# Patient Record
Sex: Male | Born: 1955 | Race: White | Hispanic: No | Marital: Married | Smoking: Never smoker
Health system: Southern US, Community
[De-identification: ages and names within clinical notes are randomized; demographics above are authoritative.]

## PROBLEM LIST (undated history)

## (undated) DIAGNOSIS — R011 Cardiac murmur, unspecified: Secondary | ICD-10-CM

## (undated) DIAGNOSIS — E1165 Type 2 diabetes mellitus with hyperglycemia: Secondary | ICD-10-CM

## (undated) DIAGNOSIS — E1121 Type 2 diabetes mellitus with diabetic nephropathy: Secondary | ICD-10-CM

## (undated) DIAGNOSIS — IMO0002 Reserved for concepts with insufficient information to code with codable children: Secondary | ICD-10-CM

## (undated) DIAGNOSIS — E1110 Type 2 diabetes mellitus with ketoacidosis without coma: Secondary | ICD-10-CM

## (undated) DIAGNOSIS — I5030 Unspecified diastolic (congestive) heart failure: Secondary | ICD-10-CM

## (undated) DIAGNOSIS — N183 Chronic kidney disease, stage 3 (moderate): Secondary | ICD-10-CM

## (undated) DIAGNOSIS — R079 Chest pain, unspecified: Secondary | ICD-10-CM

## (undated) DIAGNOSIS — T7840XA Allergy, unspecified, initial encounter: Secondary | ICD-10-CM

## (undated) DIAGNOSIS — E1140 Type 2 diabetes mellitus with diabetic neuropathy, unspecified: Secondary | ICD-10-CM

## (undated) DIAGNOSIS — R002 Palpitations: Secondary | ICD-10-CM

## (undated) DIAGNOSIS — I10 Essential (primary) hypertension: Secondary | ICD-10-CM

## (undated) DIAGNOSIS — I493 Ventricular premature depolarization: Secondary | ICD-10-CM

## (undated) DIAGNOSIS — Z8669 Personal history of other diseases of the nervous system and sense organs: Secondary | ICD-10-CM

## (undated) HISTORY — DX: Type 2 diabetes mellitus with diabetic neuropathy, unspecified: E11.40

## (undated) HISTORY — PX: FOOT SURGERY: SHX648

## (undated) HISTORY — DX: Morbid (severe) obesity due to excess calories: E66.01

## (undated) HISTORY — DX: Palpitations: R00.2

## (undated) HISTORY — DX: Chronic kidney disease, stage 3 (moderate): N18.3

## (undated) HISTORY — DX: Allergy, unspecified, initial encounter: T78.40XA

## (undated) HISTORY — DX: Type 2 diabetes mellitus with hyperglycemia: E11.65

## (undated) HISTORY — DX: Unspecified diastolic (congestive) heart failure: I50.30

## (undated) HISTORY — DX: Essential (primary) hypertension: I10

## (undated) HISTORY — DX: Reserved for concepts with insufficient information to code with codable children: IMO0002

## (undated) HISTORY — DX: Type 2 diabetes mellitus with diabetic nephropathy: E11.21

## (undated) HISTORY — DX: Cardiac murmur, unspecified: R01.1

## (undated) HISTORY — DX: Personal history of other diseases of the nervous system and sense organs: Z86.69

## (undated) HISTORY — DX: Chest pain, unspecified: R07.9

## (undated) HISTORY — DX: Ventricular premature depolarization: I49.3

---

## 2007-08-27 ENCOUNTER — Ambulatory Visit: Payer: Self-pay | Admitting: Family Medicine

## 2010-08-01 LAB — PSA: PSA: 0.1

## 2010-08-27 ENCOUNTER — Ambulatory Visit: Payer: Self-pay

## 2012-01-15 LAB — TSH: TSH: 2.65 u[IU]/mL (ref <=5.90)

## 2012-01-15 LAB — HEMOGLOBIN A1C: HEMOGLOBIN A1C: 5.7

## 2013-02-24 LAB — BASIC METABOLIC PANEL
BUN: 16 mg/dL (ref 4–21)
Creatinine: 1.3 mg/dL (ref 0.6–1.3)
Glucose: 81 mg/dL
POTASSIUM: 3.8 mmol/L (ref 3.4–5.3)
SODIUM: 141 mmol/L (ref 137–147)

## 2013-03-03 DIAGNOSIS — R252 Cramp and spasm: Secondary | ICD-10-CM | POA: Insufficient documentation

## 2013-03-03 DIAGNOSIS — K625 Hemorrhage of anus and rectum: Secondary | ICD-10-CM | POA: Insufficient documentation

## 2013-03-03 LAB — CBC AND DIFFERENTIAL
HCT: 42 % (ref 41–53)
Hemoglobin: 14.3 g/dL (ref 13.5–17.5)
Neutrophils Absolute: 5 /uL
Platelets: 268 10*3/uL (ref 150–399)
WBC: 9.2 10^3/mL

## 2013-06-30 LAB — LIPID PANEL
Cholesterol: 159 mg/dL (ref 0–200)
HDL: 29 mg/dL — AB (ref 35–70)
LDL Cholesterol: 97 mg/dL
Triglycerides: 163 mg/dL — AB (ref 40–160)

## 2013-06-30 LAB — HEPATIC FUNCTION PANEL
ALT: 20 U/L (ref 10–40)
AST: 19 U/L (ref 14–40)
Alkaline Phosphatase: 94 U/L (ref 25–125)
Bilirubin, Direct: 0.19 mg/dL (ref 0.01–0.4)
Bilirubin, Total: 0.7 mg/dL

## 2013-09-15 HISTORY — PX: OTHER SURGICAL HISTORY: SHX169

## 2014-01-31 DIAGNOSIS — G629 Polyneuropathy, unspecified: Secondary | ICD-10-CM

## 2014-01-31 HISTORY — DX: Polyneuropathy, unspecified: G62.9

## 2014-06-28 ENCOUNTER — Ambulatory Visit: Payer: Self-pay | Admitting: Nurse Practitioner

## 2014-06-28 LAB — SYNOVIAL CELL COUNT + DIFF, W/ CRYSTALS
BASOS ABS: 0 %
Crystals, Joint Fluid: NONE SEEN
Eosinophil: 0 %
Lymphocytes: 31 %
NEUTROS PCT: 2 %
Nucleated Cell Count: 273 /mm3
OTHER MONONUCLEAR CELLS: 67 %
Other Cells BF: 0 %

## 2014-07-04 LAB — BODY FLUID CULTURE

## 2016-01-02 DIAGNOSIS — R252 Cramp and spasm: Secondary | ICD-10-CM

## 2016-01-02 DIAGNOSIS — K625 Hemorrhage of anus and rectum: Secondary | ICD-10-CM

## 2016-01-02 DIAGNOSIS — I10 Essential (primary) hypertension: Secondary | ICD-10-CM

## 2016-01-02 DIAGNOSIS — M109 Gout, unspecified: Secondary | ICD-10-CM

## 2016-01-08 ENCOUNTER — Ambulatory Visit: Payer: Self-pay | Admitting: Urology

## 2016-01-08 VITALS — BP 147/78 | HR 67 | Wt 259.0 lb

## 2016-01-08 DIAGNOSIS — E785 Hyperlipidemia, unspecified: Secondary | ICD-10-CM

## 2016-01-08 DIAGNOSIS — E1159 Type 2 diabetes mellitus with other circulatory complications: Secondary | ICD-10-CM | POA: Insufficient documentation

## 2016-01-08 DIAGNOSIS — I10 Essential (primary) hypertension: Secondary | ICD-10-CM

## 2016-01-08 DIAGNOSIS — R351 Nocturia: Secondary | ICD-10-CM

## 2016-01-08 DIAGNOSIS — R7303 Prediabetes: Secondary | ICD-10-CM

## 2016-01-08 MED ORDER — AMLODIPINE BESYLATE 10 MG PO TABS: 10.0000 mg | ORAL_TABLET | Freq: Every day | ORAL | Status: DC

## 2016-01-08 MED ORDER — METOPROLOL TARTRATE 100 MG PO TABS: 100.0000 mg | ORAL_TABLET | Freq: Two times a day (BID) | ORAL | Status: DC

## 2016-01-08 MED ORDER — ATORVASTATIN CALCIUM 20 MG PO TABS: 20.0000 mg | ORAL_TABLET | Freq: Every day | ORAL | Status: DC

## 2016-01-08 MED ORDER — LOSARTAN POTASSIUM 50 MG PO TABS: 50.0000 mg | ORAL_TABLET | Freq: Every day | ORAL | Status: DC

## 2016-01-08 MED ORDER — ALLOPURINOL 100 MG PO TABS: 100.0000 mg | ORAL_TABLET | Freq: Every day | ORAL | Status: DC

## 2016-01-08 MED ORDER — SILODOSIN 8 MG PO CAPS: 8.0000 mg | ORAL_CAPSULE | Freq: Every day | ORAL | Status: DC

## 2016-01-08 MED ORDER — CLONIDINE HCL 0.1 MG PO TABS: 0.1000 mg | ORAL_TABLET | Freq: Two times a day (BID) | ORAL | Status: DC

## 2016-01-08 NOTE — Progress Notes (Signed)
       Patient: Steve Buck Male    DOB: November 27, 1955   60 y.o.   MRN: [id number] Visit Date: 01/08/2016  Today's Provider: [place]   Chief Complaint  Patient presents with  . Hypertension    needs refill of medications   Subjective:    Hypertension This is a chronic problem. The current episode started more than 1 year ago. The problem is unchanged. The problem is uncontrolled. Associated symptoms include peripheral edema. Pertinent negatives include no anxiety, blurred vision, chest pain, headaches, malaise/fatigue, neck pain, orthopnea, palpitations, PND, shortness of breath or sweats. There are no associated agents to hypertension. Risk factors for coronary artery disease include dyslipidemia, male gender, obesity, sedentary lifestyle and family history. Past treatments include beta blockers and calcium channel blockers. The current treatment provides moderate improvement. Compliance problems include medication cost.    Patient has not been seen at the [facility] for the last two years.  He had been seeing a PCP in a private practice.  He last saw Dr. Vickki Muff in November 2016.  Has not been out of medications.  He has not seen an eye doctor in one year.    Patient complains of nocturia x 3.  Has risk factors for sleep apnea.  Has not had a sleep study.      Allergies  Allergen Reactions  . Lisinopril    Previous Medications   ALLOPURINOL (ZYLOPRIM) 100 MG TABLET    Take 100 mg by mouth daily.   AMLODIPINE (NORVASC) 10 MG TABLET    Take 10 mg by mouth daily.   CLONIDINE (CATAPRES) 0.1 MG TABLET    Take 0.1 mg by mouth 2 (two) times daily.   LOSARTAN (COZAAR) 50 MG TABLET    Take 50 mg by mouth daily.   LOVASTATIN (MEVACOR) 20 MG TABLET    Take 20 mg by mouth at bedtime.   METOPROLOL (LOPRESSOR) 100 MG TABLET    Take 100 mg by mouth 2 (two) times daily.    Review of Systems  Constitutional: Negative for malaise/fatigue.  Eyes: Negative for blurred vision.    Respiratory: Negative for shortness of breath.   Cardiovascular: Negative for chest pain, palpitations, orthopnea and PND.  Musculoskeletal: Negative for neck pain.  Neurological: Negative for headaches.    Social History  Substance Use Topics  . Smoking status: Never Smoker   . Smokeless tobacco: Not on file  . Alcohol Use: Yes   Objective:   BP 147/78 mmHg  Pulse 67  Wt 259 lb (117.482 kg)  Physical Exam  Constitutional: He appears well-developed and well-nourished.  Cardiovascular: Normal rate, regular rhythm and normal heart sounds.   Pulmonary/Chest: Effort normal and breath sounds normal.        Assessment & Plan:     1. HTN:  -meds refilled  -check TSH, Lipids, CMP, Homocysteine, HbgA1c, CBC  -follow up one month  2. HLD:   -check lipids  -follow up one month  3. Nocturia:  -start Rapaflo  -check PSA  -follow up one month   -if no improvement, consider sleep study, [place] patient have a difficult time getting CPAP machines      [facility]

## 2016-01-24 ENCOUNTER — Ambulatory Visit: Payer: Self-pay

## 2016-02-12 ENCOUNTER — Other Ambulatory Visit: Payer: Self-pay

## 2016-02-12 DIAGNOSIS — R7303 Prediabetes: Secondary | ICD-10-CM

## 2016-02-12 DIAGNOSIS — E785 Hyperlipidemia, unspecified: Secondary | ICD-10-CM

## 2016-02-12 DIAGNOSIS — I10 Essential (primary) hypertension: Secondary | ICD-10-CM

## 2016-02-14 LAB — COMPREHENSIVE METABOLIC PANEL
ALK PHOS: 84 IU/L (ref 39–117)
ALT: 15 IU/L (ref 0–44)
AST: 19 IU/L (ref 0–40)
Albumin/Globulin Ratio: 1.3 (ref 1.2–2.2)
Albumin: 3.9 g/dL (ref 3.6–4.8)
BILIRUBIN TOTAL: 0.7 mg/dL (ref 0.0–1.2)
BUN/Creatinine Ratio: 15 (ref 10–24)
BUN: 18 mg/dL (ref 8–27)
CHLORIDE: 99 mmol/L (ref 96–106)
CO2: 25 mmol/L (ref 18–29)
Calcium: 9.2 mg/dL (ref 8.6–10.2)
Creatinine, Ser: 1.22 mg/dL (ref 0.76–1.27)
GFR calc Af Amer: 74 mL/min/{1.73_m2} (ref >59)
GFR calc non Af Amer: 64 mL/min/{1.73_m2} (ref >59)
GLUCOSE: 95 mg/dL (ref 65–99)
Globulin, Total: 3.1 g/dL (ref 1.5–4.5)
Potassium: 4 mmol/L (ref 3.5–5.2)
Sodium: 141 mmol/L (ref 134–144)
TOTAL PROTEIN: 7 g/dL (ref 6.0–8.5)

## 2016-02-14 LAB — TSH: TSH: 5.9 u[IU]/mL — ABNORMAL HIGH (ref 0.450–4.500)

## 2016-02-14 LAB — PSA, TOTAL AND FREE
PSA FREE PCT: 46.7 %
PSA FREE: 0.14 ng/mL
Prostate Specific Ag, Serum: 0.3 ng/mL (ref 0.0–4.0)

## 2016-02-14 LAB — LIPID PANEL
CHOL/HDL RATIO: 5.4 ratio — AB (ref 0.0–5.0)
Cholesterol, Total: 135 mg/dL (ref 100–199)
HDL: 25 mg/dL — ABNORMAL LOW (ref >39)
LDL CALC: 62 mg/dL (ref 0–99)
TRIGLYCERIDES: 242 mg/dL — AB (ref 0–149)
VLDL Cholesterol Cal: 48 mg/dL — ABNORMAL HIGH (ref 5–40)

## 2016-02-14 LAB — CBC WITH DIFFERENTIAL/PLATELET
BASOS ABS: 0 10*3/uL (ref 0.0–0.2)
Basos: 0 %
EOS (ABSOLUTE): 0.3 10*3/uL (ref 0.0–0.4)
Eos: 3 %
Hematocrit: 41.6 % (ref 37.5–51.0)
Hemoglobin: 13.6 g/dL (ref 12.6–17.7)
IMMATURE GRANS (ABS): 0 10*3/uL (ref 0.0–0.1)
Immature Granulocytes: 0 %
LYMPHS: 36 %
Lymphocytes Absolute: 3.4 10*3/uL — ABNORMAL HIGH (ref 0.7–3.1)
MCH: 31.6 pg (ref 26.6–33.0)
MCHC: 32.7 g/dL (ref 31.5–35.7)
MCV: 97 fL (ref 79–97)
Monocytes Absolute: 0.8 10*3/uL (ref 0.1–0.9)
Monocytes: 8 %
NEUTROS ABS: 5 10*3/uL (ref 1.4–7.0)
NEUTROS PCT: 53 %
PLATELETS: 224 10*3/uL (ref 150–379)
RBC: 4.31 x10E6/uL (ref 4.14–5.80)
RDW: 14.7 % (ref 12.3–15.4)
WBC: 9.5 10*3/uL (ref 3.4–10.8)

## 2016-02-14 LAB — HOMOCYSTEINE: Homocysteine: 16.1 umol/L — ABNORMAL HIGH (ref 0.0–15.0)

## 2016-02-14 LAB — HEMOGLOBIN A1C
Est. average glucose Bld gHb Est-mCnc: 160 mg/dL
HEMOGLOBIN A1C: 7.2 % — AB (ref 4.8–5.6)

## 2016-02-15 ENCOUNTER — Telehealth: Payer: Self-pay

## 2016-02-15 DIAGNOSIS — E039 Hypothyroidism, unspecified: Secondary | ICD-10-CM

## 2016-02-15 MED ORDER — LEVOTHYROXINE SODIUM 50 MCG PO TABS: 50.0000 ug | ORAL_TABLET | Freq: Every day | ORAL | Status: DC

## 2016-02-15 NOTE — Progress Notes (Signed)
I called patient scheduled his lab follow up and sent the RX to [facility] for Synthroid 50 mcg daily. Patient has an appt on 02/19/16 encouraged him to ask questions about the new medication at that time.

## 2016-02-17 NOTE — Telephone Encounter (Signed)
It wasn't in my co-sign box.

## 2016-02-19 ENCOUNTER — Ambulatory Visit: Payer: Self-pay | Admitting: Urology

## 2016-02-19 VITALS — BP 138/74 | HR 61 | Temp 98.2°F | Wt 308.0 lb

## 2016-02-19 DIAGNOSIS — E039 Hypothyroidism, unspecified: Secondary | ICD-10-CM

## 2016-02-19 MED ORDER — LEVOTHYROXINE SODIUM 50 MCG PO TABS: 50.0000 ug | ORAL_TABLET | Freq: Every day | ORAL | Status: DC

## 2016-02-19 NOTE — Progress Notes (Signed)
Patient: Steve Buck Male    DOB: 10/30/1955   60 y.o.   MRN: [id number] Visit Date: 02/19/2016  Today's Provider: [place]   Chief Complaint  Patient presents with  . Follow-up    labwork    Subjective:    Hypertension This is a chronic problem. The current episode started more than 1 year ago. The problem is unchanged. The problem is uncontrolled. Associated symptoms include peripheral edema. Pertinent negatives include no anxiety, blurred vision, chest pain, headaches, malaise/fatigue, neck pain, orthopnea, palpitations, PND, shortness of breath or sweats. There are no associated agents to hypertension. Risk factors for coronary artery disease include dyslipidemia, male gender, obesity, sedentary lifestyle and family history. Past treatments include beta blockers and calcium channel blockers. The current treatment provides moderate improvement. Compliance problems include medication cost.    Patient has not been seen at the [facility] for the last two years.  He had been seeing a PCP in a private practice.  He last saw Dr. Vickki Muff in November 2016.  Has not been out of medications.  He has not seen an eye doctor in one year.    Patient complains of nocturia x 3.  Has risk factors for sleep apnea.  Has not had a sleep study.  Rapaflo was not helpful.  He experienced ejaculatory disorders.    Wife states he has "funky looking" moles on his back.    Allergies  Allergen Reactions  . Lisinopril    Previous Medications   ALLOPURINOL (ZYLOPRIM) 100 MG TABLET    Take 1 tablet (100 mg total) by mouth daily.   AMLODIPINE (NORVASC) 10 MG TABLET    Take 1 tablet (10 mg total) by mouth daily.   ATORVASTATIN (LIPITOR) 20 MG TABLET    Take 1 tablet (20 mg total) by mouth daily.   CLONIDINE (CATAPRES) 0.1 MG TABLET    Take 1 tablet (0.1 mg total) by mouth 2 (two) times daily.   LEVOTHYROXINE (SYNTHROID, LEVOTHROID) 50 MCG TABLET    Take 1 tablet (50 mcg total) by mouth daily  before breakfast.   LOSARTAN (COZAAR) 50 MG TABLET    Take 1 tablet (50 mg total) by mouth daily.   LOVASTATIN (MEVACOR) 20 MG TABLET    Take 20 mg by mouth at bedtime. Reported on 02/19/2016   METOPROLOL (LOPRESSOR) 100 MG TABLET    Take 1 tablet (100 mg total) by mouth 2 (two) times daily.   SILODOSIN (RAPAFLO) 8 MG CAPS CAPSULE    Take 1 capsule (8 mg total) by mouth daily with breakfast.    Review of Systems  Constitutional: Negative for malaise/fatigue.  Eyes: Negative for blurred vision.  Respiratory: Negative for shortness of breath.   Cardiovascular: Negative for chest pain, palpitations, orthopnea and PND.  Musculoskeletal: Negative for neck pain.  Neurological: Negative for headaches.    Social History  Substance Use Topics  . Smoking status: Never Smoker   . Smokeless tobacco: Not on file  . Alcohol Use: Yes   Objective:   BP 138/74 mmHg  Pulse 61  Temp(Src) 98.2 F (36.8 C)  Wt 308 lb (139.708 kg)  Physical Exam  Constitutional: He appears well-developed and well-nourished.  Cardiovascular: Normal rate, regular rhythm and normal heart sounds.   Pulmonary/Chest: Effort normal and breath sounds normal.   Back:  Three moles of new onset; irregular and multi-colored     Assessment & Plan:     1. HTN:  -meds refilled  -  check TSH was 5.900-start synthroid, Lipids, CMP wnl, Homocysteine elevated at 16.1 , HbgA1c 7.2%, CBC wnl  -follow up one month  2. HLD:   -check lipids; elevated; hypothyroidism start Synthroid; recheck in 6 weeks   3. Nocturia:  -start Rapaflo; no improvement  -check PSA  -if no improvement, consider sleep study, [place] patient have a difficult time getting CPAP machines, will not do sleep study    4. Hypothyroidism:  -start synthroid 50 mcg and recheck TSH in 6 weeks  5. Suspicious moles;  Refer to dermatology   [facility]

## 2016-04-01 ENCOUNTER — Other Ambulatory Visit: Payer: Self-pay

## 2016-04-03 ENCOUNTER — Other Ambulatory Visit: Payer: Self-pay

## 2016-04-03 DIAGNOSIS — E038 Other specified hypothyroidism: Secondary | ICD-10-CM

## 2016-04-04 LAB — TSH: TSH: 1.71 u[IU]/mL (ref 0.450–4.500)

## 2017-01-07 ENCOUNTER — Other Ambulatory Visit: Payer: Self-pay

## 2017-01-07 DIAGNOSIS — I10 Essential (primary) hypertension: Secondary | ICD-10-CM

## 2017-01-07 MED ORDER — METOPROLOL TARTRATE 100 MG PO TABS: 100.0000 mg | ORAL_TABLET | Freq: Two times a day (BID) | ORAL | 0 refills | Status: DC

## 2017-01-13 ENCOUNTER — Other Ambulatory Visit: Payer: Self-pay

## 2017-01-13 DIAGNOSIS — I10 Essential (primary) hypertension: Secondary | ICD-10-CM

## 2017-01-13 MED ORDER — METOPROLOL TARTRATE 100 MG PO TABS: 100.0000 mg | ORAL_TABLET | Freq: Two times a day (BID) | ORAL | 0 refills | Status: DC

## 2017-01-13 MED ORDER — CLONIDINE HCL 0.1 MG PO TABS: 0.1000 mg | ORAL_TABLET | Freq: Two times a day (BID) | ORAL | 0 refills | Status: DC

## 2017-01-15 ENCOUNTER — Emergency Department: Payer: Self-pay

## 2017-01-15 ENCOUNTER — Emergency Department
Admission: EM | Admit: 2017-01-15 | Discharge: 2017-01-15 | Disposition: A | Discharge status: Home / Self Care | Payer: Self-pay | Attending: Emergency Medicine | Admitting: Emergency Medicine

## 2017-01-15 DIAGNOSIS — Y999 Unspecified external cause status: Secondary | ICD-10-CM | POA: Insufficient documentation

## 2017-01-15 DIAGNOSIS — Y939 Activity, unspecified: Secondary | ICD-10-CM | POA: Insufficient documentation

## 2017-01-15 DIAGNOSIS — S60221A Contusion of right hand, initial encounter: Secondary | ICD-10-CM

## 2017-01-15 DIAGNOSIS — I110 Hypertensive heart disease with heart failure: Secondary | ICD-10-CM | POA: Insufficient documentation

## 2017-01-15 DIAGNOSIS — W228XXA Striking against or struck by other objects, initial encounter: Secondary | ICD-10-CM | POA: Insufficient documentation

## 2017-01-15 DIAGNOSIS — Y929 Unspecified place or not applicable: Secondary | ICD-10-CM | POA: Insufficient documentation

## 2017-01-15 DIAGNOSIS — I10 Essential (primary) hypertension: Secondary | ICD-10-CM

## 2017-01-15 DIAGNOSIS — S62646A Nondisplaced fracture of proximal phalanx of right little finger, initial encounter for closed fracture: Secondary | ICD-10-CM | POA: Insufficient documentation

## 2017-01-15 DIAGNOSIS — I509 Heart failure, unspecified: Secondary | ICD-10-CM | POA: Insufficient documentation

## 2017-01-15 MED ORDER — NAPROXEN 500 MG PO TABS: 500.0000 mg | ORAL_TABLET | Freq: Two times a day (BID) | ORAL | 0 refills | Status: DC

## 2017-01-15 NOTE — ED Notes (Signed)
See triage note  States he hit his right hand on a pallet on Monday  Developed increased pain yesterday

## 2017-01-15 NOTE — ED Provider Notes (Signed)
[facility] Emergency Department Provider Note   ____________________________________________   First MD Initiated Contact with Patient 01/15/17 [phone]     (approximate)  I have reviewed the triage vital signs and the nursing notes.   HISTORY  Chief Complaint Hand Pain    HPI Steve Buck is a 61 y.o. male is here complaining of right hand pain for the last 3 days. Patient thinks that he hit a pallet on Monday but is not completely sure. 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 He has continued to have pain in his hand since that time. He took over-the-counter ibuprofen with minimal relief. He denies any previous injury to his hand. He rates pain as a 10 over 10.   Past Medical History:  Diagnosis Date  . Allergy    Seasonal  . CHF (congestive heart failure) ([place])   . Hypertension     Patient Active Problem List   Diagnosis Date Noted  . Pre-diabetes 01/08/2016  . Essential hypertension 01/08/2016  . Hyperlipemia 01/08/2016  . Nocturia 01/08/2016  . Hypertension 03/03/2013  . Rectal bleeding 03/03/2013  . Leg cramps 03/03/2013  . Gout 11/25/2012    Past Surgical History:  Procedure Laterality Date  . FOOT SURGERY  around 1994    Prior to Admission medications   Medication Sig Start Date End Date Taking? Authorizing Provider  cloNIDine (CATAPRES) 0.1 MG tablet Take 1 tablet (0.1 mg total) by mouth 2 (two) times daily. 01/13/17   Nori Riis, PA-C  lovastatin (MEVACOR) 20 MG tablet Take 20 mg by mouth at bedtime. Reported on 02/19/2016    Historical Provider, MD  metoprolol (LOPRESSOR) 100 MG tablet Take 1 tablet (100 mg total) by mouth 2 (two) times daily. 01/13/17   Nori Riis, PA-C  naproxen (NAPROSYN) 500 MG tablet Take 1 tablet (500 mg total) by mouth 2 (two) times daily with a meal. 01/15/17   Johnn Hai, PA-C    Allergies Lisinopril  Family History  Problem Relation Age  of Onset  . Cancer Mother     Skin  . CAD Father   . Arthritis Father     Social History Social History  Substance Use Topics  . Smoking status: Never Smoker  . Smokeless tobacco: Not on file  . Alcohol use Yes    Review of Systems Constitutional: No fever/chills ENT: No sore throat. Cardiovascular: Denies chest pain. Respiratory: Denies shortness of breath. Gastrointestinal:   No nausea, no vomiting.   Musculoskeletal: Positive right hand pain. Skin: Negative for rash. Neurological: Negative for headaches, focal weakness or numbness.   ____________________________________________   PHYSICAL EXAM:  VITAL SIGNS: ED Triage Vitals  Enc Vitals Group     BP 01/15/17 0717 (!) 145/107     Pulse Rate 01/15/17 0717 (!) 56     Resp 01/15/17 0717 18     Temp 01/15/17 0717 97.9 F (36.6 C)     Temp Source 01/15/17 0717 Oral     SpO2 01/15/17 0717 97 %     Weight 01/15/17 0718 (!) 315 lb (142.9 kg)     Height 01/15/17 0718 6' (1.829 m)     Head Circumference --      Peak Flow --      Pain Score 01/15/17 0717 10     Pain Loc --      Pain Edu? --      Excl. in [place]? --  Constitutional: Alert and oriented. Well appearing and in no acute distress. Eyes: Conjunctivae are normal. PERRL. EOMI. Head: Atraumatic. Neck: No stridor.   Cardiovascular: Normal rate, regular rhythm. Grossly normal heart sounds.  Good peripheral circulation. Respiratory: Normal respiratory effort.  No retractions. Lungs CTAB. Musculoskeletal: On examination of the right hand there is no gross deformity noted. Range of motion is decreased in the digits secondary to discomfort. There is generalized tenderness on palpation of the third, fourth and fifth digit along with the metacarpal bones in this area as well. Skin is intact. No ecchymosis or abrasions were seen. Capillary refill less than 3 seconds. Motor sensory function intact. Neurologic:  Normal speech and language. No gross focal neurologic  deficits are appreciated. No gait instability. Skin:  Skin is warm, dry and intact. No rash noted. Psychiatric: Mood and affect are normal. Speech and behavior are normal.  ____________________________________________   LABS (all labs ordered are listed, but only abnormal results are displayed)  Labs Reviewed - No data to display  RADIOLOGY  Right hand x-ray per radiologist: IMPRESSION:  Nondisplaced fracture distal aspect fifth proximal phalanx. No other  evidence of fracture. No dislocation. 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   I, Johnn Hai, personally viewed and evaluated these images (plain radiographs) as part of my medical decision making, as well as reviewing the written report by the radiologist.  Right wrist x-ray per radiologist is negative for fracture or dislocation. ____________________________________________   PROCEDURES  Procedure(s) performed: None  Procedures  Critical Care performed: No  ____________________________________________   INITIAL IMPRESSION / ASSESSMENT AND PLAN / ED COURSE  Pertinent labs & imaging results that were available during my care of the patient were reviewed by me and considered in my medical decision making (see chart for details).  Patient states that he has been out of his blood pressure medication for approximately one week. He states that he only started taking it again a few days ago. [facility] He was made aware that he does have fracture of his fifth digit. Patient was placed in a metal finger splint for protection. Patient was discharged with a prescription for naproxen 500 mg twice a day. He is encouraged to continue taking his blood pressure medication every day.      ____________________________________________   FINAL CLINICAL IMPRESSION(S) / ED DIAGNOSES  Final diagnoses:  Closed nondisplaced fracture of proximal phalanx of right little finger, initial  encounter  Contusion of right hand, initial encounter  Hypertension, poor control      NEW MEDICATIONS STARTED DURING THIS VISIT:  Discharge Medication List as of 01/15/2017  9:10 AM    START taking these medications   Details  naproxen (NAPROSYN) 500 MG tablet Take 1 tablet (500 mg total) by mouth 2 (two) times daily with a meal., Starting Thu 01/15/2017, Print         Note:  This document was prepared using Dragon voice recognition software and may include unintentional dictation errors.    Johnn Hai, PA-C 01/15/17 [place], MD 01/15/17 [phone]

## 2017-01-15 NOTE — Discharge Instructions (Signed)
Wear splint for support and [facility]  about your blood pressure.   Ice and elevate your hand as needed for pain/   Naproxen twice a day with food for pain and inflammation.

## 2017-01-15 NOTE — ED Triage Notes (Signed)
Pt arrives to ER via POC c/o right hand pain X 3 days after hitting it on desk. No obvious bruising or deformity. Worsening pain over last few days. Pt alert and oriented X4, active, cooperative, pt in NAD. RR even and unlabored, color WNL.

## 2017-01-20 ENCOUNTER — Ambulatory Visit: Payer: Self-pay | Admitting: Adult Health Nurse Practitioner

## 2017-01-20 VITALS — BP 150/85 | HR 72 | Temp 98.1°F | Wt 314.7 lb

## 2017-01-20 DIAGNOSIS — M109 Gout, unspecified: Secondary | ICD-10-CM

## 2017-01-20 DIAGNOSIS — Z0000 Encounter for general adult medical examination without abnormal findings: Secondary | ICD-10-CM

## 2017-01-20 DIAGNOSIS — I10 Essential (primary) hypertension: Secondary | ICD-10-CM

## 2017-01-20 MED ORDER — METOPROLOL TARTRATE 100 MG PO TABS: 100.0000 mg | ORAL_TABLET | Freq: Two times a day (BID) | ORAL | 2 refills | Status: DC

## 2017-01-20 MED ORDER — CLONIDINE HCL 0.2 MG PO TABS: 0.1000 mg | ORAL_TABLET | Freq: Two times a day (BID) | ORAL | 3 refills | Status: DC

## 2017-01-20 MED ORDER — ALLOPURINOL 100 MG PO TABS: 100.0000 mg | ORAL_TABLET | Freq: Every day | ORAL | 3 refills | Status: DC

## 2017-01-20 NOTE — Progress Notes (Signed)
  Patient: Steve Buck Male    DOB: [birth year]   61 y.o.   MRN: [id number] Visit Date: 01/20/2017  Today's Provider: Staci Acosta, NP   No chief complaint on file.  Subjective:    HPI   Pt seen in the ER for nondisplaced fracture distal aspect of the 5th proximal phalanx.  On naproxen for pain.  Pt states that he thinks it was gout flare.  Not taking allopurinol.   HTN/HLD:  Taking clonidine 0.1mg  BID and Metoprolol 100mg  BID- has not had medications for 5 days.  Not taking lovastatin.  Not monitoring diet or exercise.   Pt states that he is having nocturia, 5-6 times per night.   Allergies  Allergen Reactions  . Lisinopril    Previous Medications   ALLOPURINOL (ZYLOPRIM) 100 MG TABLET    Take 100 mg by mouth daily.   CLONIDINE (CATAPRES) 0.1 MG TABLET    Take 1 tablet (0.1 mg total) by mouth 2 (two) times daily.   LOVASTATIN (MEVACOR) 20 MG TABLET    Take 20 mg by mouth at bedtime. Reported on 02/19/2016   METOPROLOL (LOPRESSOR) 100 MG TABLET    Take 1 tablet (100 mg total) by mouth 2 (two) times daily.   NAPROXEN (NAPROSYN) 500 MG TABLET    Take 1 tablet (500 mg total) by mouth 2 (two) times daily with a meal.    Review of Systems  All other systems reviewed and are negative.   Social History  Substance Use Topics  . Smoking status: Never Smoker  . Smokeless tobacco: Never Used  . Alcohol use Yes   Objective:   BP (!) 150/85   Pulse 72   Temp 98.1 F (36.7 C)   Wt (!) 314 lb 11.2 oz (142.7 kg)   BMI 42.68 kg/m   Physical Exam  Constitutional: He is oriented to person, place, and time. He appears well-developed and well-nourished.  HENT:  Head: Normocephalic and atraumatic.  Eyes: Pupils are equal, round, and reactive to light.  Neck: Normal range of motion. Neck supple.  Cardiovascular: Normal rate, regular rhythm and normal heart sounds.   Pulmonary/Chest: Effort normal and breath sounds normal.  Abdominal: Soft. Bowel sounds are normal.   Neurological: He is alert and oriented to person, place, and time.  Skin: Skin is warm and dry.  Vitals reviewed.       Assessment & Plan:          HTN:  Not controlled.  Goal BP <140/90.  Increase clonidine to 0.2mg  BID, continue metoprolol.  Encourage low salt diet and exercise.   GOUT:  restart Allopurinol.   Routine labs ordered- including PSA for nocturia.  FU in 2 weeks for lab review and BP check.      Staci Acosta, NP   [facility]

## 2017-01-21 LAB — LIPID PANEL
Chol/HDL Ratio: 7.7 ratio — ABNORMAL HIGH (ref 0.0–5.0)
Cholesterol, Total: 177 mg/dL (ref 100–199)
HDL: 23 mg/dL — ABNORMAL LOW (ref >39)
LDL CALC: 88 mg/dL (ref 0–99)
Triglycerides: 328 mg/dL — ABNORMAL HIGH (ref 0–149)
VLDL CHOLESTEROL CAL: 66 mg/dL — AB (ref 5–40)

## 2017-01-21 LAB — CBC
Hematocrit: 41.4 % (ref 37.5–51.0)
Hemoglobin: 13.9 g/dL (ref 13.0–17.7)
MCH: 31.4 pg (ref 26.6–33.0)
MCHC: 33.6 g/dL (ref 31.5–35.7)
MCV: 94 fL (ref 79–97)
PLATELETS: 245 10*3/uL (ref 150–379)
RBC: 4.42 x10E6/uL (ref 4.14–5.80)
RDW: 14.6 % (ref 12.3–15.4)
WBC: 8.2 10*3/uL (ref 3.4–10.8)

## 2017-01-21 LAB — COMPREHENSIVE METABOLIC PANEL
ALBUMIN: 3.9 g/dL (ref 3.6–4.8)
ALT: 17 IU/L (ref 0–44)
AST: 20 IU/L (ref 0–40)
Albumin/Globulin Ratio: 1.3 (ref 1.2–2.2)
Alkaline Phosphatase: 79 IU/L (ref 39–117)
BILIRUBIN TOTAL: 0.6 mg/dL (ref 0.0–1.2)
BUN / CREAT RATIO: 18 (ref 10–24)
BUN: 33 mg/dL — AB (ref 8–27)
CO2: 28 mmol/L (ref 18–29)
CREATININE: 1.86 mg/dL — AB (ref 0.76–1.27)
Calcium: 9.2 mg/dL (ref 8.6–10.2)
Chloride: 96 mmol/L (ref 96–106)
GFR calc non Af Amer: 38 mL/min/{1.73_m2} — ABNORMAL LOW (ref >59)
GFR, EST AFRICAN AMERICAN: 44 mL/min/{1.73_m2} — AB (ref >59)
GLOBULIN, TOTAL: 3 g/dL (ref 1.5–4.5)
GLUCOSE: 89 mg/dL (ref 65–99)
Potassium: 3.8 mmol/L (ref 3.5–5.2)
SODIUM: 139 mmol/L (ref 134–144)
TOTAL PROTEIN: 6.9 g/dL (ref 6.0–8.5)

## 2017-01-21 LAB — HEMOGLOBIN A1C
ESTIMATED AVERAGE GLUCOSE: 140 mg/dL
HEMOGLOBIN A1C: 6.5 % — AB (ref 4.8–5.6)

## 2017-01-21 LAB — URIC ACID: Uric Acid: 11.3 mg/dL — ABNORMAL HIGH (ref 3.7–8.6)

## 2017-01-21 LAB — PSA: PROSTATE SPECIFIC AG, SERUM: 0.3 ng/mL (ref 0.0–4.0)

## 2017-01-21 LAB — TSH: TSH: 3.02 u[IU]/mL (ref 0.450–4.500)

## 2017-02-17 ENCOUNTER — Ambulatory Visit: Payer: Self-pay | Admitting: Adult Health Nurse Practitioner

## 2017-02-17 VITALS — BP 158/98 | HR 57 | Temp 98.1°F | Ht 73.0 in | Wt 304.5 lb

## 2017-02-17 DIAGNOSIS — I10 Essential (primary) hypertension: Secondary | ICD-10-CM

## 2017-02-17 MED ORDER — HYDROCHLOROTHIAZIDE 25 MG PO TABS: 25.0000 mg | ORAL_TABLET | Freq: Every day | ORAL | 3 refills | Status: DC

## 2017-02-17 MED ORDER — AMOXICILLIN-POT CLAVULANATE 875-125 MG PO TABS: 1.0000 | ORAL_TABLET | Freq: Two times a day (BID) | ORAL | 0 refills | Status: DC

## 2017-02-17 MED ORDER — CLONIDINE HCL 0.2 MG PO TABS: 0.2000 mg | ORAL_TABLET | Freq: Two times a day (BID) | ORAL | 3 refills | Status: DC

## 2017-02-17 NOTE — Addendum Note (Signed)
Addended by: Reino Kent on: 02/17/2017 07:37 PM   Modules accepted: Orders

## 2017-02-17 NOTE — Progress Notes (Signed)
  Patient: Steve Buck Male    DOB: 1956/01/06   61 y.o.   MRN: [id number] Visit Date: 02/17/2017  Today's Provider: Staci Acosta, NP   Chief Complaint  Patient presents with  . Follow-up  . Headache   Subjective:    HPI     HTN:  BP is not improved from last visit.  Pt is taking 0.2 Clonidine BID and Metoprolol 100mg  BID.  Last visit BP was 150/85.    Pt states that he has had a headache since Sunday.  Denies dizziness.  Has hx of migraines years ago.  Hot shower makes it feel better.    Allergies  Allergen Reactions  . Lisinopril    Previous Medications   ALLOPURINOL (ZYLOPRIM) 100 MG TABLET    Take 1 tablet (100 mg total) by mouth daily.   CLONIDINE (CATAPRES) 0.2 MG TABLET    Take 0.5 tablets (0.1 mg total) by mouth 2 (two) times daily.   LOVASTATIN (MEVACOR) 20 MG TABLET    Take 20 mg by mouth at bedtime. Reported on 02/19/2016   METOPROLOL (LOPRESSOR) 100 MG TABLET    Take 1 tablet (100 mg total) by mouth 2 (two) times daily.   NAPROXEN (NAPROSYN) 500 MG TABLET    Take 1 tablet (500 mg total) by mouth 2 (two) times daily with a meal.    Review of Systems  All other systems reviewed and are negative.   Social History  Substance Use Topics  . Smoking status: Never Smoker  . Smokeless tobacco: Never Used  . Alcohol use Yes   Objective:   BP (!) 158/98   Pulse (!) 57   Temp 98.1 F (36.7 C)   Ht 6\' 1"  (1.854 m)   Wt (!) 304 lb 8 oz (138.1 kg)   BMI 40.17 kg/m   Physical Exam  Constitutional: He appears well-developed and well-nourished.  HENT:  Nose: Right sinus exhibits frontal sinus tenderness. Left sinus exhibits frontal sinus tenderness.  Cardiovascular: Normal rate, regular rhythm and normal heart sounds.   Pulmonary/Chest: Effort normal and breath sounds normal.  Abdominal: Soft. Bowel sounds are normal.  Lymphadenopathy:    He has no cervical adenopathy.  Vitals reviewed.       Assessment & Plan:     HTN:   Not controlled.   Goal BP <140/80.  Continue current medication regimen and add HCTZ 25mg  daily.  Encourage low salt diet and exercise.  FU in 4 weeks with repeat BMET for kidney stability.     Sinusitis:  Augmentin BID x 5 days.  Zyrtec nightly x 14 days.  Rhinocort nasal spray daily.  Increase PO fluids.    Staci Acosta, NP   [facility]

## 2017-03-17 ENCOUNTER — Ambulatory Visit: Payer: Self-pay | Admitting: Adult Health Nurse Practitioner

## 2017-03-17 VITALS — BP 181/84 | HR 59 | Temp 98.3°F | Ht 73.0 in | Wt 307.1 lb

## 2017-03-17 DIAGNOSIS — M109 Gout, unspecified: Secondary | ICD-10-CM

## 2017-03-17 DIAGNOSIS — I10 Essential (primary) hypertension: Secondary | ICD-10-CM

## 2017-03-17 MED ORDER — ALLOPURINOL 100 MG PO TABS: 100.0000 mg | ORAL_TABLET | Freq: Two times a day (BID) | ORAL | 3 refills | Status: DC

## 2017-03-17 MED ORDER — NAPROXEN 500 MG PO TABS: 500.0000 mg | ORAL_TABLET | Freq: Two times a day (BID) | ORAL | 0 refills | Status: DC

## 2017-03-17 NOTE — Progress Notes (Signed)
  Patient: Steve Buck Male    DOB: 06-18-1956   61 y.o.   MRN: [id number] Visit Date: 03/17/2017  Today's Provider: Staci Acosta, NP   Chief Complaint  Patient presents with  . Gout   Subjective:    HPI    Last visit BP was 158/98.  HCTZ 25mg  daily was added.  Pt states he has been off of the medication x 4 days, just resumed medication today.    Pt states that his gout has worsened.  He has had two flares within the last week and would like the allopurinol increased.  Pt states he used OTC remedies with good success.      Allergies  Allergen Reactions  . Lisinopril    Previous Medications   ALLOPURINOL (ZYLOPRIM) 100 MG TABLET    Take 1 tablet (100 mg total) by mouth daily.   AMOXICILLIN-CLAVULANATE (AUGMENTIN) 875-125 MG TABLET    Take 1 tablet by mouth 2 (two) times daily.   CLONIDINE (CATAPRES) 0.2 MG TABLET    Take 1 tablet (0.2 mg total) by mouth 2 (two) times daily.   HYDROCHLOROTHIAZIDE (HYDRODIURIL) 25 MG TABLET    Take 1 tablet (25 mg total) by mouth daily.   LOVASTATIN (MEVACOR) 20 MG TABLET    Take 20 mg by mouth at bedtime. Reported on 02/19/2016   METOPROLOL (LOPRESSOR) 100 MG TABLET    Take 1 tablet (100 mg total) by mouth 2 (two) times daily.   NAPROXEN (NAPROSYN) 500 MG TABLET    Take 1 tablet (500 mg total) by mouth 2 (two) times daily with a meal.    Review of Systems  All other systems reviewed and are negative.   Social History  Substance Use Topics  . Smoking status: Never Smoker  . Smokeless tobacco: Never Used  . Alcohol use Yes   Objective:   BP (!) 181/84   Pulse (!) 59   Temp 98.3 F (36.8 C)   Ht 6\' 1"  (1.854 m)   Wt (!) 307 lb 1.6 oz (139.3 kg)   BMI 40.52 kg/m   Physical Exam  Constitutional: He appears well-developed and well-nourished.  HENT:  Head: Normocephalic and atraumatic.  Cardiovascular: Normal rate, regular rhythm and normal heart sounds.   Pulmonary/Chest: Effort normal and breath sounds normal.  Vitals  reviewed.       Assessment & Plan:       HTN: BP uncontrolled.  Goal <140/80.    FU on Tuesday for labs and BP check.    Gout Increase Allopurinol to 100mg  BID.  Given Naproxen 500mg  to take when gout flares.  Discussed alternative therapies.     Staci Acosta, NP   [facility]

## 2017-03-24 ENCOUNTER — Other Ambulatory Visit: Payer: Self-pay | Admitting: Adult Health Nurse Practitioner

## 2017-03-24 ENCOUNTER — Other Ambulatory Visit: Payer: Self-pay

## 2017-03-24 VITALS — BP 196/92

## 2017-03-24 DIAGNOSIS — I10 Essential (primary) hypertension: Principal | ICD-10-CM

## 2017-03-24 DIAGNOSIS — E1159 Type 2 diabetes mellitus with other circulatory complications: Secondary | ICD-10-CM

## 2017-03-24 MED ORDER — ALLOPURINOL 100 MG PO TABS: 100.0000 mg | ORAL_TABLET | Freq: Two times a day (BID) | ORAL | 3 refills | Status: DC

## 2017-03-24 NOTE — Progress Notes (Unsigned)
bmt  

## 2017-03-25 LAB — BASIC METABOLIC PANEL
BUN / CREAT RATIO: 16 (ref 10–24)
BUN: 30 mg/dL — AB (ref 8–27)
CALCIUM: 9.2 mg/dL (ref 8.6–10.2)
CHLORIDE: 97 mmol/L (ref 96–106)
CO2: 28 mmol/L (ref 20–29)
CREATININE: 1.85 mg/dL — AB (ref 0.76–1.27)
GFR, EST AFRICAN AMERICAN: 44 mL/min/{1.73_m2} — AB (ref >59)
GFR, EST NON AFRICAN AMERICAN: 38 mL/min/{1.73_m2} — AB (ref >59)
Glucose: 100 mg/dL — ABNORMAL HIGH (ref 65–99)
Potassium: 3.1 mmol/L — ABNORMAL LOW (ref 3.5–5.2)
Sodium: 142 mmol/L (ref 134–144)

## 2017-03-25 LAB — URIC ACID: Uric Acid: 9.2 mg/dL — ABNORMAL HIGH (ref 3.7–8.6)

## 2017-03-28 ENCOUNTER — Other Ambulatory Visit: Payer: Self-pay | Admitting: Adult Health Nurse Practitioner

## 2017-03-28 DIAGNOSIS — I10 Essential (primary) hypertension: Secondary | ICD-10-CM

## 2017-05-04 ENCOUNTER — Other Ambulatory Visit: Payer: Self-pay

## 2017-05-04 DIAGNOSIS — I10 Essential (primary) hypertension: Secondary | ICD-10-CM

## 2017-05-04 MED ORDER — CLONIDINE HCL 0.2 MG PO TABS: 0.2000 mg | ORAL_TABLET | Freq: Two times a day (BID) | ORAL | 3 refills | Status: DC

## 2017-05-19 ENCOUNTER — Ambulatory Visit: Payer: Self-pay | Admitting: Adult Health Nurse Practitioner

## 2017-05-19 DIAGNOSIS — I10 Essential (primary) hypertension: Secondary | ICD-10-CM

## 2017-05-19 MED ORDER — CLONIDINE HCL 0.3 MG PO TABS: 0.3000 mg | ORAL_TABLET | Freq: Two times a day (BID) | ORAL | 3 refills | Status: DC

## 2017-05-19 NOTE — Progress Notes (Signed)
  Patient: Steve Buck Male    DOB: 1956-05-21   61 y.o.   MRN: [id number] Visit Date: 05/19/2017  Today's Provider: Staci Acosta, NP   No chief complaint on file.  Subjective:    HPI   BP continues to be uncontrolled.  Last visit BP was 181/84.  Taking clonidine 0.2 mg BID, metoprolol 100mg  BID  and HCTZ 25mg  daily.      Allergies  Allergen Reactions  . Lisinopril    Previous Medications   ALLOPURINOL (ZYLOPRIM) 100 MG TABLET    Take 1 tablet (100 mg total) by mouth 2 (two) times daily.   CLONIDINE (CATAPRES) 0.2 MG TABLET    Take 1 tablet (0.2 mg total) by mouth 2 (two) times daily.   HYDROCHLOROTHIAZIDE (HYDRODIURIL) 25 MG TABLET    Take 1 tablet (25 mg total) by mouth daily.   METOPROLOL TARTRATE (LOPRESSOR) 100 MG TABLET    TAKE 1 TABLET BY MOUTH TWICE DAILY   NAPROXEN (NAPROSYN) 500 MG TABLET    Take 1 tablet (500 mg total) by mouth 2 (two) times daily with a meal.    Review of Systems  All other systems reviewed and are negative.   Social History  Substance Use Topics  . Smoking status: Never Smoker  . Smokeless tobacco: Never Used  . Alcohol use Yes   Objective:   BP (!) 171/99   Pulse 88   Temp 98.9 F (37.2 C)   Wt (!) 309 lb (140.2 kg)   BMI 40.77 kg/m   Physical Exam  Constitutional: He appears well-developed and well-nourished.  Cardiovascular: Normal rate, regular rhythm and normal heart sounds.   Pulmonary/Chest: Effort normal and breath sounds normal.  Vitals reviewed.       Assessment & Plan:         HTN:  Not controlled.  Goal BP <140/90.  Continue current medication regimen.  Increase clonidine to 0.3 BID Encourage low salt diet and exercise.    Staci Acosta, NP   [facility]

## 2017-06-18 ENCOUNTER — Ambulatory Visit: Payer: Self-pay | Admitting: Adult Health Nurse Practitioner

## 2017-06-18 VITALS — BP 148/88 | HR 59 | Temp 97.6°F | Wt 312.9 lb

## 2017-06-18 DIAGNOSIS — R7303 Prediabetes: Secondary | ICD-10-CM

## 2017-06-18 DIAGNOSIS — I10 Essential (primary) hypertension: Secondary | ICD-10-CM

## 2017-06-18 NOTE — Progress Notes (Signed)
  Patient: Steve Buck Male    DOB: 07-28-1956   61 y.o.   MRN: [id number] Visit Date: 06/18/2017  Today's Provider: [place]   Chief Complaint  Patient presents with  . Hypertension    Here for f/u of recent increase in BP Rx dose. He does not have symptoms   Subjective:    Steve Buck is 61 y/o with hx of HTN here for BP f/u  Adherent to medication. Concerns for gout recurrence with HCTZ use. Home cooked meals for most meals, eats fastfood/hamburgers several times a week. No exercise.  Denies HA. Denies dizziness. Denies vision changes.   Gout Episode of gout when he first started HCTZ, none since. He voices concern due to continued med use. Pt endorses eating red meat several times/week.    Hypertension        Allergies  Allergen Reactions  . Lisinopril    Previous Medications   ALLOPURINOL (ZYLOPRIM) 100 MG TABLET    Take 1 tablet (100 mg total) by mouth 2 (two) times daily.   CLONIDINE (CATAPRES) 0.3 MG TABLET    Take 1 tablet (0.3 mg total) by mouth 2 (two) times daily.   HYDROCHLOROTHIAZIDE (HYDRODIURIL) 25 MG TABLET    Take 1 tablet (25 mg total) by mouth daily.   METOPROLOL TARTRATE (LOPRESSOR) 100 MG TABLET    TAKE 1 TABLET BY MOUTH TWICE DAILY   NAPROXEN (NAPROSYN) 500 MG TABLET    Take 1 tablet (500 mg total) by mouth 2 (two) times daily with a meal.    Review of Systems  All other systems reviewed and are negative.   Social History  Substance Use Topics  . Smoking status: Never Smoker  . Smokeless tobacco: Never Used  . Alcohol use Yes   Objective:   BP (!) 148/88   Pulse (!) 59   Temp 97.6 F (36.4 C)   Wt (!) 312 lb 14.4 oz (141.9 kg)   BMI 41.28 kg/m   Physical Exam  Constitutional: He is oriented to person, place, and time. He appears well-developed and well-nourished.  HENT:  Head: Normocephalic and atraumatic.  Eyes: Conjunctivae are normal.  Cardiovascular: Normal rate, regular rhythm and normal heart sounds.    Pulmonary/Chest: Effort normal and breath sounds normal.  Musculoskeletal: He exhibits edema.  2+ pitting edema bilateral lower extremities to knees.   Neurological: He is alert and oriented to person, place, and time.  Skin: Skin is warm and dry.  Psychiatric: He has a normal mood and affect.        Assessment & Plan:        Steve Buck is 61 y/o male with PMHx significant for HTN, CKD here for f/u of BP.  BP Pt to increase exercise to 20 min walking/day. Pt to improve diet; will cut out some fast foods Pt doesn't want to increase HCTZ because of concern for gout, prefers lifestyle changes. Return in one month for BP recheck.   Gout Hx of gout with HCTZ use. Pt to decrease red meat intake. Continue to take allopurinol.  Hypokalemia Potassium 3.1 at last labs. Likely due to continued HCTZ use. Pt to take 20 meq potassium po qd that he will purchase over the counter.   Labs in 1 month.   [facility] [place]

## 2017-07-09 ENCOUNTER — Other Ambulatory Visit: Payer: Self-pay | Admitting: Adult Health Nurse Practitioner

## 2017-07-09 DIAGNOSIS — I10 Essential (primary) hypertension: Secondary | ICD-10-CM

## 2017-07-14 ENCOUNTER — Other Ambulatory Visit: Payer: Self-pay | Admitting: Adult Health Nurse Practitioner

## 2017-07-16 ENCOUNTER — Ambulatory Visit: Payer: Self-pay | Admitting: Adult Health Nurse Practitioner

## 2017-07-16 VITALS — BP 149/88 | HR 56 | Temp 97.8°F | Ht 73.0 in | Wt 309.1 lb

## 2017-07-16 DIAGNOSIS — I10 Essential (primary) hypertension: Secondary | ICD-10-CM

## 2017-07-16 MED ORDER — AMLODIPINE BESYLATE 10 MG PO TABS: 10.0000 mg | ORAL_TABLET | Freq: Every day | ORAL | 3 refills | Status: DC

## 2017-07-16 NOTE — Progress Notes (Signed)
  Patient: Steve Buck Male    DOB: Oct 24, 1955   61 y.o.   MRN: [id number] Visit Date: 07/16/2017  Today's Provider: [place]   Chief Complaint  Patient presents with  . Follow-up   Subjective:    Steve Buck is 61 y/o with hx of HTN here for BP f/u  HTN Adherent to medication. Checks BP at home: 130's/75 to 170/103 Concerns for gout recurrence with HCTZ use. Diet not changed - Home cooked meals for most meals, eats fastfood/hamburgers several times a week. Walks 2-3 times/week up to one month.  Denies HA. Denies dizziness. Denies vision changes.   Gout Episode of gout when he first started HCTZ, none since last visit. Concerned about increasing HCTZ dose because of this. Pt endorses eating red meat several times/week, as well as high salt diet.        Allergies  Allergen Reactions  . Lisinopril    Previous Medications   ALLOPURINOL (ZYLOPRIM) 100 MG TABLET    Take 1 tablet (100 mg total) by mouth 2 (two) times daily.   CLONIDINE (CATAPRES) 0.3 MG TABLET    Take 1 tablet (0.3 mg total) by mouth 2 (two) times daily.   HYDROCHLOROTHIAZIDE (HYDRODIURIL) 25 MG TABLET    TAKE 1 TABLET BY MOUTH ONCE DAILY   METOPROLOL TARTRATE (LOPRESSOR) 100 MG TABLET    TAKE 1 TABLET BY MOUTH TWICE DAILY   NAPROXEN (NAPROSYN) 500 MG TABLET    Take 1 tablet (500 mg total) by mouth 2 (two) times daily with a meal.    Review of Systems  All other systems reviewed and are negative.   Social History  Substance Use Topics  . Smoking status: Never Smoker  . Smokeless tobacco: Never Used  . Alcohol use Yes   Objective:   BP (!) 149/88 (BP Location: Left Arm)   Pulse (!) 56   Temp 97.8 F (36.6 C)   Ht 6\' 1"  (1.854 m)   Wt (!) 309 lb 1.6 oz (140.2 kg)   BMI 40.78 kg/m   Physical Exam  Constitutional: He is oriented to person, place, and time. He appears well-developed and well-nourished.  HENT:  Head: Normocephalic and atraumatic.  Cardiovascular: Normal rate,  regular rhythm and normal heart sounds.   Pulmonary/Chest: Effort normal and breath sounds normal.  Neurological: He is alert and oriented to person, place, and time.  Skin: Skin is warm and dry.  Psychiatric: He has a normal mood and affect.        Assessment & Plan:     Steve Buck is 61 y/o with hx of HTN here for BP f/u  HTN BP poorly controlled over the past month. Pt has made some lifestyle changes leading to 3 lb weight loss, but largely unwilling to make further changes. Pt unwilling to increase HCTZ dose due to concern for gout. Amlodipine added to regimen today. F/u in 1 month.   Gout Well-controlled. Counseled patient on diet/exercise.       [facility] [place]

## 2017-08-20 ENCOUNTER — Ambulatory Visit: Payer: Self-pay

## 2017-08-21 ENCOUNTER — Other Ambulatory Visit: Payer: Self-pay | Admitting: Adult Health Nurse Practitioner

## 2017-08-27 ENCOUNTER — Ambulatory Visit: Payer: Self-pay | Admitting: Adult Health Nurse Practitioner

## 2017-08-27 VITALS — BP 142/87 | HR 65 | Temp 98.7°F | Wt 321.2 lb

## 2017-08-27 DIAGNOSIS — I10 Essential (primary) hypertension: Secondary | ICD-10-CM

## 2017-08-27 MED ORDER — CLONIDINE HCL 0.3 MG PO TABS: 0.3000 mg | ORAL_TABLET | Freq: Two times a day (BID) | ORAL | 3 refills | Status: DC

## 2017-08-27 NOTE — Progress Notes (Signed)
  Patient: Steve Buck Male    DOB: 10-16-1955   61 y.o.   MRN: [id number] Visit Date: 08/27/2017  Today's Provider: Staci Acosta, NP   No chief complaint on file.  Subjective:    HPI     Pt is taking amlodipine 10mg  daily along with HCTZ and clonidine.  States that his BP went to 124/68 and he became dizzy and light headed when he first started the medication. Now he is not having any of the spells because his BP has not been that low.   Pt has neuropathy in his feet that inhibits his ability to exercise as much as he would like. He has taken gabapentin and other medications with no results.   Allergies  Allergen Reactions  . Lisinopril    This SmartLink is deprecated. Use AVSMEDLIST instead to display the medication list for a patient.  Review of Systems  All other systems reviewed and are negative.   Social History   Tobacco Use  . Smoking status: Never Smoker  . Smokeless tobacco: Never Used  Substance Use Topics  . Alcohol use: Yes   Objective:   BP (!) 142/87   Pulse 65   Temp 98.7 F (37.1 C)   Wt (!) 321 lb 3.2 oz (145.7 kg)   BMI 42.38 kg/m   Physical Exam  Constitutional: He appears well-developed and well-nourished.  HENT:  Head: Normocephalic and atraumatic.  Cardiovascular: Normal rate, regular rhythm and normal heart sounds.  Pulmonary/Chest: Effort normal and breath sounds normal.  Abdominal: Soft. Bowel sounds are normal.  Skin: Skin is warm and dry.  Vitals reviewed.       Assessment & Plan:         HTN:  Borderline.  Goal BP <140/90.  Continue current medication regimen.  Encourage low salt diet and exercise.  BP will improve with weight loss.     Staci Acosta, NP   [facility] [place]

## 2017-11-04 ENCOUNTER — Other Ambulatory Visit: Payer: Self-pay | Admitting: Adult Health Nurse Practitioner

## 2017-11-27 ENCOUNTER — Other Ambulatory Visit: Payer: Self-pay | Admitting: Adult Health Nurse Practitioner

## 2017-12-01 ENCOUNTER — Emergency Department: Payer: Self-pay

## 2017-12-01 ENCOUNTER — Inpatient Hospital Stay
Admission: EM | Admit: 2017-12-01 | Discharge: 2017-12-04 | DRG: 638 | Disposition: A | Discharge status: Home / Self Care | Payer: Self-pay | Attending: Family Medicine | Admitting: Family Medicine

## 2017-12-01 ENCOUNTER — Other Ambulatory Visit: Payer: Self-pay

## 2017-12-01 ENCOUNTER — Encounter: Payer: Self-pay | Admitting: Emergency Medicine

## 2017-12-01 DIAGNOSIS — N189 Chronic kidney disease, unspecified: Secondary | ICD-10-CM | POA: Diagnosis present

## 2017-12-01 DIAGNOSIS — N179 Acute kidney failure, unspecified: Secondary | ICD-10-CM | POA: Diagnosis present

## 2017-12-01 DIAGNOSIS — E1110 Type 2 diabetes mellitus with ketoacidosis without coma: Secondary | ICD-10-CM | POA: Diagnosis present

## 2017-12-01 DIAGNOSIS — E785 Hyperlipidemia, unspecified: Secondary | ICD-10-CM | POA: Diagnosis present

## 2017-12-01 DIAGNOSIS — E871 Hypo-osmolality and hyponatremia: Secondary | ICD-10-CM | POA: Diagnosis present

## 2017-12-01 DIAGNOSIS — E860 Dehydration: Secondary | ICD-10-CM | POA: Diagnosis present

## 2017-12-01 DIAGNOSIS — E872 Acidosis: Secondary | ICD-10-CM | POA: Diagnosis present

## 2017-12-01 DIAGNOSIS — Z7982 Long term (current) use of aspirin: Secondary | ICD-10-CM

## 2017-12-01 DIAGNOSIS — I129 Hypertensive chronic kidney disease with stage 1 through stage 4 chronic kidney disease, or unspecified chronic kidney disease: Secondary | ICD-10-CM | POA: Diagnosis present

## 2017-12-01 DIAGNOSIS — M1A9XX0 Chronic gout, unspecified, without tophus (tophi): Secondary | ICD-10-CM | POA: Diagnosis present

## 2017-12-01 DIAGNOSIS — E876 Hypokalemia: Secondary | ICD-10-CM | POA: Diagnosis present

## 2017-12-01 DIAGNOSIS — E8881 Metabolic syndrome: Secondary | ICD-10-CM | POA: Diagnosis present

## 2017-12-01 DIAGNOSIS — E1100 Type 2 diabetes mellitus with hyperosmolarity without nonketotic hyperglycemic-hyperosmolar coma (NKHHC): Principal | ICD-10-CM | POA: Diagnosis present

## 2017-12-01 DIAGNOSIS — E1122 Type 2 diabetes mellitus with diabetic chronic kidney disease: Secondary | ICD-10-CM | POA: Diagnosis present

## 2017-12-01 HISTORY — DX: Type 2 diabetes mellitus with ketoacidosis without coma: E11.10

## 2017-12-01 LAB — CBC
HCT: 50.7 % (ref 40.0–52.0)
HEMATOCRIT: 45.2 % (ref 40.0–52.0)
Hemoglobin: 17.1 g/dL (ref 13.0–18.0)
Hemoglobin: 15.1 g/dL (ref 13.0–18.0)
MCH: 31.5 pg (ref 26.0–34.0)
MCH: 31.9 pg (ref 26.0–34.0)
MCHC: 33.7 g/dL (ref 32.0–36.0)
MCHC: 33.4 g/dL (ref 32.0–36.0)
MCV: 93.3 fL (ref 80.0–100.0)
MCV: 95.3 fL (ref 80.0–100.0)
PLATELETS: 227 10*3/uL (ref 150–440)
PLATELETS: 210 10*3/uL (ref 150–440)
RBC: 5.43 MIL/uL (ref 4.40–5.90)
RBC: 4.74 MIL/uL (ref 4.40–5.90)
RDW: 12.9 % (ref 11.5–14.5)
RDW: 13.2 % (ref 11.5–14.5)
WBC: 7.7 10*3/uL (ref 3.8–10.6)
WBC: 8.7 10*3/uL (ref 3.8–10.6)

## 2017-12-01 LAB — BASIC METABOLIC PANEL
ANION GAP: 20 — AB (ref 5–15)
ANION GAP: 15 (ref 5–15)
ANION GAP: 14 (ref 5–15)
Anion gap: 13 (ref 5–15)
BUN: 49 mg/dL — ABNORMAL HIGH (ref 6–20)
BUN: 50 mg/dL — AB (ref 6–20)
BUN: 50 mg/dL — ABNORMAL HIGH (ref 6–20)
BUN: 52 mg/dL — AB (ref 6–20)
CALCIUM: 8.8 mg/dL — AB (ref 8.9–10.3)
CALCIUM: 9.3 mg/dL (ref 8.9–10.3)
CALCIUM: 9.4 mg/dL (ref 8.9–10.3)
CHLORIDE: 81 mmol/L — AB (ref 101–111)
CO2: 26 mmol/L (ref 22–32)
CO2: 32 mmol/L (ref 22–32)
CO2: 31 mmol/L (ref 22–32)
CO2: 33 mmol/L — ABNORMAL HIGH (ref 22–32)
CREATININE: 2.31 mg/dL — AB (ref 0.61–1.24)
Calcium: 9.7 mg/dL (ref 8.9–10.3)
Chloride: 81 mmol/L — ABNORMAL LOW (ref 101–111)
Chloride: 86 mmol/L — ABNORMAL LOW (ref 101–111)
Chloride: 91 mmol/L — ABNORMAL LOW (ref 101–111)
Creatinine, Ser: 2.4 mg/dL — ABNORMAL HIGH (ref 0.61–1.24)
Creatinine, Ser: 2.26 mg/dL — ABNORMAL HIGH (ref 0.61–1.24)
Creatinine, Ser: 2.05 mg/dL — ABNORMAL HIGH (ref 0.61–1.24)
GFR calc Af Amer: 31 mL/min — ABNORMAL LOW (ref >60)
GFR calc Af Amer: 34 mL/min — ABNORMAL LOW (ref >60)
GFR calc Af Amer: 38 mL/min — ABNORMAL LOW (ref >60)
GFR calc non Af Amer: 28 mL/min — ABNORMAL LOW (ref >60)
GFR, EST AFRICAN AMERICAN: 33 mL/min — AB (ref >60)
GFR, EST NON AFRICAN AMERICAN: 27 mL/min — AB (ref >60)
GFR, EST NON AFRICAN AMERICAN: 29 mL/min — AB (ref >60)
GFR, EST NON AFRICAN AMERICAN: 33 mL/min — AB (ref >60)
Glucose, Bld: 763 mg/dL (ref 65–99)
Glucose, Bld: 833 mg/dL (ref 65–99)
Glucose, Bld: 553 mg/dL (ref 65–99)
Glucose, Bld: 211 mg/dL — ABNORMAL HIGH (ref 65–99)
POTASSIUM: 2.9 mmol/L — AB (ref 3.5–5.1)
POTASSIUM: 3.2 mmol/L — AB (ref 3.5–5.1)
POTASSIUM: 2.8 mmol/L — AB (ref 3.5–5.1)
Potassium: 3.4 mmol/L — ABNORMAL LOW (ref 3.5–5.1)
SODIUM: 127 mmol/L — AB (ref 135–145)
SODIUM: 128 mmol/L — AB (ref 135–145)
SODIUM: 131 mmol/L — AB (ref 135–145)
SODIUM: 137 mmol/L (ref 135–145)

## 2017-12-01 LAB — URINALYSIS, COMPLETE (UACMP) WITH MICROSCOPIC
BILIRUBIN URINE: NEGATIVE
Bacteria, UA: NONE SEEN
Ketones, ur: NEGATIVE mg/dL
LEUKOCYTES UA: NEGATIVE
Nitrite: NEGATIVE
PH: 6 (ref 5.0–8.0)
Protein, ur: 30 mg/dL — AB
Specific Gravity, Urine: 1.02 (ref 1.005–1.030)
Squamous Epithelial / LPF: NONE SEEN

## 2017-12-01 LAB — BLOOD GAS, VENOUS
Acid-Base Excess: 5.4 mmol/L — ABNORMAL HIGH (ref 0.0–2.0)
BICARBONATE: 32.1 mmol/L — AB (ref 20.0–28.0)
O2 SAT: 79.2 %
PATIENT TEMPERATURE: 37
PCO2 VEN: 53 mmHg (ref 44.0–60.0)
PO2 VEN: 44 mmHg (ref 32.0–45.0)
pH, Ven: 7.39 (ref 7.250–7.430)

## 2017-12-01 LAB — GLUCOSE, CAPILLARY
GLUCOSE-CAPILLARY: 560 mg/dL — AB (ref 65–99)
GLUCOSE-CAPILLARY: 324 mg/dL — AB (ref 65–99)
GLUCOSE-CAPILLARY: 212 mg/dL — AB (ref 65–99)
Glucose-Capillary: >600 mg/dL (ref 65–99)
Glucose-Capillary: >600 mg/dL (ref 65–99)
Glucose-Capillary: 430 mg/dL — ABNORMAL HIGH (ref 65–99)
Glucose-Capillary: 241 mg/dL — ABNORMAL HIGH (ref 65–99)
Glucose-Capillary: 192 mg/dL — ABNORMAL HIGH (ref 65–99)
Glucose-Capillary: 187 mg/dL — ABNORMAL HIGH (ref 65–99)

## 2017-12-01 LAB — MRSA PCR SCREENING: MRSA by PCR: NEGATIVE

## 2017-12-01 MED ORDER — SODIUM CHLORIDE 0.9 % IV BOLUS (SEPSIS)
1000.0000 mL | Freq: Once | INTRAVENOUS | Status: AC
  Administered 2017-12-01: 1000 mL via INTRAVENOUS

## 2017-12-01 MED ORDER — CLONIDINE HCL 0.1 MG PO TABS
0.3000 mg | ORAL_TABLET | Freq: Two times a day (BID) | ORAL | Status: DC
  Administered 2017-12-01 – 2017-12-02 (×2): 0.3 mg via ORAL
  Filled 2017-12-01 (×2): qty 3

## 2017-12-01 MED ORDER — SODIUM CHLORIDE 0.9 % IV SOLN
INTRAVENOUS | Status: DC
  Administered 2017-12-01: 5.4 [IU]/h via INTRAVENOUS
  Filled 2017-12-01: qty 1

## 2017-12-01 MED ORDER — AMLODIPINE BESYLATE 10 MG PO TABS
10.0000 mg | ORAL_TABLET | Freq: Every day | ORAL | Status: DC
  Administered 2017-12-02 – 2017-12-04 (×3): 10 mg via ORAL
  Filled 2017-12-01 (×2): qty 2
  Filled 2017-12-01: qty 1

## 2017-12-01 MED ORDER — SODIUM CHLORIDE 0.9 % IV SOLN
INTRAVENOUS | Status: DC
  Administered 2017-12-01: 13:00:00 via INTRAVENOUS

## 2017-12-01 MED ORDER — ENOXAPARIN SODIUM 40 MG/0.4ML ~~LOC~~ SOLN
40.0000 mg | SUBCUTANEOUS | Status: DC
  Administered 2017-12-01 – 2017-12-03 (×3): 40 mg via SUBCUTANEOUS
  Filled 2017-12-01 (×3): qty 0.4

## 2017-12-01 MED ORDER — POTASSIUM CHLORIDE 10 MEQ/100ML IV SOLN
10.0000 meq | INTRAVENOUS | Status: AC
  Administered 2017-12-01 (×2): 10 meq via INTRAVENOUS
  Filled 2017-12-01 (×2): qty 100

## 2017-12-01 MED ORDER — SODIUM CHLORIDE 0.9 % IV SOLN: INTRAVENOUS | Status: DC

## 2017-12-01 MED ORDER — POTASSIUM CHLORIDE CRYS ER 20 MEQ PO TBCR
80.0000 meq | EXTENDED_RELEASE_TABLET | Freq: Once | ORAL | Status: AC
  Administered 2017-12-01: 80 meq via ORAL
  Filled 2017-12-01: qty 4

## 2017-12-01 MED ORDER — ASPIRIN EC 81 MG PO TBEC
81.0000 mg | DELAYED_RELEASE_TABLET | Freq: Every day | ORAL | Status: DC
Start: 2017-12-02 — End: 2017-12-04
  Administered 2017-12-02 – 2017-12-04 (×3): 81 mg via ORAL
  Filled 2017-12-01 (×3): qty 1

## 2017-12-01 MED ORDER — ALLOPURINOL 100 MG PO TABS
100.0000 mg | ORAL_TABLET | Freq: Two times a day (BID) | ORAL | Status: DC
  Administered 2017-12-01 – 2017-12-04 (×6): 100 mg via ORAL
  Filled 2017-12-01 (×8): qty 1

## 2017-12-01 MED ORDER — LIVING WELL WITH DIABETES BOOK
Freq: Once | Status: AC
  Administered 2017-12-01: 17:00:00
  Filled 2017-12-01: qty 1

## 2017-12-01 MED ORDER — METOPROLOL TARTRATE 50 MG PO TABS
100.0000 mg | ORAL_TABLET | Freq: Two times a day (BID) | ORAL | Status: DC
  Administered 2017-12-02: 100 mg via ORAL
  Filled 2017-12-01: qty 2

## 2017-12-01 MED ORDER — DEXTROSE-NACL 5-0.45 % IV SOLN
INTRAVENOUS | Status: DC
  Administered 2017-12-01: 20:00:00 via INTRAVENOUS

## 2017-12-01 MED ORDER — SODIUM CHLORIDE 0.9 % IV SOLN
INTRAVENOUS | Status: DC
  Administered 2017-12-01: 18.5 [IU]/h via INTRAVENOUS
  Administered 2017-12-02: 6.4 [IU]/h via INTRAVENOUS
  Administered 2017-12-02: 5.7 [IU]/h via INTRAVENOUS
  Administered 2017-12-02: 17.1 [IU]/h via INTRAVENOUS
  Administered 2017-12-02: 2.1 [IU]/h via INTRAVENOUS
  Administered 2017-12-02: 5.8 [IU]/h via INTRAVENOUS
  Filled 2017-12-01 (×4): qty 1

## 2017-12-01 MED ORDER — DEXTROSE-NACL 5-0.45 % IV SOLN
INTRAVENOUS | Status: DC
  Administered 2017-12-02: 03:00:00 via INTRAVENOUS

## 2017-12-01 NOTE — Progress Notes (Signed)
Inpatient Diabetes Program Recommendations  AACE/ADA: New Consensus Statement on Inpatient Glycemic Control (2015)  Target Ranges:  Prepandial:   less than 140 mg/dL      Peak postprandial:   less than 180 mg/dL (1-2 hours)      Critically ill patients:  140 - 180 mg/dL   Lab Results  Component Value Date   GLUCAP >600 ([place]) 12/01/2017   HGBA1C 6.5 ([facility]) 01/20/2017    Review of Glycemic Control: Note patient admitted with CBG's>800 mg/dL  Diabetes history: Pre-diabetes Outpatient Diabetes medications: None Current orders for Inpatient glycemic control:  IV insulin/DKA order set  Inpatient Diabetes Program Recommendations:    Note admit with glucose>800 mg/dL.  Patient is currently on DKA order set and IV insulin.  Please consider adding A1C to current labs as well. Text page sent.  Will see patient on 12/02/17.   Thanks,  Adah Perl, RN, BC-ADM Inpatient Diabetes Coordinator Pager [phone] (8a-5p)

## 2017-12-01 NOTE — ED Triage Notes (Signed)
Pt reports that his heart is racing it has been for the last 2 days, he feels that he is going to pass out. He also reports that he is thirsty all the time and has been urinating a lot more than usual.

## 2017-12-01 NOTE — H&P (Signed)
[facility] [place] NAME: Steve Buck    MR#:  [id number]  DATE OF BIRTH:  December 17, 1955  DATE OF ADMISSION:  12/01/2017  PRIMARY CARE PHYSICIAN: [facility]   REQUESTING/REFERRING PHYSICIAN: Rudene Re, MD  CHIEF COMPLAINT:   Chief Complaint  Patient presents with  . Palpitations  . Near Syncope    HISTORY OF PRESENT ILLNESS: Steve Buck  is a 62 y.o. male with a known history of essential hypertension, questionable CHF and morbid obesity who is presenting to the ER complaining of palpitations and near syncope.  Patient states that he has not been feeling well for the past few months.  He has had generalized weakness and fatigue and S with ambulation.  He states that he has been feeling very thirsty and has been urinating a lot.  In the emergency room he was noted to have a very high blood sugar with anion gap that is elevated.  PAST MEDICAL HISTORY:   Past Medical History:  Diagnosis Date  . Allergy    Seasonal  . CHF (congestive heart failure) ([place])   . DKA (diabetic ketoacidoses) ([place]) 12/01/2017  . Heart murmur   . Hypertension     PAST SURGICAL HISTORY:  Past Surgical History:  Procedure Laterality Date  . FOOT SURGERY  around 1994    SOCIAL HISTORY:  Social History   Tobacco Use  . Smoking status: Never Smoker  . Smokeless tobacco: Never Used  Substance Use Topics  . Alcohol use: Yes    FAMILY HISTORY:  Family History  Problem Relation Age of Onset  . Cancer Mother        Skin  . CAD Father   . Arthritis Father     DRUG ALLERGIES:  Allergies  Allergen Reactions  . Lisinopril     REVIEW OF SYSTEMS:   CONSTITUTIONAL: No fever, positive fatigue positive weakness.  EYES: No blurred or double vision.  EARS, NOSE, AND THROAT: No tinnitus or ear pain.  RESPIRATORY: No cough, shortness of breath, wheezing or hemoptysis.  CARDIOVASCULAR: No chest pain, orthopnea, edema.   GASTROINTESTINAL: No nausea, vomiting, diarrhea or abdominal pain.  GENITOURINARY: No dysuria, hematuria.  ENDOCRINE: No polyuria, nocturia,  HEMATOLOGY: No anemia, easy bruising or bleeding SKIN: No rash or lesion. MUSCULOSKELETAL: No joint pain or arthritis.   NEUROLOGIC: No tingling, numbness, weakness.  PSYCHIATRY: No anxiety or depression.   MEDICATIONS AT HOME:  Prior to Admission medications   Medication Sig Start Date End Date Taking? Authorizing Provider  allopurinol (ZYLOPRIM) 100 MG tablet TAKE 1 TABLET BY MOUTH TWICE DAILY 08/21/17  Yes Doles-Johnson, Teah, NP  amLODipine (NORVASC) 10 MG tablet TAKE 1 TABLET BY MOUTH ONCE DAILY 11/04/17  Yes Doles-Johnson, Teah, NP  aspirin EC 81 MG tablet Take 81 mg by mouth daily.   Yes [provider]  cloNIDine (CATAPRES) 0.3 MG tablet Take 1 tablet (0.3 mg total) by mouth 2 (two) times daily. 08/27/17  Yes Doles-Johnson, Teah, NP  hydrochlorothiazide (HYDRODIURIL) 25 MG tablet TAKE 1 TABLET BY MOUTH ONCE DAILY 11/30/17  Yes Doles-Johnson, Teah, NP  metoprolol tartrate (LOPRESSOR) 100 MG tablet TAKE 1 TABLET BY MOUTH TWICE DAILY 07/10/17  Yes Doles-Johnson, Teah, NP  naproxen (NAPROSYN) 500 MG tablet Take 1 tablet (500 mg total) by mouth 2 (two) times daily with a meal. Patient not taking: Reported on 05/19/2017 03/17/17   Doles-Johnson, Teah, NP      PHYSICAL EXAMINATION:   VITAL SIGNS: Blood  pressure 129/66, pulse 82, temperature 98.2 F (36.8 C), temperature source Oral, resp. rate 20, height 6\' 1"  (1.854 m), weight 300 lb (136.1 kg), SpO2 95 %.  GENERAL:  62 y.o.-year-old patient lying in the bed with no acute distress.  EYES: Pupils equal, round, reactive to light and accommodation. No scleral icterus. Extraocular muscles intact.  HEENT: Head atraumatic, normocephalic. Oropharynx and nasopharynx clear.  NECK:  Supple, no jugular venous distention. No thyroid enlargement, no tenderness.  LUNGS: Normal breath sounds bilaterally,  no wheezing, rales,rhonchi or crepitation. No use of accessory muscles of respiration.  CARDIOVASCULAR: S1, S2 normal. No murmurs, rubs, or gallops.  ABDOMEN: Soft, nontender, nondistended. Bowel sounds present. No organomegaly or mass.  EXTREMITIES: No pedal edema, cyanosis, or clubbing.  NEUROLOGIC: Cranial nerves II through XII are intact. Muscle strength 5/5 in all extremities. Sensation intact. Gait not checked.  PSYCHIATRIC: The patient is alert and oriented x 3.  SKIN: No obvious rash, lesion, or ulcer.   LABORATORY PANEL:   CBC Recent Labs  Lab 12/01/17 1110  WBC 7.7  HGB 17.1  HCT 50.7  PLT 227  MCV 93.3  MCH 31.5  MCHC 33.7  RDW 12.9   ------------------------------------------------------------------------------------------------------------------  Chemistries  Recent Labs  Lab 12/01/17 1110  NA 127*  K 3.4*  CL 81*  CO2 26  GLUCOSE 763*  BUN 49*  CREATININE 2.31*  CALCIUM 9.7   ------------------------------------------------------------------------------------------------------------------ estimated creatinine clearance is 47.4 mL/min (A) (by C-G formula based on SCr of 2.31 mg/dL (H)). ------------------------------------------------------------------------------------------------------------------ No results for input(s): TSH, T4TOTAL, T3FREE, THYROIDAB in the last 72 hours.  Invalid input(s): FREET3   Coagulation profile No results for input(s): INR, PROTIME in the last 168 hours. ------------------------------------------------------------------------------------------------------------------- No results for input(s): DDIMER in the last 72 hours. -------------------------------------------------------------------------------------------------------------------  Cardiac Enzymes No results for input(s): CKMB, TROPONINI, MYOGLOBIN in the last 168 hours.  Invalid input(s):  CK ------------------------------------------------------------------------------------------------------------------ Invalid input(s): POCBNP  ---------------------------------------------------------------------------------------------------------------  Urinalysis No results found for: COLORURINE, APPEARANCEUR, LABSPEC, PHURINE, GLUCOSEU, HGBUR, BILIRUBINUR, KETONESUR, PROTEINUR, UROBILINOGEN, NITRITE, LEUKOCYTESUR   RADIOLOGY: Dg Chest 2 View  Result Date: 12/01/2017 CLINICAL DATA:  Tachycardia.  Hypoxia.  Presyncope. EXAM: CHEST - 2 VIEW COMPARISON:  None. FINDINGS: Heart is mildly enlarged. Mild pulmonary vascular congestion is present without frank edema. No effusions are present. No focal airspace consolidation is present. Exaggerated thoracic kyphosis and mild endplate degenerative changes are noted. The visualized soft tissues and bony thorax are otherwise unremarkable. IMPRESSION: 1. Mild cardiomegaly and pulmonary vascular congestion without frank edema. This raises concern for early congestive heart failure. Electronically Signed   By: San Morelle M.D.   On: 12/01/2017 12:36    EKG: Orders placed or performed during the hospital encounter of 12/01/17  . ED EKG  . ED EKG  . EKG 12-Lead  . EKG 12-Lead  . EKG 12-Lead  . EKG 12-Lead  . ED EKG  . ED EKG  . EKG 12-Lead  . EKG 12-Lead    IMPRESSION AND PLAN: Patient 62 year old white male presenting with dizziness near syncope  1.  DKA We will give him aggressive IV fluids Insulin drip Follow electrolytes every 4 hours. Replace potassium according Check a hemoglobin A1c  2.  Acute kidney injury, also has chronic kidney disease Likely due to dehydration from hyperglycemia IV fluids monitor renal function  3.  Hypokalemia replace potassium  4.  Hypo natremia due to combination of falls [place] lower sodium due to hyperglycemia as well as dehydration give IV fluids monitor sodium  5.  Essential hypertension  continue Norvasc and Catapres and metoprolol discontinue HCTZ  6.  Gout continue allopurinol  7.  Miscellaneous heparin for DVT prophylaxis    All the records are reviewed and case discussed with ED provider. Management plans discussed with the patient, family and they are in agreement.  CODE STATUS: Code Status History    This patient does not have a recorded code status. Please follow your organizational policy for patients in this situation.       TOTAL TIME TAKING CARE OF THIS PATIENT: 55 minutes of critical care time spent  Dustin Flock M.D on 12/01/2017 at 12:49 PM  Between 7am to 6pm - Pager - [phone]  After 6pm go to [email or web address] - Proofreader  [employer] [facility] [facility]  [phone]  CC: Primary care physician; [employer], [employer]

## 2017-12-01 NOTE — ED Provider Notes (Signed)
[facility] Emergency Department Provider Note  ____________________________________________  Time seen: Approximately 12:22 PM  I have reviewed the triage vital signs and the nursing notes.   HISTORY  Chief Complaint Palpitations and Near Syncope   HPI TERRE ZABRISKIE is a 62 y.o. male with a history of hypertension, prediabetes, hyperlipidemia, obesity who presents for evaluation of dizziness and palpitations. Patient reports 3 days of polyuria, polydipsia, generalized fatigue/ weakness. his symptoms have been constant and severe. He is also complaining of moderate dyspnea on exertion. No nausea or vomiting, no URI symptoms, no chest pain, no abdominal pain, no diarrhea, no fever. Patient reports that he feels dizzy like he is going to pass out especially when he stands up. Has been feeling his heart racing on and off for the last few days.  Past Medical History:  Diagnosis Date  . Allergy    Seasonal  . CHF (congestive heart failure) ([place])   . DKA (diabetic ketoacidoses) ([place]) 12/01/2017  . Heart murmur   . Hypertension     Patient Active Problem List   Diagnosis Date Noted  . DKA (diabetic ketoacidoses) ([place]) 12/01/2017  . Healthcare maintenance 01/20/2017  . Pre-diabetes 01/08/2016  . Essential hypertension 01/08/2016  . Hyperlipemia 01/08/2016  . Nocturia 01/08/2016  . Hypertension 03/03/2013  . Rectal bleeding 03/03/2013  . Leg cramps 03/03/2013  . Gout 11/25/2012    Past Surgical History:  Procedure Laterality Date  . FOOT SURGERY  around 1994    Prior to Admission medications   Medication Sig Start Date End Date Taking? Authorizing Provider  allopurinol (ZYLOPRIM) 100 MG tablet TAKE 1 TABLET BY MOUTH TWICE DAILY 08/21/17  Yes Doles-Johnson, Teah, NP  amLODipine (NORVASC) 10 MG tablet TAKE 1 TABLET BY MOUTH ONCE DAILY 11/04/17  Yes Doles-Johnson, Teah, NP  aspirin EC 81 MG tablet Take 81 mg by mouth daily.   Yes [provider]  cloNIDine (CATAPRES) 0.3 MG tablet Take 1 tablet (0.3 mg total) by mouth 2 (two) times daily. 08/27/17  Yes Doles-Johnson, Teah, NP  hydrochlorothiazide (HYDRODIURIL) 25 MG tablet TAKE 1 TABLET BY MOUTH ONCE DAILY 11/30/17  Yes Doles-Johnson, Teah, NP  metoprolol tartrate (LOPRESSOR) 100 MG tablet TAKE 1 TABLET BY MOUTH TWICE DAILY 07/10/17  Yes Doles-Johnson, Teah, NP  naproxen (NAPROSYN) 500 MG tablet Take 1 tablet (500 mg total) by mouth 2 (two) times daily with a meal. Patient not taking: Reported on 05/19/2017 03/17/17   Doles-Johnson, Teah, NP    Allergies Lisinopril  Family History  Problem Relation Age of Onset  . Cancer Mother        Skin  . CAD Father   . Arthritis Father     Social History Social History   Tobacco Use  . Smoking status: Never Smoker  . Smokeless tobacco: Never Used  Substance Use Topics  . Alcohol use: Yes  . Drug use: Yes    Types: Marijuana    Review of Systems  Constitutional: Negative for fever. + dizziness Eyes: Negative for visual changes. ENT: Negative for sore throat. Neck: No neck pain  Cardiovascular: Negative for chest pain. + palpitations Respiratory: Negative for shortness of breath. + DOE Gastrointestinal: Negative for abdominal pain, vomiting or diarrhea. Endo: + polyuria and polydipsia Genitourinary: Negative for dysuria. Musculoskeletal: Negative for back pain. Skin: Negative for rash. Neurological: Negative for headaches, weakness or numbness. Psych: No SI or HI  ____________________________________________   PHYSICAL EXAM:  VITAL SIGNS: ED Triage Vitals  Enc Vitals  Group     BP 12/01/17 1100 129/66     Pulse Rate 12/01/17 1100 82     Resp 12/01/17 1100 20     Temp 12/01/17 1100 98.2 F (36.8 C)     Temp Source 12/01/17 1100 Oral     SpO2 12/01/17 1100 95 %     Weight 12/01/17 1057 300 lb (136.1 kg)     Height 12/01/17 1057 6\' 1"  (1.854 m)     Head Circumference --      Peak Flow --      Pain  Score 12/01/17 1056 0     Pain Loc --      Pain Edu? --      Excl. in [place]? --     Constitutional: Alert and oriented. Well appearing and in no apparent distress. HEENT:      Head: Normocephalic and atraumatic.         Eyes: Conjunctivae are normal. Sclera is non-icteric.       Mouth/Throat: Mucous membranes are dry.       Neck: Supple with no signs of meningismus. Cardiovascular: Regular rate and rhythm. No murmurs, gallops, or rubs. 2+ symmetrical distal pulses are present in all extremities. No JVD. Respiratory: Normal respiratory effort. Lungs are clear to auscultation bilaterally. No wheezes, crackles, or rhonchi.  Gastrointestinal: Soft, non tender, and non distended with positive bowel sounds. No rebound or guarding. Musculoskeletal: Nontender with normal range of motion in all extremities. No edema, cyanosis, or erythema of extremities. Neurologic: Normal speech and language. Face is symmetric. Moving all extremities. No gross focal neurologic deficits are appreciated. Skin: Skin is warm, dry and intact. No rash noted. Psychiatric: Mood and affect are normal. Speech and behavior are normal.  ____________________________________________   LABS (all labs ordered are listed, but only abnormal results are displayed)  Labs Reviewed  BASIC METABOLIC PANEL - Abnormal; Notable for the following components:      Result Value   Sodium 127 (*)    Potassium 3.4 (*)    Chloride 81 (*)    Glucose, Bld 763 (*)    BUN 49 (*)    Creatinine, Ser 2.31 (*)    GFR calc non Af Amer 28 (*)    GFR calc Af Amer 33 (*)    Anion gap 20 (*)    All other components within normal limits  URINALYSIS, COMPLETE (UACMP) WITH MICROSCOPIC - Abnormal; Notable for the following components:   Color, Urine YELLOW (*)    APPearance CLEAR (*)    Glucose, UA >=500 (*)    Hgb urine dipstick SMALL (*)    Protein, ur 30 (*)    All other components within normal limits  BLOOD GAS, VENOUS - Abnormal; Notable for  the following components:   Bicarbonate 32.1 (*)    Acid-Base Excess 5.4 (*)    All other components within normal limits  BASIC METABOLIC PANEL - Abnormal; Notable for the following components:   Sodium 128 (*)    Potassium 2.9 (*)    Chloride 81 (*)    Glucose, Bld 833 (*)    BUN 50 (*)    Creatinine, Ser 2.40 (*)    Calcium 8.8 (*)    GFR calc non Af Amer 27 (*)    GFR calc Af Amer 31 (*)    All other components within normal limits  GLUCOSE, CAPILLARY - Abnormal; Notable for the following components:   Glucose-Capillary >600 (*)    All other components  within normal limits  GLUCOSE, CAPILLARY - Abnormal; Notable for the following components:   Glucose-Capillary >600 (*)    All other components within normal limits  GLUCOSE, CAPILLARY - Abnormal; Notable for the following components:   Glucose-Capillary >600 (*)    All other components within normal limits  GLUCOSE, CAPILLARY - Abnormal; Notable for the following components:   Glucose-Capillary >600 (*)    All other components within normal limits  MRSA PCR SCREENING  CBC  CBC  BASIC METABOLIC PANEL  BASIC METABOLIC PANEL  BASIC METABOLIC PANEL  CBG MONITORING, ED   ____________________________________________  EKG  ED ECG REPORT I, Rudene Re, the attending physician, personally viewed and interpreted this ECG.  sinus bradycardia, rate of 52, first-degree AV block, normal QRS and QTc intervals, T-wave inversions in lateral leads, no ST elevation.no prior for comparison ____________________________________________  RADIOLOGY  I have personally reviewed the images performed during this visit and I agree with the Radiologist's read.   Interpretation by Radiologist:  Dg Chest 2 View  Result Date: 12/01/2017 CLINICAL DATA:  Tachycardia.  Hypoxia.  Presyncope. EXAM: CHEST - 2 VIEW COMPARISON:  None. FINDINGS: Heart is mildly enlarged. Mild pulmonary vascular congestion is present without frank edema. No  effusions are present. No focal airspace consolidation is present. Exaggerated thoracic kyphosis and mild endplate degenerative changes are noted. The visualized soft tissues and bony thorax are otherwise unremarkable. IMPRESSION: 1. Mild cardiomegaly and pulmonary vascular congestion without frank edema. This raises concern for early congestive heart failure. Electronically Signed   By: San Morelle M.D.   On: 12/01/2017 12:36      ____________________________________________   PROCEDURES  Procedure(s) performed: None Procedures Critical Care performed: yes  CRITICAL CARE Performed by: Rudene Re  ?  Total critical care time: 40 min  Critical care time was exclusive of separately billable procedures and treating other patients.  Critical care was necessary to treat or prevent imminent or life-threatening deterioration.  Critical care was time spent personally by me on the following activities: development of treatment plan with patient and/or surrogate as well as nursing, discussions with consultants, evaluation of patient's response to treatment, examination of patient, obtaining history from patient or surrogate, ordering and performing treatments and interventions, ordering and review of laboratory studies, ordering and review of radiographic studies, pulse oximetry and re-evaluation of patient's condition.  ____________________________________________   INITIAL IMPRESSION / ASSESSMENT AND PLAN / ED COURSE  62 y.o. male with a history of hypertension, prediabetes, hyperlipidemia, obesity who presents for evaluation of dizziness, polyuria, polydipsia, and palpitations for 3 days. Patient looks dry on exam, normal vital signs, labs consistent with DKA and acute on chronic kidney injury. Patient started on IVF and insulin drip. Will be admitted to the Hospitalist service.       As part of my medical decision making, I reviewed the following data within the  [place] notes reviewed and incorporated, Labs reviewed , EKG interpreted , Notes from prior ED visits and [place] Controlled Substance Database    Pertinent labs & imaging results that were available during my care of the patient were reviewed by me and considered in my medical decision making (see chart for details).    ____________________________________________   FINAL CLINICAL IMPRESSION(S) / ED DIAGNOSES  Final diagnoses:  Diabetic ketoacidosis without coma associated with type 2 diabetes mellitus ([place])  Acute kidney injury superimposed on chronic kidney disease ([place])      NEW MEDICATIONS STARTED DURING THIS VISIT:  ED Discharge Orders    None       Note:  This document was prepared using Dragon voice recognition software and may include unintentional dictation errors.    Alfred Levins, [place], MD 12/01/17 [phone]

## 2017-12-01 NOTE — Progress Notes (Addendum)
Admitted from ED with blood sugar greater than 800. Alert and oriented. Blood sugar down to 560. Educated briefly about diabetic teaching book then family arrived. Patient states he already feels better. Oxygen on at 2 liters nasal cannula. Remains in Sinus Bradycardia.

## 2017-12-01 NOTE — ED Notes (Signed)
Pt placed on 2L O2 via n/c for RA O2 sat 89%. EDP [place] notified.

## 2017-12-02 LAB — MAGNESIUM
MAGNESIUM: 2.2 mg/dL (ref 1.7–2.4)
MAGNESIUM: 2.3 mg/dL (ref 1.7–2.4)

## 2017-12-02 LAB — PHOSPHORUS
PHOSPHORUS: 2 mg/dL — AB (ref 2.5–4.6)
Phosphorus: 2.3 mg/dL — ABNORMAL LOW (ref 2.5–4.6)

## 2017-12-02 LAB — BASIC METABOLIC PANEL
ANION GAP: 13 (ref 5–15)
Anion gap: 8 (ref 5–15)
Anion gap: 8 (ref 5–15)
Anion gap: 10 (ref 5–15)
BUN: 50 mg/dL — ABNORMAL HIGH (ref 6–20)
BUN: 47 mg/dL — ABNORMAL HIGH (ref 6–20)
BUN: 43 mg/dL — ABNORMAL HIGH (ref 6–20)
BUN: 44 mg/dL — AB (ref 6–20)
CALCIUM: 8.7 mg/dL — AB (ref 8.9–10.3)
CALCIUM: 8.6 mg/dL — AB (ref 8.9–10.3)
CALCIUM: 8.5 mg/dL — AB (ref 8.9–10.3)
CHLORIDE: 97 mmol/L — AB (ref 101–111)
CO2: 30 mmol/L (ref 22–32)
CO2: 31 mmol/L (ref 22–32)
CO2: 28 mmol/L (ref 22–32)
CO2: 34 mmol/L — AB (ref 22–32)
CREATININE: 1.91 mg/dL — AB (ref 0.61–1.24)
CREATININE: 1.78 mg/dL — AB (ref 0.61–1.24)
Calcium: 8.3 mg/dL — ABNORMAL LOW (ref 8.9–10.3)
Chloride: 93 mmol/L — ABNORMAL LOW (ref 101–111)
Chloride: 97 mmol/L — ABNORMAL LOW (ref 101–111)
Chloride: 96 mmol/L — ABNORMAL LOW (ref 101–111)
Creatinine, Ser: 2.28 mg/dL — ABNORMAL HIGH (ref 0.61–1.24)
Creatinine, Ser: 1.97 mg/dL — ABNORMAL HIGH (ref 0.61–1.24)
GFR calc Af Amer: 33 mL/min — ABNORMAL LOW (ref >60)
GFR calc Af Amer: 40 mL/min — ABNORMAL LOW (ref >60)
GFR calc Af Amer: 41 mL/min — ABNORMAL LOW (ref >60)
GFR calc Af Amer: 45 mL/min — ABNORMAL LOW (ref >60)
GFR calc non Af Amer: 29 mL/min — ABNORMAL LOW (ref >60)
GFR calc non Af Amer: 34 mL/min — ABNORMAL LOW (ref >60)
GFR calc non Af Amer: 36 mL/min — ABNORMAL LOW (ref >60)
GFR calc non Af Amer: 39 mL/min — ABNORMAL LOW (ref >60)
GLUCOSE: 264 mg/dL — AB (ref 65–99)
GLUCOSE: 151 mg/dL — AB (ref 65–99)
Glucose, Bld: 275 mg/dL — ABNORMAL HIGH (ref 65–99)
Glucose, Bld: 302 mg/dL — ABNORMAL HIGH (ref 65–99)
Potassium: 2.8 mmol/L — ABNORMAL LOW (ref 3.5–5.1)
Potassium: 3 mmol/L — ABNORMAL LOW (ref 3.5–5.1)
Potassium: 3.5 mmol/L (ref 3.5–5.1)
Potassium: 3.2 mmol/L — ABNORMAL LOW (ref 3.5–5.1)
SODIUM: 135 mmol/L (ref 135–145)
Sodium: 137 mmol/L (ref 135–145)
Sodium: 139 mmol/L (ref 135–145)
Sodium: 134 mmol/L — ABNORMAL LOW (ref 135–145)

## 2017-12-02 LAB — GLUCOSE, CAPILLARY
GLUCOSE-CAPILLARY: 174 mg/dL — AB (ref 65–99)
GLUCOSE-CAPILLARY: 151 mg/dL — AB (ref 65–99)
GLUCOSE-CAPILLARY: 229 mg/dL — AB (ref 65–99)
GLUCOSE-CAPILLARY: 271 mg/dL — AB (ref 65–99)
GLUCOSE-CAPILLARY: 337 mg/dL — AB (ref 65–99)
GLUCOSE-CAPILLARY: 355 mg/dL — AB (ref 65–99)
GLUCOSE-CAPILLARY: 266 mg/dL — AB (ref 65–99)
GLUCOSE-CAPILLARY: 128 mg/dL — AB (ref 65–99)
Glucose-Capillary: 143 mg/dL — ABNORMAL HIGH (ref 65–99)
Glucose-Capillary: 144 mg/dL — ABNORMAL HIGH (ref 65–99)
Glucose-Capillary: 152 mg/dL — ABNORMAL HIGH (ref 65–99)
Glucose-Capillary: 153 mg/dL — ABNORMAL HIGH (ref 65–99)
Glucose-Capillary: 158 mg/dL — ABNORMAL HIGH (ref 65–99)
Glucose-Capillary: 179 mg/dL — ABNORMAL HIGH (ref 65–99)
Glucose-Capillary: 209 mg/dL — ABNORMAL HIGH (ref 65–99)
Glucose-Capillary: 215 mg/dL — ABNORMAL HIGH (ref 65–99)
Glucose-Capillary: 222 mg/dL — ABNORMAL HIGH (ref 65–99)
Glucose-Capillary: 222 mg/dL — ABNORMAL HIGH (ref 65–99)
Glucose-Capillary: 261 mg/dL — ABNORMAL HIGH (ref 65–99)
Glucose-Capillary: 261 mg/dL — ABNORMAL HIGH (ref 65–99)
Glucose-Capillary: 277 mg/dL — ABNORMAL HIGH (ref 65–99)
Glucose-Capillary: 286 mg/dL — ABNORMAL HIGH (ref 65–99)
Glucose-Capillary: 306 mg/dL — ABNORMAL HIGH (ref 65–99)
Glucose-Capillary: 327 mg/dL — ABNORMAL HIGH (ref 65–99)

## 2017-12-02 LAB — HEMOGLOBIN A1C
HEMOGLOBIN A1C: 14 % — AB (ref 4.8–5.6)
MEAN PLASMA GLUCOSE: 355.1 mg/dL

## 2017-12-02 MED ORDER — POTASSIUM CHLORIDE CRYS ER 20 MEQ PO TBCR
80.0000 meq | EXTENDED_RELEASE_TABLET | Freq: Once | ORAL | Status: AC
  Administered 2017-12-02: 80 meq via ORAL
  Filled 2017-12-02: qty 4

## 2017-12-02 MED ORDER — POTASSIUM PHOSPHATES 15 MMOLE/5ML IV SOLN
60.0000 mmol | Freq: Once | INTRAVENOUS | Status: AC
  Administered 2017-12-02: 60 mmol via INTRAVENOUS
  Filled 2017-12-02: qty 20

## 2017-12-02 MED ORDER — CLONIDINE HCL 0.1 MG PO TABS
0.2000 mg | ORAL_TABLET | Freq: Two times a day (BID) | ORAL | Status: DC
  Administered 2017-12-02 – 2017-12-04 (×4): 0.2 mg via ORAL
  Filled 2017-12-02 (×4): qty 2

## 2017-12-02 MED ORDER — K PHOS MONO-SOD PHOS DI & MONO 155-852-130 MG PO TABS
500.0000 mg | ORAL_TABLET | ORAL | Status: AC
  Administered 2017-12-02 – 2017-12-03 (×4): 500 mg via ORAL
  Filled 2017-12-02 (×4): qty 2

## 2017-12-02 MED ORDER — INSULIN ASPART 100 UNIT/ML ~~LOC~~ SOLN
0.0000 [IU] | Freq: Three times a day (TID) | SUBCUTANEOUS | Status: DC
  Administered 2017-12-03: 13:00:00 20 [IU] via SUBCUTANEOUS
  Administered 2017-12-03 (×2): 7 [IU] via SUBCUTANEOUS
  Administered 2017-12-04: 11 [IU] via SUBCUTANEOUS
  Administered 2017-12-04: 7 [IU] via SUBCUTANEOUS
  Filled 2017-12-02 (×5): qty 1

## 2017-12-02 MED ORDER — METOPROLOL TARTRATE 50 MG PO TABS
50.0000 mg | ORAL_TABLET | Freq: Two times a day (BID) | ORAL | Status: DC
  Administered 2017-12-03 – 2017-12-04 (×3): 50 mg via ORAL
  Filled 2017-12-02 (×3): qty 1

## 2017-12-02 MED ORDER — INSULIN ASPART 100 UNIT/ML ~~LOC~~ SOLN
12.0000 [IU] | Freq: Three times a day (TID) | SUBCUTANEOUS | Status: DC
  Administered 2017-12-03 – 2017-12-04 (×5): 12 [IU] via SUBCUTANEOUS
  Filled 2017-12-02 (×5): qty 1

## 2017-12-02 MED ORDER — ORAL CARE MOUTH RINSE
15.0000 mL | Freq: Two times a day (BID) | OROMUCOSAL | Status: DC
  Administered 2017-12-02 – 2017-12-04 (×3): 15 mL via OROMUCOSAL

## 2017-12-02 MED ORDER — INSULIN GLARGINE 100 UNIT/ML ~~LOC~~ SOLN
40.0000 [IU] | Freq: Every day | SUBCUTANEOUS | Status: DC
  Administered 2017-12-02 – 2017-12-03 (×2): 40 [IU] via SUBCUTANEOUS
  Filled 2017-12-02 (×2): qty 0.4

## 2017-12-02 MED ORDER — POTASSIUM CHLORIDE CRYS ER 20 MEQ PO TBCR
20.0000 meq | EXTENDED_RELEASE_TABLET | ORAL | Status: AC
  Administered 2017-12-02 – 2017-12-03 (×2): 20 meq via ORAL
  Filled 2017-12-02 (×2): qty 1

## 2017-12-02 MED ORDER — SODIUM CHLORIDE 0.9 % IV BOLUS (SEPSIS)
2000.0000 mL | Freq: Once | INTRAVENOUS | Status: AC
  Administered 2017-12-02: 2000 mL via INTRAVENOUS

## 2017-12-02 MED ORDER — INSULIN ASPART 100 UNIT/ML ~~LOC~~ SOLN: 0.0000 [IU] | Freq: Every day | SUBCUTANEOUS | Status: DC

## 2017-12-02 NOTE — Progress Notes (Signed)
Adm yesterday with a diagnosis of "DKA".  However, his serum bicarb was 26 on admission.  His anion gap was elevated at 20, however.  This suggests he did have both a metabolic alkalosis and a metabolic acidosis simultaneously.  He remains on insulin infusion at a very high rate (27 units/h).  He was also noted to be bradycardic this morning.  We held the insulin for an hour and resume did not 5 units/h.  Diabetes coordinator is assisting in his management.  I have requested endocrinology consultation.  For the bradycardia, I decreased his metoprolol to 50 mg twice a day (from 100 mg twice daily) and decreased clonidine from 0.3 mg twice daily to 0.2 mg twice daily  Once he is off the insulin drip, he may be transferred to [place] floor.  Due to the profound insulin resistance, I think he would be benefited from input from endocrinology prior to discharge and follow-up with endocrinology after discharge.  Merton Border, MD PCCM service Mobile [phone] Pager [phone] 12/02/2017 2:01 PM

## 2017-12-02 NOTE — Progress Notes (Addendum)
MEDICATION RELATED CONSULT NOTE - INITIAL   Pharmacy Consult for electrolyte management Indication: hypokalemia on insulin infusion  Allergies  Allergen Reactions  . Lisinopril     Patient Measurements: Height: 6\' 1"  (185.4 cm) Weight: 300 lb (136.1 kg) IBW/kg (Calculated) : 79.9 Adjusted Body Weight:   Vital Signs: Temp: 97.7 F (36.5 C) (03/20 0800) Temp Source: Oral (03/20 0800) BP: 122/73 (03/20 1805) Pulse Rate: 67 (03/20 1805) Intake/Output from previous day: 03/19 0701 - 03/20 0700 In: 3586.9 [I.V.:3066.9; IV Piggyback:520] Out: 975 [Urine:975] Intake/Output from this shift: Total I/O In: 375 [I.V.:375] Out: 1300 [Urine:1300]  Labs: Recent Labs    12/01/17 1110 12/01/17 1405  12/02/17 0058 12/02/17 0322 12/02/17 0917 12/02/17 1645  WBC 7.7 8.7  --   --   --   --   --   HGB 17.1 15.1  --   --   --   --   --   HCT 50.7 45.2  --   --   --   --   --   PLT 227 210  --   --   --   --   --   CREATININE 2.31* 2.40*   < > 2.28* 1.97* 1.91* 1.78*  MG  --   --   --  2.3  --   --   --   PHOS  --   --   --  2.3*  --   --   --    < > = values in this interval not displayed.   Estimated Creatinine Clearance: 61.5 mL/min (A) (by C-G formula based on SCr of 1.78 mg/dL (H)).   Microbiology: Recent Results (from the past 720 hour(s))  MRSA PCR Screening     Status: None   Collection Time: 12/01/17  1:58 PM  Result Value Ref Range Status   MRSA by PCR NEGATIVE NEGATIVE Final    Comment:        The GeneXpert MRSA Assay (FDA approved for NASAL specimens only), is one component of a comprehensive MRSA colonization surveillance program. It is not intended to diagnose MRSA infection nor to guide or monitor treatment for MRSA infections. Performed at [facility], [address] [place], [place] [address]     Medical History: Past Medical History:  Diagnosis Date  . Allergy    Seasonal  . CHF (congestive heart failure) ([place])   . DKA (diabetic  ketoacidoses) ([place]) 12/01/2017  . Heart murmur   . Hypertension     Medications:  Infusions:  . insulin (NOVOLIN-R) infusion 18.5 Units/hr (12/01/17 1805)  . insulin (NOVOLIN-R) infusion 10.3 Units/hr (12/02/17 1831)    Assessment: 20 yom cc palpitation and near syncope with PMH HTN and maybe CHF. Noted to be in DKA, now on insulin drip. Pharmacy consulted to manage electrolytes.  Goal of Therapy:  Electrolytes WNL.  Plan:  Check add-on Mg/phos. Patient has received 60 mmol IV KPhos and 180 mEq KCl in approximately 24 hours with very little response in K. Will continue to follow. Give Klor-con 20 mEq po Q4H x 2 doses and recheck all electrolytes tomorrow with AM labs.  12/02/17 19:55 add on phos 2, Mg 2.2. Add K Phos neutra tab 500 mg po Q4H x 4 doses. Recheck all electrolytes tomorrow with AM labs.  Laural Benes, Pharm.D., BCPS Clinical Pharmacist 12/02/2017,6:51 PM

## 2017-12-02 NOTE — Progress Notes (Addendum)
Inpatient Diabetes Program Recommendations  AACE/ADA: New Consensus Statement on Inpatient Glycemic Control (2015)  Target Ranges:  Prepandial:   less than 140 mg/dL      Peak postprandial:   less than 180 mg/dL (1-2 hours)      Critically ill patients:  140 - 180 mg/dL   Lab Results  Component Value Date   GLUCAP 277 (H) 12/02/2017   HGBA1C 14.0 (H) 12/02/2017    Review of Glycemic ControlResults for Steve Buck, Steve Buck (MRN [id number]) as of 12/02/2017 10:19  Ref. Range 12/02/2017 06:08 12/02/2017 07:13 12/02/2017 08:14 12/02/2017 09:14 12/02/2017 10:05  Glucose-Capillary Latest Ref Range: 65 - 99 mg/dL 158 (H) 143 (H) 179 (H) 229 (H) 277 (H)    Diabetes history: Prediabetes- New onset DM Outpatient Diabetes medications:  None Current orders for Inpatient glycemic control: IV insulin Inpatient Diabetes Program Recommendations:    Note that patient's insulin needs have been high on insulin drip. He is eating CHO modified diet which may explain increased blood sugars/insulin drip rates after meals.  However overnight, insulin drip rates were >10 units per hour.  Patient appears to have resistance to insulin.  Upon transition off insulin drip, consider Lantus 40 units daily (0.3 units/kg), Novolog 12 units tid with meals and Novolog resistant correction q 4 hours.   Will also place referral for case management consult due to no insurance coverage.  May need more affordable insulin regimen at d/c such as Novolin 70/30 which can be purchased from [facility] for 25$ a vial.  Will speak to patient/family today.   Thanks,  Adah Perl, RN, BC-ADM Inpatient Diabetes Coordinator Pager [phone] (8a-5p)   12:30 addendum: Spoke with patient regarding new diagnosis of DM.  We discussed Type 2 DM and insulin resistance.  Further we discussed likely need for insulin based on A1C of 14%.  Patient admits that he has been drinking lots of sugary beverages due to extreme thirst. We discussed signs,  symptoms and treatment for hyperglycemia including frequent thirst, frequent urination, and extreme tiredness.  Patient now understands that this is due to his elevated blood sugars.  We further discussed complications of DM and goal A1C.  Patient has been reading Living Well with DM booklet at bedside.  We have discussed normal blood sugar values, monitoring (when to check blood sugars) and the importance of follow up.  [facility] and already obtains his medications from the medication management clinic. Therefore he can hopefully get meds and follow-up there after d/c.  Reviewed survival skills of DM including diet, exercise, and foot care as well.  Patient very engaged and states "I'm going to stop drinking so much sugar". Will place dietician consult as well.    Called medication management clinic. They state that patient is inactive however will likely be eligible for medications at d/c.  They do have Lantus and Humalog in stock at this time if patient is d/c'd on this.    Thanks,  Adah Perl, RN, BC-ADM Inpatient Diabetes Coordinator Pager [phone] (8a-5p)

## 2017-12-02 NOTE — Progress Notes (Signed)
[place] [facility] [place] NAME: Steve Buck    MR#:  [id number]  DATE OF BIRTH:  1956-01-31  SUBJECTIVE:  CHIEF COMPLAINT:   Chief Complaint  Patient presents with  . Palpitations  . Near Syncope  Doing well, in discussion with intensivist-for probable floor later today  REVIEW OF SYSTEMS:  CONSTITUTIONAL: No fever, fatigue or weakness.  EYES: No blurred or double vision.  EARS, NOSE, AND THROAT: No tinnitus or ear pain.  RESPIRATORY: No cough, shortness of breath, wheezing or hemoptysis.  CARDIOVASCULAR: No chest pain, orthopnea, edema.  GASTROINTESTINAL: No nausea, vomiting, diarrhea or abdominal pain.  GENITOURINARY: No dysuria, hematuria.  ENDOCRINE: No polyuria, nocturia,  HEMATOLOGY: No anemia, easy bruising or bleeding SKIN: No rash or lesion. MUSCULOSKELETAL: No joint pain or arthritis.   NEUROLOGIC: No tingling, numbness, weakness.  PSYCHIATRY: No anxiety or depression.   ROS  DRUG ALLERGIES:   Allergies  Allergen Reactions  . Lisinopril     VITALS:  Blood pressure 104/67, pulse (!) 53, temperature 97.7 F (36.5 C), temperature source Oral, resp. rate 14, height 6\' 1"  (1.854 m), weight 136.1 kg (300 lb), SpO2 94 %.  PHYSICAL EXAMINATION:  GENERAL:  62 y.o.-year-old patient lying in the bed with no acute distress.  EYES: Pupils equal, round, reactive to light and accommodation. No scleral icterus. Extraocular muscles intact.  HEENT: Head atraumatic, normocephalic. Oropharynx and nasopharynx clear.  NECK:  Supple, no jugular venous distention. No thyroid enlargement, no tenderness.  LUNGS: Normal breath sounds bilaterally, no wheezing, rales,rhonchi or crepitation. No use of accessory muscles of respiration.  CARDIOVASCULAR: S1, S2 normal. No murmurs, rubs, or gallops.  ABDOMEN: Soft, nontender, nondistended. Bowel sounds present. No organomegaly or mass.  EXTREMITIES: No pedal edema, cyanosis, or clubbing.   NEUROLOGIC: Cranial nerves II through XII are intact. Muscle strength 5/5 in all extremities. Sensation intact. Gait not checked.  PSYCHIATRIC: The patient is alert and oriented x 3.  SKIN: No obvious rash, lesion, or ulcer.   Physical Exam LABORATORY PANEL:   CBC Recent Labs  Lab 12/01/17 1405  WBC 8.7  HGB 15.1  HCT 45.2  PLT 210   ------------------------------------------------------------------------------------------------------------------  Chemistries  Recent Labs  Lab 12/02/17 0058  12/02/17 0917  NA 137   < > 135  K 2.8*   < > 3.5  CL 93*   < > 97*  CO2 31   < > 30  GLUCOSE 264*   < > 275*  BUN 50*   < > 44*  CREATININE 2.28*   < > 1.91*  CALCIUM 8.7*   < > 8.3*  MG 2.3  --   --    < > = values in this interval not displayed.   ------------------------------------------------------------------------------------------------------------------  Cardiac Enzymes No results for input(s): TROPONINI in the last 168 hours. ------------------------------------------------------------------------------------------------------------------  RADIOLOGY:  Dg Chest 2 View  Result Date: 12/01/2017 CLINICAL DATA:  Tachycardia.  Hypoxia.  Presyncope. EXAM: CHEST - 2 VIEW COMPARISON:  None. FINDINGS: Heart is mildly enlarged. Mild pulmonary vascular congestion is present without frank edema. No effusions are present. No focal airspace consolidation is present. Exaggerated thoracic kyphosis and mild endplate degenerative changes are noted. The visualized soft tissues and bony thorax are otherwise unremarkable. IMPRESSION: 1. Mild cardiomegaly and pulmonary vascular congestion without frank edema. This raises concern for early congestive heart failure. Electronically Signed   By: San Morelle M.D.   On: 12/01/2017 12:36    ASSESSMENT AND PLAN:  Patient 62 year old white male presenting with dizziness near syncope  1.   Acute hyperosmolar nonketotic state Secondary to  newly diagnosed diabetes mellitus type 2 In discussion with intensivist, probable transfer to the floor later today, wean off insulin drip, continue IV fluids rehydration, diabetic educator while in house  2. AKI w/ CKD Secondary to above IV fluids for rehydration, avoid nephrotoxic agents, Strict I&O monitoring  3.  Hypokalemia, acute yesterday as she is Replete with IV fluids and p.o. potassium   4.  Hyponatremia Secondary to above resolved with IV fluids for rehydration  5.  Essential hypertension Stable on current regiment  6.  Chronic gout without exacerbation  Continue allopurinol  Full code  Condition stable DVT prophylaxis with heparin subcu Disposition home in 1-2 days barring any complications  All the records are reviewed and case discussed with Care Management/Social Workerr. Management plans discussed with the patient, family and they are in agreement.  CODE STATUS: full  TOTAL TIME TAKING CARE OF THIS PATIENT: 35 minutes.     POSSIBLE D/C IN 1-2 DAYS, DEPENDING ON CLINICAL CONDITION.   Avel Peace Lois Slagel M.D on 12/02/2017   Between 7am to 6pm - Pager - [phone]  After 6pm go to [email or web address] - password [email or web address] [place] [facility]  Office  [phone]  CC: Primary care physician; [employer]  Note: This dictation was prepared with Dragon dictation along with smaller phrase technology. Any transcriptional errors that result from this process are unintentional.

## 2017-12-02 NOTE — Progress Notes (Signed)
Pt anion gap and serum CO2 within normal limits on last BMP check.  However pt remains hyperglycemic while on insulin gtt.  Per Dr. Alva Garnet, stop D51/2NS and hold insulin gtt for one hour, then restart glucose stabilizer.  Per Dr. Alva Garnet, await for Diabetes Coordinator recommendations regarding conversion to SQ insulin.

## 2017-12-02 NOTE — Progress Notes (Signed)
Have spoken with Diabetes coordinator Rubie Maid and Dr. Alva Garnet.  Plan is once pt CBG maintaining <200, will convert off insulin gtt to SQ.  Pt to receive 40 units Lantus, and Novolog 12 units TID with sliding scale (resistant) coverage.  Pt to remain on insulin for 2 additional hours after Lantus given.

## 2017-12-03 LAB — BASIC METABOLIC PANEL
ANION GAP: 11 (ref 5–15)
BUN: 37 mg/dL — ABNORMAL HIGH (ref 6–20)
CALCIUM: 8.3 mg/dL — AB (ref 8.9–10.3)
CO2: 31 mmol/L (ref 22–32)
Chloride: 97 mmol/L — ABNORMAL LOW (ref 101–111)
Creatinine, Ser: 1.7 mg/dL — ABNORMAL HIGH (ref 0.61–1.24)
GFR, EST AFRICAN AMERICAN: 48 mL/min — AB (ref >60)
GFR, EST NON AFRICAN AMERICAN: 41 mL/min — AB (ref >60)
GLUCOSE: 223 mg/dL — AB (ref 65–99)
POTASSIUM: 3.1 mmol/L — AB (ref 3.5–5.1)
Sodium: 139 mmol/L (ref 135–145)

## 2017-12-03 LAB — GLUCOSE, CAPILLARY
GLUCOSE-CAPILLARY: 390 mg/dL — AB (ref 65–99)
GLUCOSE-CAPILLARY: 209 mg/dL — AB (ref 65–99)
Glucose-Capillary: 141 mg/dL — ABNORMAL HIGH (ref 65–99)
Glucose-Capillary: 184 mg/dL — ABNORMAL HIGH (ref 65–99)
Glucose-Capillary: 225 mg/dL — ABNORMAL HIGH (ref 65–99)

## 2017-12-03 LAB — MAGNESIUM: Magnesium: 2.2 mg/dL (ref 1.7–2.4)

## 2017-12-03 LAB — C-PEPTIDE: C-Peptide: 4.8 ng/mL — ABNORMAL HIGH (ref 1.1–4.4)

## 2017-12-03 LAB — PHOSPHORUS: PHOSPHORUS: 4.1 mg/dL (ref 2.5–4.6)

## 2017-12-03 MED ORDER — GABAPENTIN 300 MG PO CAPS
300.0000 mg | ORAL_CAPSULE | Freq: Every day | ORAL | Status: DC
  Administered 2017-12-03: 23:00:00 300 mg via ORAL
  Filled 2017-12-03: qty 1

## 2017-12-03 MED ORDER — INSULIN GLARGINE 100 UNIT/ML ~~LOC~~ SOLN
50.0000 [IU] | Freq: Every day | SUBCUTANEOUS | Status: DC
  Administered 2017-12-04: 09:00:00 50 [IU] via SUBCUTANEOUS
  Filled 2017-12-03 (×3): qty 0.5

## 2017-12-03 MED ORDER — GABAPENTIN 100 MG PO CAPS
100.0000 mg | ORAL_CAPSULE | Freq: Two times a day (BID) | ORAL | Status: DC
  Administered 2017-12-03 – 2017-12-04 (×3): 100 mg via ORAL
  Filled 2017-12-03 (×3): qty 1

## 2017-12-03 MED ORDER — POTASSIUM CHLORIDE CRYS ER 20 MEQ PO TBCR
40.0000 meq | EXTENDED_RELEASE_TABLET | ORAL | Status: AC
Start: 2017-12-03 — End: 2017-12-03
  Administered 2017-12-03 (×2): 40 meq via ORAL
  Filled 2017-12-03 (×2): qty 2

## 2017-12-03 MED ORDER — AMITRIPTYLINE HCL 25 MG PO TABS
25.0000 mg | ORAL_TABLET | Freq: Every day | ORAL | Status: DC
  Administered 2017-12-03: 23:00:00 25 mg via ORAL
  Filled 2017-12-03: qty 1

## 2017-12-03 NOTE — Plan of Care (Signed)
  RD consulted for nutrition education regarding diabetes.   Lab Results  Component Value Date   HGBA1C 14.0 (H) 12/02/2017   Met with patient at bedside. He reports that initially he had a decreased appetite when he first came into the hospital but he is feeling much better now. His appetite is back to normal and he is eating well at meals. He typically eats 2-3 meals daily. Before he was admitted he was feeling very thirsty and drank a significant amount of soda because he did not know he was actually experiencing polydipsia from hyperglycemia. He is now very motivated to eat healthy foods, start exercising again, and manage his diabetes.   RD provided "Carbohydrate Counting for People with Diabetes" handout from the Academy of Nutrition and Dietetics. Discussed different food groups and their effects on blood sugar, emphasizing carbohydrate-containing foods. Provided list of carbohydrates and recommended serving sizes of common foods.  RD provided "Using Nutrition Labels: Carbohydrates" handout from the Academy of [employer] and Dietetics. Discussed how to read a nutrition label including looking at serving size and servings per container. Reviewed that there are 15 grams of carbohydrate in one carbohydrate choice. Encouraged patient to use chart for range of carbohydrate grams per choice when reading nutrition labels.  Discussed importance of controlled and consistent carbohydrate intake throughout the day. Provided examples of ways to balance meals/snacks and encouraged intake of high-fiber, whole grain complex carbohydrates. Teach back method used.  Expect good compliance.   Body mass index is 39.58 kg/m. Pt meets criteria for Obesity Class II based on current BMI.  Current diet order is Carbohydrate Modified, patient is consuming approximately 100% of meals at this time. Labs and medications reviewed. No further nutrition interventions warranted at this time. RD contact information provided.  If additional nutrition issues arise, please re-consult RD.  Willey Blade, MS, [place], LDN Office: [phone] Pager: [phone] After Hours/Weekend Pager: [phone]

## 2017-12-03 NOTE — Plan of Care (Signed)

## 2017-12-03 NOTE — Progress Notes (Signed)
Inpatient Diabetes Program Recommendations  AACE/ADA: New Consensus Statement on Inpatient Glycemic Control (2015)  Target Ranges:  Prepandial:   less than 140 mg/dL      Peak postprandial:   less than 180 mg/dL (1-2 hours)      Critically ill patients:  140 - 180 mg/dL   Lab Results  Component Value Date   GLUCAP 390 (H) 12/03/2017   HGBA1C 14.0 (H) 12/02/2017    Review of Glycemic ControlResults for JERMAIN, CURT (MRN [id number]) as of 12/03/2017 16:29  Ref. Range 12/03/2017 00:38 12/03/2017 07:53 12/03/2017 11:40  Glucose-Capillary Latest Ref Range: 65 - 99 mg/dL 141 (H) 225 (H) 390 (H)    Diabetes history: Type 2 DM- New onset Current orders for Inpatient glycemic control:  Lantus 50 units daily, Novolog resistant tid with meals and HS, Novolog 12 units tid with meals Inpatient Diabetes Program Recommendations:   Please consider increasing Novolog meal coverage to 15 units tid with meals.  Talked with RN.  She states that patient has been self-administering insulin today.  She will continue to work with patient on insulin administration.   Thanks,  Adah Perl, RN, BC-ADM Inpatient Diabetes Coordinator Pager [phone] (8a-5p)

## 2017-12-03 NOTE — Progress Notes (Signed)
[facility] [place] NAME: Steve Buck    MR#:  [id number]  DATE OF BIRTH:  June 13, 1956  SUBJECTIVE:  CHIEF COMPLAINT:   Chief Complaint  Patient presents with  . Palpitations  . Near Syncope  No events overnight, discussed with intensivist-4 floor later today, no beds floor beds on yesterday  REVIEW OF SYSTEMS:  CONSTITUTIONAL: No fever, fatigue or weakness.  EYES: No blurred or double vision.  EARS, NOSE, AND THROAT: No tinnitus or ear pain.  RESPIRATORY: No cough, shortness of breath, wheezing or hemoptysis.  CARDIOVASCULAR: No chest pain, orthopnea, edema.  GASTROINTESTINAL: No nausea, vomiting, diarrhea or abdominal pain.  GENITOURINARY: No dysuria, hematuria.  ENDOCRINE: No polyuria, nocturia,  HEMATOLOGY: No anemia, easy bruising or bleeding SKIN: No rash or lesion. MUSCULOSKELETAL: No joint pain or arthritis.   NEUROLOGIC: No tingling, numbness, weakness.  PSYCHIATRY: No anxiety or depression.   ROS  DRUG ALLERGIES:   Allergies  Allergen Reactions  . Lisinopril     VITALS:  Blood pressure 138/80, pulse 85, temperature 98.1 F (36.7 C), temperature source Oral, resp. rate (!) 27, height 6\' 1"  (1.854 m), weight 136.1 kg (300 lb), SpO2 97 %.  PHYSICAL EXAMINATION:  GENERAL:  62 y.o.-year-old patient lying in the bed with no acute distress.  EYES: Pupils equal, round, reactive to light and accommodation. No scleral icterus. Extraocular muscles intact.  HEENT: Head atraumatic, normocephalic. Oropharynx and nasopharynx clear.  NECK:  Supple, no jugular venous distention. No thyroid enlargement, no tenderness.  LUNGS: Normal breath sounds bilaterally, no wheezing, rales,rhonchi or crepitation. No use of accessory muscles of respiration.  CARDIOVASCULAR: S1, S2 normal. No murmurs, rubs, or gallops.  ABDOMEN: Soft, nontender, nondistended. Bowel sounds present. No organomegaly or mass.  EXTREMITIES: No pedal edema,  cyanosis, or clubbing.  NEUROLOGIC: Cranial nerves II through XII are intact. Muscle strength 5/5 in all extremities. Sensation intact. Gait not checked.  PSYCHIATRIC: The patient is alert and oriented x 3.  SKIN: No obvious rash, lesion, or ulcer.   Physical Exam LABORATORY PANEL:   CBC Recent Labs  Lab 12/01/17 1405  WBC 8.7  HGB 15.1  HCT 45.2  PLT 210   ------------------------------------------------------------------------------------------------------------------  Chemistries  Recent Labs  Lab 12/03/17 0439  NA 139  K 3.1*  CL 97*  CO2 31  GLUCOSE 223*  BUN 37*  CREATININE 1.70*  CALCIUM 8.3*  MG 2.2   ------------------------------------------------------------------------------------------------------------------  Cardiac Enzymes No results for input(s): TROPONINI in the last 168 hours. ------------------------------------------------------------------------------------------------------------------  RADIOLOGY:  No results found.  ASSESSMENT AND PLAN:  Patient 62 year old white male presenting with dizziness near syncope  1.  Acute hyperosmolar nonketotic state Resolved Secondary to newly diagnosed diabetes mellitus type 2 In discussion with intensivist, transfer to the floor later today, IV fluids rehydration, diabetic educator consulted while in house  2. AKI w/ CKD Improved Secondary to above Continue IV fluids for rehydration, avoid nephrotoxic agents, Strict I&O monitoring, and check BMP in the morning  3.Hypokalemia Replete with p.o. potassium/IV fluids, and check BMP in am   4.Hyponatremia Resolved with IV fluids for rehydration  5.Essential hypertension Stable on current regiment  6.  Chronic gout without exacerbation  Continue allopurinol  Full code  Condition stable DVT prophylaxis with heparin subcu Disposition home in 1-2 days barring any complications  All the records are reviewed and case discussed with Care  Management/Social Workerr. Management plans discussed with the patient, family and they are in agreement.  CODE  STATUS: full   TOTAL TIME TAKING CARE OF THIS PATIENT: 35 minutes.     POSSIBLE D/C IN 1-2 DAYS, DEPENDING ON CLINICAL CONDITION.   Avel Peace Salary M.D on 12/03/2017   Between 7am to 6pm - Pager - [phone]  After 6pm go to [email or web address] - password [email or web address] [facility]  Office  [phone]  CC: Primary care physician; [employer] [place] [employer]  Note: This dictation was prepared with Dragon dictation along with smaller phrase technology. Any transcriptional errors that result from this process are unintentional.

## 2017-12-03 NOTE — Progress Notes (Signed)
Pharmacy Electrolyte Monitoring Consult:  Pharmacy consulted to assist in monitoring and replacing electrolytes in this 62 y.o. male admitted on 12/01/2017 with Palpitations and Near Syncope Patient admitted in DKA and started on insulin drip requiring potassium replacement.   Labs:  Sodium (mmol/L)  Date Value  12/03/2017 139  03/24/2017 142   Potassium (mmol/L)  Date Value  12/03/2017 3.1 (L)   Magnesium (mg/dL)  Date Value  12/03/2017 2.2   Phosphorus (mg/dL)  Date Value  12/03/2017 4.1   Calcium (mg/dL)  Date Value  12/03/2017 8.3 (L)   Albumin (g/dL)  Date Value  01/20/2017 3.9    Plan: Will order KCl 40 meq po x 2 doses today and f/u AM labs.   Ulice Dash D 12/03/2017 8:32 AM

## 2017-12-03 NOTE — Progress Notes (Signed)
Report given to Dedra, RN and personal belongings 2 bags transferred with patient to room 130 and patient stated he would call his wife to update on new room.

## 2017-12-04 LAB — BASIC METABOLIC PANEL
Anion gap: 10 (ref 5–15)
BUN: 26 mg/dL — AB (ref 6–20)
CALCIUM: 8.3 mg/dL — AB (ref 8.9–10.3)
CHLORIDE: 98 mmol/L — AB (ref 101–111)
CO2: 29 mmol/L (ref 22–32)
CREATININE: 1.35 mg/dL — AB (ref 0.61–1.24)
GFR calc Af Amer: >60 mL/min (ref >60)
GFR calc non Af Amer: 54 mL/min — ABNORMAL LOW (ref >60)
Glucose, Bld: 219 mg/dL — ABNORMAL HIGH (ref 65–99)
Potassium: 2.8 mmol/L — ABNORMAL LOW (ref 3.5–5.1)
Sodium: 137 mmol/L (ref 135–145)

## 2017-12-04 LAB — GLUCOSE, CAPILLARY
GLUCOSE-CAPILLARY: 231 mg/dL — AB (ref 65–99)
Glucose-Capillary: 265 mg/dL — ABNORMAL HIGH (ref 65–99)

## 2017-12-04 MED ORDER — INSULIN ASPART 100 UNIT/ML ~~LOC~~ SOLN: 15.0000 [IU] | Freq: Three times a day (TID) | SUBCUTANEOUS | 1 refills | Status: DC

## 2017-12-04 MED ORDER — INSULIN DETEMIR 100 UNIT/ML ~~LOC~~ SOLN: 50.0000 [IU] | Freq: Every day | SUBCUTANEOUS | 1 refills | Status: DC

## 2017-12-04 MED ORDER — AMITRIPTYLINE HCL 25 MG PO TABS: 25.0000 mg | ORAL_TABLET | Freq: Every day | ORAL | 0 refills | Status: DC

## 2017-12-04 MED ORDER — GABAPENTIN 100 MG PO CAPS: 100.0000 mg | ORAL_CAPSULE | Freq: Two times a day (BID) | ORAL | 0 refills | Status: DC

## 2017-12-04 MED ORDER — METFORMIN HCL 500 MG PO TABS
500.0000 mg | ORAL_TABLET | Freq: Two times a day (BID) | ORAL | Status: DC
  Administered 2017-12-04: 09:00:00 500 mg via ORAL
  Filled 2017-12-04 (×2): qty 1

## 2017-12-04 MED ORDER — INSULIN GLARGINE 100 UNIT/ML ~~LOC~~ SOLN: 50.0000 [IU] | Freq: Every day | SUBCUTANEOUS | 1 refills | Status: DC

## 2017-12-04 MED ORDER — GABAPENTIN 300 MG PO CAPS: 300.0000 mg | ORAL_CAPSULE | Freq: Every day | ORAL | 0 refills | Status: DC

## 2017-12-04 MED ORDER — POTASSIUM CHLORIDE 20 MEQ PO PACK
60.0000 meq | PACK | Freq: Once | ORAL | Status: AC
  Administered 2017-12-04: 09:00:00 60 meq via ORAL
  Filled 2017-12-04: qty 3

## 2017-12-04 MED ORDER — INSULIN STARTER KIT- SYRINGES (ENGLISH)
1.0000 | Freq: Once | Status: AC
  Administered 2017-12-04: 1
  Filled 2017-12-04: qty 1

## 2017-12-04 MED ORDER — METFORMIN HCL 500 MG PO TABS: 500.0000 mg | ORAL_TABLET | Freq: Two times a day (BID) | ORAL | 0 refills | Status: DC

## 2017-12-04 MED ORDER — INSULIN LISPRO 100 UNIT/ML ~~LOC~~ SOLN: 15.0000 [IU] | Freq: Three times a day (TID) | SUBCUTANEOUS | 1 refills | Status: DC

## 2017-12-04 NOTE — Progress Notes (Signed)
Pt is being discharge home. Discharge papers given and explained to pt and spouse, both verbalized understanding. Meds and f/u appointments reviewed. RX given.

## 2017-12-04 NOTE — Progress Notes (Signed)
Inpatient Diabetes Program Recommendations  AACE/ADA: New Consensus Statement on Inpatient Glycemic Control (2015)  Target Ranges:  Prepandial:   less than 140 mg/dL      Peak postprandial:   less than 180 mg/dL (1-2 hours)      Critically ill patients:  140 - 180 mg/dL   Lab Results  Component Value Date   GLUCAP 265 (H) 12/04/2017   HGBA1C 14.0 (H) 12/02/2017    Review of Glycemic ControlResults for JESSUP, OGAS (MRN [id number]) as of 12/04/2017 14:00  Ref. Range 12/03/2017 11:40 12/03/2017 17:07 12/03/2017 21:56 12/04/2017 07:58 12/04/2017 11:50  Glucose-Capillary Latest Ref Range: 65 - 99 mg/dL 390 (H) 209 (H) 184 (H) 231 (H) 265 (H)    Diabetes history: New onset Type 2 DM Current orders for Inpatient glycemic control:  Novolog resistant tid with meals and HS, Novolog 12 units tid with meals, Lantus 50 units daily  Inpatient Diabetes Program Recommendations:    Call received from RN stating that patient has lots of questions regarding DM.  Met with patient and his son and reviewed DM teaching.  Patient has been administering his own insulin however he has not drawn it up yet.  Allowed patient to practice drawing up insulin using Normal Saline vial including cleaning top of vial, putting in air, drawing up insulin, checking for bubbles, etc.  Patient was able to demonstrate.  We further reviewed hypoglycemia signs and symptoms and how to treat.  He plans to get his medications from the Medication management clinic.  Case manager gave patient a meter (reli-on).  I briefly reviewed use with patient.  We further discussed diet, exercise and when to call MD.  Patient states that he is motivated to eat better, exercise and possibly reduce his need for insulin.  We also discussed Metformin and its mechanism of action.  We discussed insulin types including Levemir- LONG acting/basal and Humalog- fast acting/  Patient states that "hurry" helps him remember that it is quick acting.  We reviewed  normal blood sugar values and to call MD if blood sugars consistently less than 100 mg/dL.  Wife also came at the end of my visit and I reviewed information with her as well.   Thanks,  Adah Perl, RN, BC-ADM Inpatient Diabetes Coordinator Pager [phone] (8a-5p)

## 2017-12-04 NOTE — Care Management (Signed)
Patient is discharge home today.  He has no insurance.  He is known to [facility] but sporadically.  Provided him applications for both.  Faxed his discharge prescriptions to the clinic and patient verbalizes understanding to go to the clinic at discharge and obtain his medications and insulin.  Provided patient with glucose monitor kit

## 2017-12-04 NOTE — Discharge Summary (Addendum)
[facility] at [place] NAME: Steve Buck    MR#:  [id number]  DATE OF BIRTH:  1956-05-28  DATE OF ADMISSION:  12/01/2017 ADMITTING PHYSICIAN: Dustin Flock, MD  DATE OF DISCHARGE: No discharge date for patient encounter.  PRIMARY CARE PHYSICIAN: Corp, Teaching laboratory technician    ADMISSION DIAGNOSIS:  Diabetic ketoacidosis without coma associated with type 2 diabetes mellitus ([place]) [E11.10] Acute kidney injury superimposed on chronic kidney disease ([place]) [N17.9, N18.9]  DISCHARGE DIAGNOSIS:  Active Problems:   DKA (diabetic ketoacidoses) ([place])   SECONDARY DIAGNOSIS:   Past Medical History:  Diagnosis Date  . Allergy    Seasonal  . CHF (congestive heart failure) ([place])   . DKA (diabetic ketoacidoses) ([place]) 12/01/2017  . Heart murmur   . Hypertension     HOSPITAL COURSE:  Patient 62 year old white male presenting with dizziness near syncope  1.Acute hyperosmolar nonketotic state Resolved Secondary to newly diagnosed diabetes mellitus type 2 Did require 1 night stay in the ICU, IV fluids for rehydration, diabetic educator while in house, placed on Lantus 50 units at bedtime, NovoLog 15 units with meals, metformin 500 mg twice daily, patient to follow with primary care provider in 3-5 days for reevaluation    2. AKI w/ CKD Resolved Secondary to above Treated with IV fluids for rehydration  3.Hypokalemia Repleted   4.Hyponatremia Resolved with IV fluids for rehydration  5.Essential hypertension Stable on current regiment  6.Chronic gout without exacerbation  Continueallopurinol   DISCHARGE CONDITIONS:  On the day of discharge patient is afebrile, hemodynamic is stable, tolerating diet, follow-up as directed, for more specific details please see chart  CONSULTS OBTAINED:    DRUG ALLERGIES:   Allergies  Allergen Reactions  . Lisinopril     DISCHARGE MEDICATIONS:   Allergies as of 12/04/2017       Reactions   Lisinopril       Medication List    TAKE these medications   allopurinol 100 MG tablet Commonly known as:  ZYLOPRIM TAKE 1 TABLET BY MOUTH TWICE DAILY   amitriptyline 25 MG tablet Commonly known as:  ELAVIL Take 1 tablet (25 mg total) by mouth at bedtime.   amLODipine 10 MG tablet Commonly known as:  NORVASC TAKE 1 TABLET BY MOUTH ONCE DAILY   aspirin EC 81 MG tablet Take 81 mg by mouth daily.   cloNIDine 0.3 MG tablet Commonly known as:  CATAPRES Take 1 tablet (0.3 mg total) by mouth 2 (two) times daily.   gabapentin 100 MG capsule Commonly known as:  NEURONTIN Take 1 capsule (100 mg total) by mouth 2 (two) times daily.   gabapentin 300 MG capsule Commonly known as:  NEURONTIN Take 1 capsule (300 mg total) by mouth at bedtime.   hydrochlorothiazide 25 MG tablet Commonly known as:  HYDRODIURIL TAKE 1 TABLET BY MOUTH ONCE DAILY   insulin detemir 100 UNIT/ML injection Commonly known as:  LEVEMIR Inject 0.5 mLs (50 Units total) into the skin at bedtime.   insulin lispro 100 UNIT/ML injection Commonly known as:  HUMALOG Inject 0.15 mLs (15 Units total) into the skin 3 (three) times daily with meals.   metFORMIN 500 MG tablet Commonly known as:  GLUCOPHAGE Take 1 tablet (500 mg total) by mouth 2 (two) times daily with a meal.   metoprolol tartrate 100 MG tablet Commonly known as:  LOPRESSOR TAKE 1 TABLET BY MOUTH TWICE DAILY   naproxen 500 MG tablet Commonly known as:  NAPROSYN Take  1 tablet (500 mg total) by mouth 2 (two) times daily with a meal.        DISCHARGE INSTRUCTIONS:   If you experience worsening of your admission symptoms, develop shortness of breath, life threatening emergency, suicidal or homicidal thoughts you must seek medical attention immediately by calling 911 or calling your MD immediately  if symptoms less severe.  You Must read complete instructions/literature along with all the possible adverse reactions/side effects  for all the Medicines you take and that have been prescribed to you. Take any new Medicines after you have completely understood and accept all the possible adverse reactions/side effects.   Please note  You were cared for by a hospitalist during your hospital stay. If you have any questions about your discharge medications or the care you received while you were in the hospital after you are discharged, you can call the unit and asked to speak with the hospitalist on call if the hospitalist that took care of you is not available. Once you are discharged, your primary care physician will handle any further medical issues. Please note that NO REFILLS for any discharge medications will be authorized once you are discharged, as it is imperative that you return to your primary care physician (or establish a relationship with a primary care physician if you do not have one) for your aftercare needs so that they can reassess your need for medications and monitor your lab values.    Today   CHIEF COMPLAINT:   Chief Complaint  Patient presents with  . Palpitations  . Near Syncope    HISTORY OF PRESENT ILLNESS:  62 y.o. male with a known history of essential hypertension, questionable CHF and morbid obesity who is presenting to the ER complaining of palpitations and near syncope.  Patient states that he has not been feeling well for the past few months.  He has had generalized weakness and fatigue and S with ambulation.  He states that he has been feeling very thirsty and has been urinating a lot.  In the emergency room he was noted to have a very high blood sugar with anion gap that is elevated. VITAL SIGNS:  Blood pressure (!) 142/69, pulse 96, temperature 97.6 F (36.4 C), temperature source Oral, resp. rate 18, height 6\' 1"  (1.854 m), weight 136.1 kg (300 lb), SpO2 97 %.  I/O:    Intake/Output Summary (Last 24 hours) at 12/04/2017 1129 Last data filed at 12/03/2017 1900 Gross per 24 hour   Intake 480 ml  Output -  Net 480 ml    PHYSICAL EXAMINATION:  GENERAL:  62 y.o.-year-old patient lying in the bed with no acute distress.  EYES: Pupils equal, round, reactive to light and accommodation. No scleral icterus. Extraocular muscles intact.  HEENT: Head atraumatic, normocephalic. Oropharynx and nasopharynx clear.  NECK:  Supple, no jugular venous distention. No thyroid enlargement, no tenderness.  LUNGS: Normal breath sounds bilaterally, no wheezing, rales,rhonchi or crepitation. No use of accessory muscles of respiration.  CARDIOVASCULAR: S1, S2 normal. No murmurs, rubs, or gallops.  ABDOMEN: Soft, non-tender, non-distended. Bowel sounds present. No organomegaly or mass.  EXTREMITIES: No pedal edema, cyanosis, or clubbing.  NEUROLOGIC: Cranial nerves II through XII are intact. Muscle strength 5/5 in all extremities. Sensation intact. Gait not checked.  PSYCHIATRIC: The patient is alert and oriented x 3.  SKIN: No obvious rash, lesion, or ulcer.   DATA REVIEW:   CBC Recent Labs  Lab 12/01/17 1405  WBC 8.7  HGB 15.1  HCT 45.2  PLT 210    Chemistries  Recent Labs  Lab 12/03/17 0439 12/04/17 0515  NA 139 137  K 3.1* 2.8*  CL 97* 98*  CO2 31 29  GLUCOSE 223* 219*  BUN 37* 26*  CREATININE 1.70* 1.35*  CALCIUM 8.3* 8.3*  MG 2.2  --     Cardiac Enzymes No results for input(s): TROPONINI in the last 168 hours.  Microbiology Results  Results for orders placed or performed during the hospital encounter of 12/01/17  MRSA PCR Screening     Status: None   Collection Time: 12/01/17  1:58 PM  Result Value Ref Range Status   MRSA by PCR NEGATIVE NEGATIVE Final    Comment:        The GeneXpert MRSA Assay (FDA approved for NASAL specimens only), is one component of a comprehensive MRSA colonization surveillance program. It is not intended to diagnose MRSA infection nor to guide or monitor treatment for MRSA infections. Performed at [facility],  [address] [place], [place] [address]     RADIOLOGY:  No results found.  EKG:   Orders placed or performed during the hospital encounter of 12/01/17  . ED EKG  . ED EKG  . EKG 12-Lead  . EKG 12-Lead  . EKG 12-Lead  . EKG 12-Lead  . ED EKG  . ED EKG  . EKG 12-Lead  . EKG 12-Lead      Management plans discussed with the patient, family and they are in agreement.  CODE STATUS:     Code Status Orders  (From admission, onward)        Start     Ordered   12/01/17 1305  Full code  Continuous     12/01/17 1304    Code Status History    This patient has a current code status but no historical code status.      TOTAL TIME TAKING CARE OF THIS PATIENT: 35 minutes.    Avel Peace Salary M.D on 12/04/2017 at 11:29 AM  Between 7am to 6pm - Pager - [phone]  After 6pm go to [email or web address] - password [email or web address] [facility]  Office  [phone]  CC: Primary care physician; [employer] [place] [employer]   Note: This dictation was prepared with Dragon dictation along with smaller phrase technology. Any transcriptional errors that result from this process are unintentional.

## 2017-12-09 ENCOUNTER — Telehealth: Payer: Self-pay

## 2017-12-09 NOTE — Telephone Encounter (Signed)
EMMI Follow-up: It was noted on the report that the patient had a question about a follow-up appointment.  I talked with the patient and he was aware of appointment on Thursday, 3/28 at 10:15 am.  He had no other concerns at this time.

## 2017-12-10 ENCOUNTER — Ambulatory Visit: Payer: Self-pay | Admitting: Adult Health

## 2017-12-10 ENCOUNTER — Encounter: Payer: Self-pay | Admitting: Adult Health

## 2017-12-10 VITALS — BP 139/84 | HR 55 | Temp 97.5°F | Ht 73.0 in | Wt 306.5 lb

## 2017-12-10 DIAGNOSIS — M109 Gout, unspecified: Secondary | ICD-10-CM

## 2017-12-10 DIAGNOSIS — E876 Hypokalemia: Secondary | ICD-10-CM

## 2017-12-10 DIAGNOSIS — E1159 Type 2 diabetes mellitus with other circulatory complications: Secondary | ICD-10-CM

## 2017-12-10 DIAGNOSIS — Z794 Long term (current) use of insulin: Secondary | ICD-10-CM

## 2017-12-10 DIAGNOSIS — E1140 Type 2 diabetes mellitus with diabetic neuropathy, unspecified: Secondary | ICD-10-CM

## 2017-12-10 DIAGNOSIS — I10 Essential (primary) hypertension: Secondary | ICD-10-CM

## 2017-12-10 DIAGNOSIS — I4892 Unspecified atrial flutter: Secondary | ICD-10-CM

## 2017-12-10 DIAGNOSIS — E1165 Type 2 diabetes mellitus with hyperglycemia: Secondary | ICD-10-CM

## 2017-12-10 MED ORDER — ALLOPURINOL 100 MG PO TABS: 100.0000 mg | ORAL_TABLET | Freq: Every day | ORAL | 3 refills | Status: DC

## 2017-12-10 MED ORDER — POTASSIUM CHLORIDE CRYS ER 20 MEQ PO TBCR
20.0000 meq | EXTENDED_RELEASE_TABLET | Freq: Two times a day (BID) | ORAL | 2 refills | Status: DC
Start: 2017-12-10 — End: 2017-12-31

## 2017-12-10 MED ORDER — LOSARTAN POTASSIUM 50 MG PO TABS: 50.0000 mg | ORAL_TABLET | Freq: Every day | ORAL | 3 refills | Status: DC

## 2017-12-10 MED ORDER — METOPROLOL TARTRATE 100 MG PO TABS: 50.0000 mg | ORAL_TABLET | Freq: Two times a day (BID) | ORAL | 3 refills | Status: DC

## 2017-12-10 NOTE — Progress Notes (Signed)
Patient: Steve Buck Male    DOB: 1956-02-17   62 y.o.   MRN: [id number] Visit Date: 12/10/2017  Today's Provider: Mary Sella, NP   Chief Complaint  Patient presents with  . Follow-up    blood sugar, blood pressure  f/u post-hospitalization for severe hyperglycemia, hypokalemia and near-syncope Subjective:    HPI: This is a 62 y/o male who presents for follow-up post hospitalization for hyperosmolar hyperglycemic state, acute on chronic kidney failure, hypokalemia and hyperkalemia.He was admitted 12/01/2017-12/04/2017 with DKA, acute on chronic kidney failure, hypokalemia and gout exacerbation. Patient reports feeling much better post-hospitalization. He states that he did not know that he was diabetic until this hospitalization. He was not on any antidiabetic medications prior to hospitalization. He was discharged on metformin, lantus and humalog. He denies any hypoglycemic episodes. His last potassium level was 2.8 prior to discharge. He is not on potassium supplements. He reports medication adherence and is trying to make some lifestyle changes.   His CXR in the hospital showed mild cardiomegaly and pulmonary vascular congestion without frank Edema and there was some concern about congestive heart failure but he did not get an echocardiogram. He denies lower extremity edema but reports dyspnea and orthopnea that have improved since hospitalizations He is still complaining of a "racing heart and missed heart beats". He denies chest pain.     Allergies  Allergen Reactions  . Lisinopril    Previous Medications   ALLOPURINOL (ZYLOPRIM) 100 MG TABLET    TAKE 1 TABLET BY MOUTH TWICE DAILY   AMITRIPTYLINE (ELAVIL) 25 MG TABLET    Take 1 tablet (25 mg total) by mouth at bedtime.   AMLODIPINE (NORVASC) 10 MG TABLET    TAKE 1 TABLET BY MOUTH ONCE DAILY   ASPIRIN EC 81 MG TABLET    Take 81 mg by mouth daily.   CLONIDINE (CATAPRES) 0.3 MG TABLET    Take 1 tablet (0.3 mg total) by mouth 2  (two) times daily.   GABAPENTIN (NEURONTIN) 100 MG CAPSULE    Take 1 capsule (100 mg total) by mouth 2 (two) times daily.   GABAPENTIN (NEURONTIN) 300 MG CAPSULE    Take 1 capsule (300 mg total) by mouth at bedtime.   HYDROCHLOROTHIAZIDE (HYDRODIURIL) 25 MG TABLET    TAKE 1 TABLET BY MOUTH ONCE DAILY   INSULIN DETEMIR (LEVEMIR) 100 UNIT/ML INJECTION    Inject 0.5 mLs (50 Units total) into the skin at bedtime.   INSULIN LISPRO (HUMALOG) 100 UNIT/ML INJECTION    Inject 0.15 mLs (15 Units total) into the skin 3 (three) times daily with meals.   METFORMIN (GLUCOPHAGE) 500 MG TABLET    Take 1 tablet (500 mg total) by mouth 2 (two) times daily with a meal.   METOPROLOL TARTRATE (LOPRESSOR) 100 MG TABLET    TAKE 1 TABLET BY MOUTH TWICE DAILY   NAPROXEN (NAPROSYN) 500 MG TABLET    Take 1 tablet (500 mg total) by mouth 2 (two) times daily with a meal.    Review of Systems  Constitutional: Positive for activity change (dyspnea with moderate exertion) and fatigue. Negative for fever.  HENT: Negative for postnasal drip and sore throat.   Eyes: Positive for visual disturbance (blurred vision).  Respiratory: Positive for shortness of breath. Negative for cough and wheezing.   Cardiovascular: Positive for palpitations. Negative for chest pain and leg swelling.  Gastrointestinal: Negative for constipation, diarrhea, nausea and vomiting.  Endocrine: Negative for polydipsia, polyphagia and polyuria.  Genitourinary: Positive  for frequency. Negative for urgency.  Musculoskeletal: Positive for gait problem (bilateral neuropathic foot pain).  Neurological: Negative for syncope, weakness, light-headedness and headaches.  Psychiatric/Behavioral: Negative.     Social History   Tobacco Use  . Smoking status: Never Smoker  . Smokeless tobacco: Never Used  Substance Use Topics  . Alcohol use: Yes   Objective:   BP 139/84   Pulse (!) 55   Temp (!) 97.5 F (36.4 C)   Ht 6' 1"  (1.854 m)   Wt (!) 306 lb 8 oz  (139 kg)   BMI 40.44 kg/m   Physical Exam  Constitutional: He is oriented to person, place, and time. He appears well-developed and well-nourished. No distress.  Obese  HENT:  Head: Normocephalic and atraumatic.  Nose: Nose normal.  Eyes: Pupils are equal, round, and reactive to light. Conjunctivae and EOM are normal.  Neck: Normal range of motion. Neck supple. No JVD present. No thyromegaly present.  Cardiovascular: Normal rate and intact distal pulses. Exam reveals no gallop.  No murmur heard. HR irregular at 60 beats per minutes  Pulmonary/Chest: Effort normal and breath sounds normal.  Abdominal: Soft. Bowel sounds are normal. He exhibits distension (PROFOUND CENTRAL OBESITY).  Musculoskeletal: Normal range of motion.  Neurological: He is alert and oriented to person, place, and time. He has normal reflexes.      Assessment & Plan:  1. Hypokalemia - Comp Met (CMET) IN 1 WEEK -Start potassium 40 mEq daily x 4 days and then 50mQ daily till seen at the clinic  2. Hypertension associated with diabetes (HCole -Start losartan 574mqhs -Decrease metoprolol to 5071mid -Monitor and keep blood pressure logs - Comp Met (CMET); Future - Lipid Profile; Future - Urine Microalbumin w/creat. ratio; Future - Magnesium; Future - Phosphorus; Future  3. Uncontrolled type 2 diabetes mellitus with hyperglycemia (HCC) -Continue metformin and insulin. -Monitor for signs and symptoms of hypoglycemia and treat as recommended. Reviewed detection and treatment of hypoglycemia with patient and significant other - Comp Met (CMET); Future - Lipid Profile; Future - Urine Microalbumin w/creat. ratio; Future - Magnesium; Future - Phosphorus; Future - PSA; Future  4. Type 2 diabetes mellitus with diabetic neuropathy, with long-term current use of insulin (HCCDolgevillesame as above -referral provided for diabetic eye exam and foot exam  5. Gouty arthritis of both ankles -Continue allopurinol 100 mg  daily  Uric acid level in 1 week  6. Hypertension, unspecified type Same as HTN with diabetes above  7. Atrial flutter, unspecified type (HCCBellevilleContinue metoprolol and monitor HR. -Go to the ED if HR<40 OR >140 - B Nat Peptide; Future - Ambulatory referral to Cardiology  8. Morbid obesity (HCCValierWeight loss strategies reviewed.  -Start daily exercise at 15 minutes/day and advance as tolerated  MagMary SellaP   [facility] [place]

## 2017-12-11 ENCOUNTER — Telehealth: Payer: Self-pay | Admitting: Licensed Clinical Social Worker

## 2017-12-11 NOTE — Telephone Encounter (Signed)
EMMI flagged patient for answering yes to loss of interest in things. Clinical Education officer, museum (CSW) was able to reach patient via telephone. Patient reported that he did not mean to hit yes to the question and he is fine. Patient reported that he is feeling overwhelmed with being a new insulin user. Patient reported that his wife is helping him and he has a good support system. Patient reported that he is not depressed and is not having thoughts of hurting himself. No future call is needed.   [employer], LCSW [phone]

## 2017-12-16 ENCOUNTER — Other Ambulatory Visit: Payer: Self-pay

## 2017-12-16 DIAGNOSIS — E1140 Type 2 diabetes mellitus with diabetic neuropathy, unspecified: Secondary | ICD-10-CM

## 2017-12-16 DIAGNOSIS — E876 Hypokalemia: Secondary | ICD-10-CM

## 2017-12-16 DIAGNOSIS — E1165 Type 2 diabetes mellitus with hyperglycemia: Secondary | ICD-10-CM

## 2017-12-16 DIAGNOSIS — E1159 Type 2 diabetes mellitus with other circulatory complications: Secondary | ICD-10-CM

## 2017-12-16 DIAGNOSIS — I4892 Unspecified atrial flutter: Secondary | ICD-10-CM

## 2017-12-16 DIAGNOSIS — Z794 Long term (current) use of insulin: Secondary | ICD-10-CM

## 2017-12-16 DIAGNOSIS — I10 Essential (primary) hypertension: Secondary | ICD-10-CM

## 2017-12-16 DIAGNOSIS — M109 Gout, unspecified: Secondary | ICD-10-CM

## 2017-12-17 ENCOUNTER — Ambulatory Visit: Payer: Self-pay | Admitting: Ophthalmology

## 2017-12-17 LAB — HM DIABETES EYE EXAM

## 2017-12-18 LAB — SPECIMEN STATUS

## 2017-12-19 LAB — COMPREHENSIVE METABOLIC PANEL
ALT: 21 IU/L (ref 0–44)
AST: 19 IU/L (ref 0–40)
Albumin/Globulin Ratio: 1.3 (ref 1.2–2.2)
Albumin: 3.9 g/dL (ref 3.6–4.8)
Alkaline Phosphatase: 69 IU/L (ref 39–117)
BILIRUBIN TOTAL: 0.5 mg/dL (ref 0.0–1.2)
BUN/Creatinine Ratio: 19 (ref 10–24)
BUN: 32 mg/dL — ABNORMAL HIGH (ref 8–27)
CHLORIDE: 100 mmol/L (ref 96–106)
CO2: 27 mmol/L (ref 20–29)
Calcium: 10.3 mg/dL — ABNORMAL HIGH (ref 8.6–10.2)
Creatinine, Ser: 1.71 mg/dL — ABNORMAL HIGH (ref 0.76–1.27)
GFR calc Af Amer: 48 mL/min/{1.73_m2} — ABNORMAL LOW (ref >59)
GFR, EST NON AFRICAN AMERICAN: 42 mL/min/{1.73_m2} — AB (ref >59)
GLOBULIN, TOTAL: 3.1 g/dL (ref 1.5–4.5)
Glucose: 85 mg/dL (ref 65–99)
POTASSIUM: 4.9 mmol/L (ref 3.5–5.2)
SODIUM: 145 mmol/L — AB (ref 134–144)
Total Protein: 7 g/dL (ref 6.0–8.5)

## 2017-12-19 LAB — SPECIMEN STATUS REPORT

## 2017-12-19 LAB — LIPID PANEL
CHOL/HDL RATIO: 7.9 ratio — AB (ref 0.0–5.0)
CHOLESTEROL TOTAL: 222 mg/dL — AB (ref 100–199)
HDL: 28 mg/dL — AB (ref >39)
LDL CALC: 147 mg/dL — AB (ref 0–99)
Triglycerides: 233 mg/dL — ABNORMAL HIGH (ref 0–149)
VLDL CHOLESTEROL CAL: 47 mg/dL — AB (ref 5–40)

## 2017-12-19 LAB — PSA: Prostate Specific Ag, Serum: 0.5 ng/mL (ref 0.0–4.0)

## 2017-12-19 LAB — BRAIN NATRIURETIC PEPTIDE

## 2017-12-19 LAB — MICROALBUMIN / CREATININE URINE RATIO
Creatinine, Urine: 131.8 mg/dL
MICROALBUM., U, RANDOM: 108.5 ug/mL
Microalb/Creat Ratio: 82.3 mg/g creat — ABNORMAL HIGH (ref 0.0–30.0)

## 2017-12-19 LAB — PHOSPHORUS: Phosphorus: 3.8 mg/dL (ref 2.5–4.5)

## 2017-12-19 LAB — MAGNESIUM: Magnesium: 1.9 mg/dL (ref 1.6–2.3)

## 2017-12-23 ENCOUNTER — Other Ambulatory Visit: Payer: Self-pay | Admitting: Ophthalmology

## 2017-12-24 ENCOUNTER — Ambulatory Visit: Payer: Self-pay | Admitting: Adult Health

## 2017-12-29 ENCOUNTER — Encounter: Payer: Self-pay | Admitting: Cardiovascular Disease

## 2017-12-29 ENCOUNTER — Ambulatory Visit: Payer: Self-pay | Admitting: Cardiovascular Disease

## 2017-12-29 ENCOUNTER — Ambulatory Visit: Payer: Self-pay | Admitting: Endocrinology

## 2017-12-29 VITALS — BP 115/69 | HR 54 | Temp 97.9°F | Wt 300.6 lb

## 2017-12-29 VITALS — BP 130/80 | HR 55 | Ht 73.0 in | Wt 298.0 lb

## 2017-12-29 DIAGNOSIS — R079 Chest pain, unspecified: Secondary | ICD-10-CM

## 2017-12-29 DIAGNOSIS — I10 Essential (primary) hypertension: Secondary | ICD-10-CM

## 2017-12-29 DIAGNOSIS — E119 Type 2 diabetes mellitus without complications: Secondary | ICD-10-CM

## 2017-12-29 DIAGNOSIS — R002 Palpitations: Secondary | ICD-10-CM

## 2017-12-29 DIAGNOSIS — R0602 Shortness of breath: Secondary | ICD-10-CM

## 2017-12-29 LAB — GLUCOSE, POCT (MANUAL RESULT ENTRY): POC Glucose: 81 mg/dl (ref 70–99)

## 2017-12-29 MED ORDER — GLUCOSE BLOOD VI STRP: 1.0000 | ORAL_STRIP | Freq: Three times a day (TID) | 4 refills | Status: DC

## 2017-12-29 MED ORDER — "INSULIN SYRINGE 31G X 5/16"" 1 ML MISC": 1.0000 | Freq: Every day | 3 refills | Status: DC

## 2017-12-29 MED ORDER — LANCETS MISC: 1.0000 | Freq: Three times a day (TID) | 4 refills | Status: DC

## 2017-12-29 MED ORDER — "INSULIN SYRINGE 30G X 5/16"" 0.3 ML MISC": 1.0000 | Freq: Three times a day (TID) | 12 refills | Status: DC

## 2017-12-29 MED ORDER — INSULIN LISPRO 100 UNIT/ML ~~LOC~~ SOLN: 15.0000 [IU] | Freq: Three times a day (TID) | SUBCUTANEOUS | 3 refills | Status: DC

## 2017-12-29 MED ORDER — INSULIN DETEMIR 100 UNIT/ML ~~LOC~~ SOLN: 50.0000 [IU] | Freq: Every day | SUBCUTANEOUS | 3 refills | Status: DC

## 2017-12-29 NOTE — Patient Instructions (Addendum)
Medication Instructions:  Your physician recommends that you continue on your current medications as directed. Please refer to the Current Medication list given to you today.   Labwork: none  Testing/Procedures: Your physician has requested that you have an echocardiogram. Echocardiography is a painless test that uses sound waves to create images of your heart. It provides your doctor with information about the size and shape of your heart and how well your heart's chambers and valves are working. This procedure takes approximately one hour. There are no restrictions for this procedure.    Your physician has requested that you have a lexiscan myoview. For further information please visit [email or web address]. Please follow instruction sheet, as given.  [place]  Your caregiver has ordered a Stress Test with nuclear imaging. The purpose of this test is to evaluate the blood supply to your heart muscle. This procedure is referred to as a "Non-Invasive Stress Test." This is because other than having an IV started in your vein, nothing is inserted or "invades" your body. Cardiac stress tests are done to find areas of poor blood flow to the heart by determining the extent of coronary artery disease (CAD). Some patients exercise on a treadmill, which naturally increases the blood flow to your heart, while others who are  unable to walk on a treadmill due to physical limitations have a pharmacologic/chemical stress agent called Lexiscan . This medicine will mimic walking on a treadmill by temporarily increasing your coronary blood flow.   Please note: these test may take anywhere between 2-4 hours to complete  PLEASE REPORT TO [facility] AT THE FIRST DESK WILL DIRECT YOU WHERE TO GO  Date of Procedure:_____________________________________  Arrival Time for Procedure:______________________________  Instructions regarding medication:   _xx___ : Hold diabetes  medication morning of procedure  _xx___:  Hold other medications as follows: Take 1/2 dose of evening insulin the night before test.   PLEASE NOTIFY THE OFFICE AT LEAST 24 HOURS IN ADVANCE IF YOU ARE UNABLE TO KEEP YOUR APPOINTMENT.  [phone] AND  PLEASE NOTIFY NUCLEAR MEDICINE AT [facility] AT LEAST 24 HOURS IN ADVANCE IF YOU ARE UNABLE TO KEEP YOUR APPOINTMENT. [phone]  How to prepare for your Myoview test:  1. Do not eat or drink after midnight 2. No caffeine for 24 hours prior to test 3. No smoking 24 hours prior to test. 4. Your medication may be taken with water.  If your doctor stopped a medication because of this test, do not take that medication. 5. Ladies, please do not wear dresses.  Skirts or pants are appropriate. Please wear a short sleeve shirt. 6. No perfume, cologne or lotion. 7. Wear comfortable walking shoes. No heels!            Follow-Up: As needed.   Any Other Special Instructions Will Be Listed Below (If Applicable).     If you need a refill on your cardiac medications before your next appointment, please call your pharmacy.  Cardiac Nuclear Scan A cardiac nuclear scan is a test that measures blood flow to the heart when a person is resting and when he or she is exercising. The test looks for problems such as:  Not enough blood reaching a portion of the heart.  The heart muscle not working normally.  You may need this test if:  You have heart disease.  You have had abnormal lab results.  You have had heart surgery or angioplasty.  You have chest pain.  You  have shortness of breath.  In this test, a radioactive dye (tracer) is injected into your bloodstream. After the tracer has traveled to your heart, an imaging device is used to measure how much of the tracer is absorbed by or distributed to various areas of your heart. This procedure is usually done at a hospital and takes 2-4 hours. Tell a health care provider about:  Any allergies  you have.  All medicines you are taking, including vitamins, herbs, eye drops, creams, and over-the-counter medicines.  Any problems you or family members have had with the use of anesthetic medicines.  Any blood disorders you have.  Any surgeries you have had.  Any medical conditions you have.  Whether you are pregnant or may be pregnant. What are the risks? Generally, this is a safe procedure. However, problems may occur, including:  Serious chest pain and heart attack. This is only a risk if the stress portion of the test is done.  Rapid heartbeat.  Sensation of warmth in your chest. This usually passes quickly.  What happens before the procedure?  Ask your health care provider about changing or stopping your regular medicines. This is especially important if you are taking diabetes medicines or blood thinners.  Remove your jewelry on the day of the procedure. What happens during the procedure?  An IV tube will be inserted into one of your veins.  Your health care provider will inject a small amount of radioactive tracer through the tube.  You will wait for 20-40 minutes while the tracer travels through your bloodstream.  Your heart activity will be monitored with an electrocardiogram (ECG).  You will lie down on an exam table.  Images of your heart will be taken for about 15-20 minutes.  You may be asked to exercise on a treadmill or stationary bike. While you exercise, your heart's activity will be monitored with an ECG, and your blood pressure will be checked. If you are unable to exercise, you may be given a medicine to increase blood flow to parts of your heart.  When blood flow to your heart has peaked, a tracer will again be injected through the IV tube.  After 20-40 minutes, you will get back on the exam table and have more images taken of your heart.  When the procedure is over, your IV tube will be removed. The procedure may vary among health care  providers and hospitals. Depending on the type of tracer used, scans may need to be repeated 3-4 hours later. What happens after the procedure?  Unless your health care provider tells you otherwise, you may return to your normal schedule, including diet, activities, and medicines.  Unless your health care provider tells you otherwise, you may increase your fluid intake. This will help flush the contrast dye from your body. Drink enough fluid to keep your urine clear or pale yellow.  It is up to you to get your test results. Ask your health care provider, or the department that is doing the test, when your results will be ready. Summary  A cardiac nuclear scan measures the blood flow to the heart when a person is resting and when he or she is exercising.  You may need this test if you are at risk for heart disease.  Tell your health care provider if you are pregnant.  Unless your health care provider tells you otherwise, increase your fluid intake. This will help flush the contrast dye from your body. Drink enough fluid to keep  your urine clear or pale yellow. This information is not intended to replace advice given to you by your health care provider. Make sure you discuss any questions you have with your health care provider. Document Released: 09/26/2004 Document Revised: 09/03/2016 Document Reviewed: 08/10/2013 Elsevier Interactive Patient Education  2017 [place]. Echocardiogram An echocardiogram, or echocardiography, uses sound waves (ultrasound) to produce an image of your heart. The echocardiogram is simple, painless, obtained within a short period of time, and offers valuable information to your health care provider. The images from an echocardiogram can provide information such as:  Evidence of coronary artery disease (CAD).  Heart size.  Heart muscle function.  Heart valve function.  Aneurysm detection.  Evidence of a past heart attack.  Fluid buildup around the  heart.  Heart muscle thickening.  Assess heart valve function.  Tell a health care provider about:  Any allergies you have.  All medicines you are taking, including vitamins, herbs, eye drops, creams, and over-the-counter medicines.  Any problems you or family members have had with anesthetic medicines.  Any blood disorders you have.  Any surgeries you have had.  Any medical conditions you have.  Whether you are pregnant or may be pregnant. What happens before the procedure? No special preparation is needed. Eat and drink normally. What happens during the procedure?  In order to produce an image of your heart, gel will be applied to your chest and a wand-like tool (transducer) will be moved over your chest. The gel will help transmit the sound waves from the transducer. The sound waves will harmlessly bounce off your heart to allow the heart images to be captured in real-time motion. These images will then be recorded.  You may need an IV to receive a medicine that improves the quality of the pictures. What happens after the procedure? You may return to your normal schedule including diet, activities, and medicines, unless your health care provider tells you otherwise. This information is not intended to replace advice given to you by your health care provider. Make sure you discuss any questions you have with your health care provider. Document Released: 08/29/2000 Document Revised: 04/19/2016 Document Reviewed: 05/09/2013 Elsevier Interactive Patient Education  2017 [employer].

## 2017-12-29 NOTE — Progress Notes (Signed)
Cardiology Office Note   Date:  12/29/2017   ID:  Steve Buck, DOB 1956/07/13, MRN [id number]  PCP:  System, Pcp Not In  Cardiologist:   Kathlyn Sacramento, MD   Chief Complaint  Patient presents with  . New Patient (Initial Visit)    Referred by [facility] for Af[facility]. Patient c/o chest pain, SOB, and swelling in ankles. Meds reviewed verbally with patient.       History of Present Illness: Steve Buck is a 62 y.o. male who [facility]ations.  He has multiple chronic medical conditions that include hypertension, morbid obesity and recently diagnosed diabetes mellitus.  He reports being told in the past about congestive heart failure with no recent workup.  He was seen by Dr. Nehemiah Massed in 2016 for evaluation of atypical chest pain and shortness of breath.  He was supposed to get an echocardiogram and a stress test but it appears that none of these tests were done. He was recently hospitalized at [facility] with hyperosmolar hyperglycemic state with acute kidney injury.  This was his first diagnosis with diabetes mellitus.  He was treated and subsequently improved.  Before his hospitalization, he was having frequent palpitations.  After he was treated, he felt significantly better with resolution of palpitations.  However, he complains of dyspnea with minimal activities and occasionally have left-sided chest discomfort described as aching sensation both at rest and with physical activities.  No significant leg edema.    Past Medical History:  Diagnosis Date  . Allergy    Seasonal  . CHF (congestive heart failure) ([place])   . DKA (diabetic ketoacidoses) ([place]) 12/01/2017  . Heart murmur   . Hypertension     Past Surgical History:  Procedure Laterality Date  . FOOT SURGERY  around 1994     Current Outpatient Medications  Medication Sig Dispense Refill  . allopurinol (ZYLOPRIM) 100 MG tablet Take 1 tablet (100 mg total) by mouth  daily. 90 tablet 3  . amitriptyline (ELAVIL) 25 MG tablet Take 1 tablet (25 mg total) by mouth at bedtime. 90 tablet 0  . amLODipine (NORVASC) 10 MG tablet TAKE 1 TABLET BY MOUTH ONCE DAILY 30 tablet 1  . aspirin EC 81 MG tablet Take 81 mg by mouth daily.    . cloNIDine (CATAPRES) 0.3 MG tablet Take 1 tablet (0.3 mg total) by mouth 2 (two) times daily. 60 tablet 3  . gabapentin (NEURONTIN) 100 MG capsule Take 1 capsule (100 mg total) by mouth 2 (two) times daily. 60 capsule 0  . gabapentin (NEURONTIN) 300 MG capsule Take 1 capsule (300 mg total) by mouth at bedtime. 30 capsule 0  . hydrochlorothiazide (HYDRODIURIL) 25 MG tablet TAKE 1 TABLET BY MOUTH ONCE DAILY 90 tablet 1  . insulin detemir (LEVEMIR) 100 UNIT/ML injection Inject 0.5 mLs (50 Units total) into the skin at bedtime. 300 mL 1  . insulin lispro (HUMALOG) 100 UNIT/ML injection Inject 0.15 mLs (15 Units total) into the skin 3 (three) times daily with meals. 300 mL 1  . losartan (COZAAR) 50 MG tablet Take 1 tablet (50 mg total) by mouth at bedtime. 30 tablet 3  . metFORMIN (GLUCOPHAGE) 500 MG tablet Take 1 tablet (500 mg total) by mouth 2 (two) times daily with a meal. 60 tablet 0  . metoprolol tartrate (LOPRESSOR) 100 MG tablet Take 0.5 tablets (50 mg total) by mouth 2 (two) times daily. 180 tablet 3  . potassium  chloride SA (K-DUR,KLOR-CON) 20 MEQ tablet Take 1 tablet (20 mEq total) by mouth 2 (two) times daily. 60 tablet 2   No current facility-administered medications for this visit.     Allergies:   Lisinopril    Social History:  The patient  reports that he has never smoked. He has never used smokeless tobacco. He reports that he drinks alcohol. He reports that he has current or past drug history. Drug: Marijuana.   Family History:  The patient's family history includes Arthritis in his father; CAD in his father; Cancer in his mother.    ROS:  Please see the history of present illness.   Otherwise, review of systems are  positive for none.   All other systems are reviewed and negative.    PHYSICAL EXAM: VS:  BP 130/80 (BP Location: Right Arm, Patient Position: Sitting, Cuff Size: Large)   Pulse (!) 55   Ht 6\' 1"  (1.854 m)   Wt 298 lb (135.2 kg)   BMI 39.32 kg/m  , BMI Body mass index is 39.32 kg/m. GEN: Well nourished, well developed, in no acute distress  HEENT: normal  Neck: no JVD, carotid bruits, or masses Cardiac: RRR; no murmurs, rubs, or gallops,no edema  Respiratory:  clear to auscultation bilaterally, normal work of breathing GI: soft, nontender, nondistended, + BS MS: no deformity or atrophy  Skin: warm and dry, no rash Neuro:  Strength and sensation are intact Psych: euthymic mood, full affect   EKG:  EKG is ordered today. The ekg ordered today demonstrates : Sinus bradycardia with first-degree AV block.  Nonspecific T wave changes.   Recent Labs: 01/20/2017: TSH 3.020 12/16/2017: ALT 21; BNP CANCELED; BUN 32; Creatinine, Ser 1.71; Hemoglobin WILL FOLLOW; Magnesium 1.9; Platelets WILL FOLLOW; Potassium 4.9; Sodium 145    Lipid Panel    Component Value Date/Time   CHOL 222 (H) 12/16/2017 1037   TRIG 233 (H) 12/16/2017 1037   HDL 28 (L) 12/16/2017 1037   CHOLHDL 7.9 (H) 12/16/2017 1037   LDLCALC 147 (H) 12/16/2017 1037      Wt Readings from Last 3 Encounters:  12/29/17 298 lb (135.2 kg)  12/10/17 (!) 306 lb 8 oz (139 kg)  12/01/17 300 lb (136.1 kg)       PAD Screen 12/29/2017  Previous PAD dx? No  Previous surgical procedure? No  Pain with walking? Yes  Subsides with rest? No  Feet/toe relief with dangling? Yes  Painful, non-healing ulcers? No  Extremities discolored? No      ASSESSMENT AND PLAN:  1.  Palpitations: I suspect that he had some kind of arrhythmia in the setting of electrolyte abnormalities related to hyperosmolar hyperglycemic state.  Interestingly, since he was treated, he reports resolution of palpitations.  Thus, there is little utility for  monitoring at this time.  If he develops recurrent palpitations, the plan is to obtain at least a 48-hour Holter monitor.  2.  Exertional dyspnea and atypical chest pain: He has multiple risk factors for coronary artery disease including recently diagnosed diabetes mellitus.  He also reports prior history of congestive heart failure.  Due to that, I recommend evaluation with an echocardiogram and a Lexiscan Myoview.  He is not able to exercise on a treadmill.  3.  Essential hypertension: Blood pressure is reasonably controlled on current medications.    Disposition:   FU with me prn.  Signed,  Kathlyn Sacramento, MD  12/29/2017 2:15 PM     [facility]

## 2017-12-29 NOTE — Progress Notes (Signed)
Steve Buck is a 62 y.o. male presenting with 3-week diagnosis of T2DM after a 12/02/17 visit to hospital with HHS ([place] blood sugar was 833).   Blood sugar: checking regularly, mostly 80-120 throughout the day. Currently checking blood sugar 6x daily (before meals and 2 hour after meals). Taking medication strictly, very nervous about ever getting HHS again.  Meds: 50u levemir at night, 15u Humalog with meals, 500 metformin BID.  Diet: low carbs, limited sugar. Breakfast (eggs, bacon, grits, oatmeal), Lunch (salad with chicken or eggs), Dinner (chicken, veggies)  Exercise: no exercise for last 7-8 years, trying to walk again.  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  allopurinol (ZYLOPRIM) 100 MG tablet, Take 1 tablet (100 mg total) by mouth daily., Disp: 90 tablet, Rfl: 3 .  amitriptyline (ELAVIL) 25 MG tablet, Take 1 tablet (25 mg total) by mouth at bedtime., Disp: 90 tablet, Rfl: 0 .  amLODipine (NORVASC) 10 MG tablet, TAKE 1 TABLET BY MOUTH ONCE DAILY, Disp: 30 tablet, Rfl: 1 .  aspirin EC 81 MG tablet, Take 81 mg by mouth daily., Disp: , Rfl:  .  cloNIDine (CATAPRES) 0.3 MG tablet, Take 1 tablet (0.3 mg total) by mouth 2 (two) times daily., Disp: 60 tablet, Rfl: 3 .  gabapentin (NEURONTIN) 100 MG capsule, Take 1 capsule (100 mg total) by mouth 2 (two) times daily., Disp: 60 capsule, Rfl: 0 .  gabapentin (NEURONTIN) 300 MG capsule, Take 1 capsule (300 mg total) by mouth at bedtime., Disp: 30 capsule, Rfl: 0 .  hydrochlorothiazide (HYDRODIURIL) 25 MG tablet, TAKE 1 TABLET BY MOUTH ONCE DAILY, Disp: 90 tablet, Rfl: 1 .  insulin detemir (LEVEMIR) 100 UNIT/ML injection, Inject 0.5 mLs (50 Units total) into the skin at bedtime., Disp: 300 mL, Rfl: 1 .  insulin lispro (HUMALOG) 100 UNIT/ML injection, Inject 0.15 mLs (15 Units total) into the skin 3 (three) times daily with meals., Disp: 300 mL, Rfl: 1 .   losartan (COZAAR) 50 MG tablet, Take 1 tablet (50 mg total) by mouth at bedtime., Disp: 30 tablet, Rfl: 3 .  metFORMIN (GLUCOPHAGE) 500 MG tablet, Take 1 tablet (500 mg total) by mouth 2 (two) times daily with a meal., Disp: 60 tablet, Rfl: 0 .  metoprolol tartrate (LOPRESSOR) 100 MG tablet, Take 0.5 tablets (50 mg total) by mouth 2 (two) times daily., Disp: 180 tablet, Rfl: 3 .  potassium chloride SA (K-DUR,KLOR-CON) 20 MEQ tablet, Take 1 tablet (20 mEq total) by mouth 2 (two) times daily., Disp: 60 tablet, Rfl: 2   Vitals:   12/29/17 1851  BP: 115/69  Pulse: (!) 54  Temp: 97.9 F (36.6 C)   Body mass index is 39.66 kg/m.  Physical Exam: Patient comfortable appearing CV: RRR, no MRG Pulm: normal respiratory effort, CTAB  Plan: 1. Continue on current medication plan, check A1c in 2 months at next visit.  -Pt counseled to check blood sugar 3x daily before meals, stop checking BS after meals. -Counseled to drop to 10u of Humalog before meals (instead of 15u) -Continue with 50u Levemir -If blood sugars are adequate, can drop to 5u of Humalog. Will be in touch with Steve Buck to titrate.  2. Patient is leaving for trip to [place] in 1 month, will be out of state for 1 month and wants to have enough medications and needles for the whole time. Will visit PCP in 2 days and get  enough medications and needles for the trip.

## 2017-12-29 NOTE — Patient Instructions (Signed)
Adjust insulin as directed with help from [employer] and Prudencio Burly by text.

## 2017-12-31 ENCOUNTER — Ambulatory Visit: Payer: Self-pay

## 2017-12-31 ENCOUNTER — Encounter: Payer: Self-pay | Admitting: Adult Health

## 2017-12-31 ENCOUNTER — Ambulatory Visit (INDEPENDENT_AMBULATORY_CARE_PROVIDER_SITE_OTHER): Payer: Self-pay

## 2017-12-31 ENCOUNTER — Other Ambulatory Visit: Payer: Self-pay | Admitting: Adult Health

## 2017-12-31 ENCOUNTER — Ambulatory Visit: Payer: Self-pay | Admitting: Adult Health

## 2017-12-31 VITALS — BP 124/74 | HR 54 | Temp 96.7°F | Ht 73.0 in | Wt 297.9 lb

## 2017-12-31 DIAGNOSIS — I10 Essential (primary) hypertension: Secondary | ICD-10-CM

## 2017-12-31 DIAGNOSIS — E119 Type 2 diabetes mellitus without complications: Secondary | ICD-10-CM

## 2017-12-31 DIAGNOSIS — E785 Hyperlipidemia, unspecified: Secondary | ICD-10-CM

## 2017-12-31 DIAGNOSIS — R0602 Shortness of breath: Secondary | ICD-10-CM

## 2017-12-31 DIAGNOSIS — E1140 Type 2 diabetes mellitus with diabetic neuropathy, unspecified: Secondary | ICD-10-CM | POA: Insufficient documentation

## 2017-12-31 DIAGNOSIS — E1169 Type 2 diabetes mellitus with other specified complication: Secondary | ICD-10-CM

## 2017-12-31 DIAGNOSIS — E1122 Type 2 diabetes mellitus with diabetic chronic kidney disease: Secondary | ICD-10-CM

## 2017-12-31 DIAGNOSIS — R079 Chest pain, unspecified: Secondary | ICD-10-CM

## 2017-12-31 DIAGNOSIS — M109 Gout, unspecified: Secondary | ICD-10-CM

## 2017-12-31 DIAGNOSIS — N182 Chronic kidney disease, stage 2 (mild): Secondary | ICD-10-CM

## 2017-12-31 DIAGNOSIS — E1021 Type 1 diabetes mellitus with diabetic nephropathy: Secondary | ICD-10-CM

## 2017-12-31 DIAGNOSIS — E1165 Type 2 diabetes mellitus with hyperglycemia: Secondary | ICD-10-CM

## 2017-12-31 DIAGNOSIS — I509 Heart failure, unspecified: Secondary | ICD-10-CM

## 2017-12-31 DIAGNOSIS — Z794 Long term (current) use of insulin: Secondary | ICD-10-CM

## 2017-12-31 DIAGNOSIS — IMO0002 Reserved for concepts with insufficient information to code with codable children: Secondary | ICD-10-CM

## 2017-12-31 LAB — ECHOCARDIOGRAM COMPLETE
HEIGHTINCHES: 73 in
Weight: 4766.4 oz

## 2017-12-31 MED ORDER — CLONIDINE HCL 0.3 MG PO TABS: 0.3000 mg | ORAL_TABLET | Freq: Two times a day (BID) | ORAL | 3 refills | Status: DC

## 2017-12-31 MED ORDER — GABAPENTIN 300 MG PO CAPS: 300.0000 mg | ORAL_CAPSULE | Freq: Every day | ORAL | 0 refills | Status: DC

## 2017-12-31 MED ORDER — LOSARTAN POTASSIUM 50 MG PO TABS: 50.0000 mg | ORAL_TABLET | Freq: Every day | ORAL | 3 refills | Status: DC

## 2017-12-31 MED ORDER — METFORMIN HCL 500 MG PO TABS: 500.0000 mg | ORAL_TABLET | Freq: Two times a day (BID) | ORAL | 0 refills | Status: DC

## 2017-12-31 MED ORDER — INSULIN LISPRO 100 UNIT/ML ~~LOC~~ SOLN: 10.0000 [IU] | Freq: Three times a day (TID) | SUBCUTANEOUS | 3 refills | Status: DC

## 2017-12-31 MED ORDER — METOPROLOL TARTRATE 100 MG PO TABS: 50.0000 mg | ORAL_TABLET | Freq: Two times a day (BID) | ORAL | 3 refills | Status: DC

## 2017-12-31 MED ORDER — GLUCOSE BLOOD VI STRP: 1.0000 | ORAL_STRIP | Freq: Three times a day (TID) | 4 refills | Status: DC

## 2017-12-31 MED ORDER — LANCETS MISC: 1.0000 | Freq: Three times a day (TID) | 4 refills | Status: DC

## 2017-12-31 MED ORDER — "INSULIN SYRINGE 31G X 5/16"" 1 ML MISC": 1.0000 | Freq: Every day | 3 refills | Status: DC

## 2017-12-31 MED ORDER — INSULIN DETEMIR 100 UNIT/ML ~~LOC~~ SOLN: 50.0000 [IU] | Freq: Every day | SUBCUTANEOUS | 3 refills | Status: DC

## 2017-12-31 MED ORDER — ALLOPURINOL 100 MG PO TABS: 100.0000 mg | ORAL_TABLET | Freq: Every day | ORAL | 3 refills | Status: DC

## 2017-12-31 MED ORDER — "INSULIN SYRINGE 30G X 5/16"" 0.3 ML MISC": 1.0000 | Freq: Three times a day (TID) | 12 refills | Status: DC

## 2017-12-31 MED ORDER — HYDROCHLOROTHIAZIDE 25 MG PO TABS: 25.0000 mg | ORAL_TABLET | Freq: Every day | ORAL | 1 refills | Status: DC

## 2017-12-31 MED ORDER — ASPIRIN EC 81 MG PO TBEC: 81.0000 mg | DELAYED_RELEASE_TABLET | Freq: Every day | ORAL | 3 refills | Status: DC

## 2017-12-31 MED ORDER — GABAPENTIN 100 MG PO CAPS: 100.0000 mg | ORAL_CAPSULE | Freq: Two times a day (BID) | ORAL | 0 refills | Status: DC

## 2017-12-31 MED ORDER — ATORVASTATIN CALCIUM 10 MG PO TABS: 10.0000 mg | ORAL_TABLET | Freq: Every day | ORAL | 3 refills | Status: DC

## 2017-12-31 NOTE — Progress Notes (Signed)
Patient: Steve Buck Male    DOB: 07-09-1956   62 y.o.   MRN: [id number] Visit Date: 12/31/2017  Today's Provider: Mary Sella, NP   Chief Complaint  Patient presents with  . Follow-up   Subjective:    HPI Patient is seen for routine follow-up for diabetes and hypertension.  He reports doing very well.  He monitors his blood glucose levels judiciously.  All blood glucose levels fasting and postprandial have been less than 198 mg/dL.  He only had one episode of mild hypoglycemia where his blood sugar was 70 mg/dL and he treated it appropriately.  His blood pressure has been well controlled with all readings less than 140/90.  Overall, patient reports feeling better.  He denies any medication side effects.  He is compliant with medications and has made significant dietary changes.  His weight continues to trend down.  Allergies  Allergen Reactions  . Lisinopril    Previous Medications   ALLOPURINOL (ZYLOPRIM) 100 MG TABLET    Take 1 tablet (100 mg total) by mouth daily.   AMITRIPTYLINE (ELAVIL) 25 MG TABLET    Take 1 tablet (25 mg total) by mouth at bedtime.   AMLODIPINE (NORVASC) 10 MG TABLET    TAKE 1 TABLET BY MOUTH ONCE DAILY   ASPIRIN EC 81 MG TABLET    Take 81 mg by mouth daily.   CLONIDINE (CATAPRES) 0.3 MG TABLET    Take 1 tablet (0.3 mg total) by mouth 2 (two) times daily.   GABAPENTIN (NEURONTIN) 100 MG CAPSULE    Take 1 capsule (100 mg total) by mouth 2 (two) times daily.   GABAPENTIN (NEURONTIN) 300 MG CAPSULE    Take 1 capsule (300 mg total) by mouth at bedtime.   GLUCOSE BLOOD TEST STRIP    1 each by Other route 3 (three) times daily. Use as instructed   HYDROCHLOROTHIAZIDE (HYDRODIURIL) 25 MG TABLET    TAKE 1 TABLET BY MOUTH ONCE DAILY   INSULIN DETEMIR (LEVEMIR) 100 UNIT/ML INJECTION    Inject 0.5-1 mLs (50-100 Units total) into the skin at bedtime.   INSULIN LISPRO (HUMALOG) 100 UNIT/ML INJECTION    Inject 0.15 mLs (15 Units total) into the skin 3 (three) times  daily with meals.   INSULIN SYRINGE-NEEDLE U-100 (INSULIN SYRINGE .3CC/30GX5/16") 30G X 5/16" 0.3 ML MISC    1 each by Does not apply route 3 (three) times daily. For Humalog   INSULIN SYRINGE-NEEDLE U-100 (INSULIN SYRINGE 1CC/31GX5/16") 31G X 5/16" 1 ML MISC    1 each by Does not apply route daily. For Lantus insulin   LANCETS MISC    1 each by Does not apply route 3 (three) times daily.   LOSARTAN (COZAAR) 50 MG TABLET    Take 1 tablet (50 mg total) by mouth at bedtime.   METFORMIN (GLUCOPHAGE) 500 MG TABLET    Take 1 tablet (500 mg total) by mouth 2 (two) times daily with a meal.   METOPROLOL TARTRATE (LOPRESSOR) 100 MG TABLET    Take 0.5 tablets (50 mg total) by mouth 2 (two) times daily.   POTASSIUM CHLORIDE SA (K-DUR,KLOR-CON) 20 MEQ TABLET    Take 1 tablet (20 mEq total) by mouth 2 (two) times daily.    Review of Systems  Constitutional: Negative for appetite change.  HENT: Negative.   Eyes: Positive for visual disturbance (blurred vision-using reading glasses).  Respiratory: Negative for cough and shortness of breath.   Gastrointestinal: Negative.   Endocrine: Negative for polydipsia  and polyuria.  Genitourinary: Negative for dysuria.  Musculoskeletal: Negative.   Skin: Negative.   Neurological: Negative for dizziness, weakness, numbness and headaches.  Psychiatric/Behavioral: Negative.     Social History   Tobacco Use  . Smoking status: Never Smoker  . Smokeless tobacco: Never Used  Substance Use Topics  . Alcohol use: Yes   Objective:   BP 124/74   Pulse (!) 54   Temp (!) 96.7 F (35.9 C)   Ht 6\' 1"  (1.854 m)   Wt 297 lb 14.4 oz (135.1 kg)   BMI 39.30 kg/m   Physical Exam  Constitutional: He is oriented to person, place, and time. He appears well-developed and well-nourished.  Eyes: Pupils are equal, round, and reactive to light. Conjunctivae and EOM are normal.  Neck: Normal range of motion. Neck supple. No JVD present.  Cardiovascular: Normal rate, regular  rhythm, normal heart sounds and intact distal pulses.  Pulmonary/Chest: Effort normal and breath sounds normal.  Abdominal: Soft. Bowel sounds are normal.  Musculoskeletal: Normal range of motion.  Diabetic foot exam: Mild loss of light touch sensation in BLLE consistent with mild diabetic neuropathy  Neurological: He is alert and oriented to person, place, and time.  Skin: Skin is warm and dry. Capillary refill takes 2 to 3 seconds.  Psychiatric: He has a normal mood and affect.      Assessment & Plan:  1. Hypertension associated with type 2 diabetes mellitus Continue current medications - cloNIDine (CATAPRES) 0.3 MG tablet; Take 1 tablet (0.3 mg total) by mouth 2 (two) times daily.  Dispense: 180 tablet; Refill: 3 - metoprolol tartrate (LOPRESSOR) 100 MG tablet; Take 0.5 tablets (50 mg total) by mouth 2 (two) times daily.  Dispense: 180 tablet; Refill: 3  2. Gout, unspecified cause, unspecified chronicity, unspecified site Pain is resolved.  Continue allopurinol for prophylaxis  3. Congestive heart failure, unspecified HF chronicity, unspecified heart failure type [facility]) Patient is euvolemic.  Continue hydrochlorothiazide daily.  Low-salt diet recommended.  2D echo pending  4. Type 2 diabetes mellitus, uncontrolled, with neuropathy (HCC) Lower extremity pain is improved, continue gabapentin and amitriptyline  5. Type 2 diabetes mellitus treated with insulin (HCC) Currently on insulin and following up with endocrinology.  Monitor for signs and symptoms of hypoglycemia and treat accordingly  6. Hyperlipidemia associated with type 2 diabetes mellitus (HCC) Last lipid panel showed an elevated LDL.  Patient is at increased risk for cardiac events hence we will start him on a low-dose of statin  7. Controlled type 2 diabetes mellitus with stage 2 chronic kidney disease, with long-term current use of insulin (HCC) Creatinine is 1.7 and his GFR is 42.  Will avoid nephrotoxic medications and  continue to monitor.  Return to the clinic in 1 month.  Mary Sella, NP   [facility]

## 2018-01-05 ENCOUNTER — Other Ambulatory Visit: Payer: Self-pay

## 2018-01-05 DIAGNOSIS — I10 Essential (primary) hypertension: Secondary | ICD-10-CM

## 2018-01-05 MED ORDER — "INSULIN SYRINGE 30G X 5/16"" 0.3 ML MISC": 1.0000 | Freq: Three times a day (TID) | 12 refills | Status: DC

## 2018-01-05 MED ORDER — INSULIN LISPRO 100 UNIT/ML ~~LOC~~ SOLN: 10.0000 [IU] | Freq: Three times a day (TID) | SUBCUTANEOUS | 3 refills | Status: DC

## 2018-01-05 MED ORDER — GABAPENTIN 100 MG PO CAPS: 100.0000 mg | ORAL_CAPSULE | Freq: Two times a day (BID) | ORAL | 0 refills | Status: DC

## 2018-01-05 MED ORDER — INSULIN DETEMIR 100 UNIT/ML ~~LOC~~ SOLN: 50.0000 [IU] | Freq: Every day | SUBCUTANEOUS | 3 refills | Status: DC

## 2018-01-05 MED ORDER — HYDROCHLOROTHIAZIDE 25 MG PO TABS: 25.0000 mg | ORAL_TABLET | Freq: Every day | ORAL | 1 refills | Status: DC

## 2018-01-05 MED ORDER — "INSULIN SYRINGE 31G X 5/16"" 1 ML MISC": 1.0000 | Freq: Every day | 3 refills | Status: DC

## 2018-01-05 MED ORDER — METFORMIN HCL 500 MG PO TABS: 500.0000 mg | ORAL_TABLET | Freq: Two times a day (BID) | ORAL | 0 refills | Status: DC

## 2018-01-05 MED ORDER — GLUCOSE BLOOD VI STRP: 1.0000 | ORAL_STRIP | Freq: Three times a day (TID) | 4 refills | Status: DC

## 2018-01-05 MED ORDER — ALLOPURINOL 100 MG PO TABS: 100.0000 mg | ORAL_TABLET | Freq: Every day | ORAL | 3 refills | Status: DC

## 2018-01-05 MED ORDER — ATORVASTATIN CALCIUM 10 MG PO TABS: 10.0000 mg | ORAL_TABLET | Freq: Every day | ORAL | 3 refills | Status: DC

## 2018-01-05 MED ORDER — GABAPENTIN 300 MG PO CAPS: 300.0000 mg | ORAL_CAPSULE | Freq: Every day | ORAL | 0 refills | Status: DC

## 2018-01-05 MED ORDER — LANCETS MISC: 1.0000 | Freq: Three times a day (TID) | 4 refills | Status: DC

## 2018-01-05 MED ORDER — METOPROLOL TARTRATE 100 MG PO TABS: 50.0000 mg | ORAL_TABLET | Freq: Two times a day (BID) | ORAL | 3 refills | Status: DC

## 2018-01-05 MED ORDER — GABAPENTIN 300 MG PO CAPS
300.0000 mg | ORAL_CAPSULE | Freq: Every day | ORAL | 0 refills | Status: DC
Start: 2018-01-05 — End: 2018-01-05

## 2018-01-05 MED ORDER — LOSARTAN POTASSIUM 50 MG PO TABS: 50.0000 mg | ORAL_TABLET | Freq: Every day | ORAL | 3 refills | Status: DC

## 2018-01-05 MED ORDER — ASPIRIN EC 81 MG PO TBEC: 81.0000 mg | DELAYED_RELEASE_TABLET | Freq: Every day | ORAL | 3 refills | Status: DC

## 2018-01-05 MED ORDER — CLONIDINE HCL 0.3 MG PO TABS: 0.3000 mg | ORAL_TABLET | Freq: Two times a day (BID) | ORAL | 3 refills | Status: DC

## 2018-01-05 NOTE — Progress Notes (Signed)
Pt requested a 9 day supply on all rx's. They were sent in to [facility]. Pt requested meds be sent to medication management instead.

## 2018-01-06 ENCOUNTER — Ambulatory Visit
Admission: RE | Admit: 2018-01-06 | Discharge: 2018-01-06 | Disposition: A | Discharge status: Home / Self Care | Payer: Self-pay | Source: Ambulatory Visit | Attending: Cardiovascular Disease | Admitting: Cardiovascular Disease

## 2018-01-06 DIAGNOSIS — R079 Chest pain, unspecified: Secondary | ICD-10-CM | POA: Insufficient documentation

## 2018-01-06 DIAGNOSIS — R0602 Shortness of breath: Secondary | ICD-10-CM | POA: Insufficient documentation

## 2018-01-06 LAB — NM MYOCAR MULTI W/SPECT W/WALL MOTION / EF
CHL CUP NUCLEAR SSS: 1
CSEPHR: 41 %
LV dias vol: 124 mL (ref 62–150)
LV sys vol: 63 mL
Peak HR: 65 {beats}/min
Rest HR: 54 {beats}/min
SDS: 1
SRS: 5
TID: 1.03

## 2018-01-06 MED ORDER — TECHNETIUM TC 99M TETROFOSMIN IV KIT
29.9570 | PACK | Freq: Once | INTRAVENOUS | Status: AC | PRN
  Administered 2018-01-06: 29.957 via INTRAVENOUS

## 2018-01-06 MED ORDER — TECHNETIUM TC 99M TETROFOSMIN IV KIT
13.7600 | PACK | Freq: Once | INTRAVENOUS | Status: AC | PRN
  Administered 2018-01-06: 13.76 via INTRAVENOUS

## 2018-01-06 MED ORDER — REGADENOSON 0.4 MG/5ML IV SOLN
0.4000 mg | Freq: Once | INTRAVENOUS | Status: AC
  Administered 2018-01-06: 0.4 mg via INTRAVENOUS

## 2018-01-08 ENCOUNTER — Other Ambulatory Visit (HOSPITAL_COMMUNITY): Payer: Self-pay

## 2018-01-11 ENCOUNTER — Ambulatory Visit: Payer: Self-pay | Admitting: Pharmacy Technician

## 2018-01-11 DIAGNOSIS — Z79899 Other long term (current) drug therapy: Secondary | ICD-10-CM

## 2018-01-11 NOTE — Progress Notes (Signed)
Completed [facility] application and contract.  Patient agreed to all terms of the [facility] contract.    Patient approved to receive medication assistance through 2019 at [facility], as long as eligibility criteria continues to be met.    Provided patient with Civil engineer, contracting based on his particular needs.    Levemir Prescription Application completed with patient.  Forwarded to [facility] for signature.  Upon receipt of signed application from provider, Levemir Prescription Application will be submitted to [employer].  [place]

## 2018-01-13 ENCOUNTER — Other Ambulatory Visit: Payer: Self-pay

## 2018-01-13 ENCOUNTER — Encounter: Payer: Self-pay | Admitting: Pharmacist

## 2018-01-13 ENCOUNTER — Ambulatory Visit: Payer: Self-pay | Admitting: Pharmacist

## 2018-01-13 VITALS — BP 118/76 | HR 50 | Ht 73.0 in | Wt 294.0 lb

## 2018-01-13 DIAGNOSIS — Z79899 Other long term (current) drug therapy: Secondary | ICD-10-CM

## 2018-01-13 NOTE — Progress Notes (Signed)
[facility] Visit Note  Patient: Steve Buck. MRN: [id number] Date of Birth: 08-16-1956 PCP: Staci Acosta, NP   [name] 62 y.o. male presents for an initial medication review with the pharmacist today.  BP 118/76 (BP Location: Left Arm, Patient Position: Sitting, Cuff Size: Large)   Pulse (!) 50   Ht 6\' 1"  (1.854 m)   Wt 294 lb (133.4 kg)   BMI 38.79 kg/m   Patient Information   Past Medical History:  Diagnosis Date  . Allergy    Seasonal  . CHF (congestive heart failure) ([place])   . DKA (diabetic ketoacidoses) ([place]) 12/01/2017  . Heart murmur   . Hypertension   . Type 2 diabetes mellitus treated with insulin ([place])   . Type 2 diabetes mellitus, uncontrolled, with neuropathy ([place])       Past Surgical History:  Procedure Laterality Date  . FOOT SURGERY  around 60     Family History  Problem Relation Age of Onset  . Cancer Mother        Skin  . CAD Father   . Arthritis Father     New Diagnoses (since last visit):   Family Support: Good  Lifestyle Diet: Breakfast: eggs, bacon, grits or toast  Lunch: chicken breast or salad, or veggies Dinner: varies Drinks: coffee, water, Crystal Lite    Current Exercise Habits: The patient does not participate in regular exercise at present       Social History   Substance and Sexual Activity  Alcohol Use Not Currently      Social History   Tobacco Use  Smoking Status Never Smoker  Smokeless Tobacco Never Used      Health Maintenance  Topic Date Due  . Hepatitis C Screening  [birth year]  . PNEUMOCOCCAL POLYSACCHARIDE VACCINE (1) 04/26/1956  . FOOT EXAM  04/26/1964  . HIV Screening  04/26/1969  . TETANUS/TDAP  04/26/1973  . COLONOSCOPY  04/26/2004  . INFLUENZA VACCINE  04/15/2018  . HEMOGLOBIN A1C  06/04/2018  . OPHTHALMOLOGY EXAM  12/18/2018   Outpatient Encounter Medications as of 01/13/2018  Medication Sig  . allopurinol (ZYLOPRIM) 100 MG tablet Take 1  tablet (100 mg total) by mouth daily.  Marland Kitchen amitriptyline (ELAVIL) 25 MG tablet Take 1 tablet (25 mg total) by mouth at bedtime.  Marland Kitchen aspirin EC 81 MG tablet Take 1 tablet (81 mg total) by mouth daily.  Marland Kitchen atorvastatin (LIPITOR) 10 MG tablet Take 1 tablet (10 mg total) by mouth daily.  . cloNIDine (CATAPRES) 0.3 MG tablet Take 1 tablet (0.3 mg total) by mouth 2 (two) times daily.  Marland Kitchen gabapentin (NEURONTIN) 100 MG capsule Take 1 capsule (100 mg total) by mouth 2 (two) times daily.  Marland Kitchen gabapentin (NEURONTIN) 300 MG capsule Take 1 capsule (300 mg total) by mouth at bedtime.  Marland Kitchen glucose blood test strip 1 each by Other route 3 (three) times daily. Use as instructed  . hydrochlorothiazide (HYDRODIURIL) 25 MG tablet Take 1 tablet (25 mg total) by mouth daily.  . insulin detemir (LEVEMIR) 100 UNIT/ML injection Inject 0.5 mLs (50 Units total) into the skin at bedtime. (Patient taking differently: Inject 40 Units into the skin at bedtime. )  . Insulin Syringe-Needle U-100 (INSULIN SYRINGE 1CC/31GX5/16") 31G X 5/16" 1 ML MISC 1 each by Does not apply route daily. For Lantus insulin  . Lancets MISC 1 each by Does not apply route 3 (three) times daily.  Marland Kitchen losartan (COZAAR) 50 MG tablet Take 1 tablet (  50 mg total) by mouth at bedtime.  . metFORMIN (GLUCOPHAGE) 500 MG tablet Take 1 tablet (500 mg total) by mouth 2 (two) times daily with a meal.  . metoprolol tartrate (LOPRESSOR) 100 MG tablet Take 0.5 tablets (50 mg total) by mouth 2 (two) times daily.  . potassium chloride SA (K-DUR,KLOR-CON) 20 MEQ tablet Take 20 mEq by mouth daily.  . [DISCONTINUED] insulin lispro (HUMALOG) 100 UNIT/ML injection Inject 0.1 mLs (10 Units total) into the skin 3 (three) times daily with meals.  . [DISCONTINUED] Insulin Syringe-Needle U-100 (INSULIN SYRINGE .3CC/30GX5/16") 30G X 5/16" 0.3 ML MISC 1 each by Does not apply route 3 (three) times daily. For Humalog   No facility-administered encounter medications on file as of 01/13/2018.     Health Maintenance/Date Completed  Last ED visit: 11/2017  Last Visit to PCP: 12/2017 Next Visit to PCP: 01/21/18 Specialist Visit: July 2019 (Endocrine) Dental Exam: none recent  Eye Exam: TBD Prostate Exam: ? Pelvic/PAP Exam: n/a Mammogram: n/a DEXA: n/a Colonoscopy: 5+ years ago Flu Vaccine: none Pneumonia Vaccine: none  ASSESSMENT:  Compliance:  Patient is compliant with his medications. He brought a list from a previous visit. He is able to convey what each medication is for and how to take the medication.  DM Type 2:  -Diagnosed last month during hospital admission. ( Acute hyperosmolar hyperglycemic state with AKI/CKD)  BG = 763 mg/dl upon admission. -Patient reports average FBS = 90-95 mg/dl and today's reading was 89 mg/dl. -Lows: while on Humalog, 2-3 episodes; 60s and low 70s. Patient has low blood sugar awareness and feels shaky or begins to sweat. Discussed potential masking of symptoms with beta blocker. -Stopped Humalog about 1 week ago per advise from provider. He continues to check his blood sugars 3 times per day. Levemir dose is down to 40 units at bedtime. -Discussed the importance of the influenza and pneumococcal vaccines -A1c=14 (12/02/17)  CKD:  Scr = 1.71 mg/dl; BUN = 32 mg/dl; GFR = 42 ml/min (12/16/17); Scr = 1.35 mg/dl, BUN = 26 mg/dl; (12/04/17) Scr = 2.31 mg/dl; BUN = 49 mg/dl upon admission (12/01/17)  HTN:  Currently on clonidine, HCTZ, losartan and metoprolol. BP WNL today: 118/76 mmHg.  Electrolytes (12/16/17) -Na = 145 -K = 4.9 -Chloride = 100 -Magnesium = 1.9 mg/dl  Palpitations:  -Patient is on metoprolol 50 mg twice daily. -Patient states palpitations are "not as bad lately since hospitalization".  -Heart rate today was "regular" at 50 bpm; states pulse typically is in the 50's and he occasionally experiences dizziness upon standing.  -Counseled patient to get up slowly and make sure he stays hydrated. Also encouraged patient to discuss  with his provider at [facility] if symptoms persist.   GOUT:  -No recent flares. Currently on allopurinol 100 mg daily.  -Of note, patient is also on HCTZ 25 mg daily. Recommend monitoring uric acid and symptoms.  CHF:  -Patient states he was told years ago that he might have CHF; not currently being treat for CHF.  -Cardiologist ordered echocardiogram and Myoview.  HLD:  -Currently on atorvastatin 10 mg daily; started 01/05/18 -Right leg pain, feels like cramping upon awakening; states pain started post atorvastatin  -pharmacist to contact [facility] with a note  -encouraged patient to discuss with [facility] on 01/21/18  -would recommend trial of rosuvastatin -TC=222 mg/dl; TG=233 mg/dl; HDL=28 mg/dl; LDL=147 mg/dl (12/16/17)  PLAN: Follow up appointment with [facility] scheduled for 01/21/18  -pharmacist to send note to [facility] regarding recent  leg pain and dizziness RTC in 3-4 months for pharmacist visit    Darran Gabay K. Dicky Doe, PharmD [facility] [place] Operations Coordinator [phone]

## 2018-01-21 ENCOUNTER — Ambulatory Visit: Payer: Self-pay | Admitting: Adult Health

## 2018-01-21 ENCOUNTER — Encounter: Payer: Self-pay | Admitting: Adult Health

## 2018-01-21 VITALS — BP 123/83 | HR 48 | Temp 97.5°F | Wt 286.6 lb

## 2018-01-21 DIAGNOSIS — E118 Type 2 diabetes mellitus with unspecified complications: Secondary | ICD-10-CM

## 2018-01-21 DIAGNOSIS — E1140 Type 2 diabetes mellitus with diabetic neuropathy, unspecified: Secondary | ICD-10-CM

## 2018-01-21 DIAGNOSIS — I10 Essential (primary) hypertension: Secondary | ICD-10-CM

## 2018-01-21 DIAGNOSIS — Z794 Long term (current) use of insulin: Secondary | ICD-10-CM

## 2018-01-21 DIAGNOSIS — M109 Gout, unspecified: Secondary | ICD-10-CM

## 2018-01-21 MED ORDER — HYDROCHLOROTHIAZIDE 25 MG PO TABS: 25.0000 mg | ORAL_TABLET | Freq: Every day | ORAL | 3 refills | Status: DC

## 2018-01-21 MED ORDER — GABAPENTIN 100 MG PO CAPS: 100.0000 mg | ORAL_CAPSULE | Freq: Two times a day (BID) | ORAL | 0 refills | Status: DC

## 2018-01-21 MED ORDER — CLONIDINE HCL 0.3 MG PO TABS: 0.3000 mg | ORAL_TABLET | Freq: Two times a day (BID) | ORAL | 3 refills | Status: DC

## 2018-01-21 MED ORDER — INSULIN DETEMIR 100 UNIT/ML ~~LOC~~ SOLN: 30.0000 [IU] | Freq: Every day | SUBCUTANEOUS | 3 refills | Status: DC

## 2018-01-21 MED ORDER — METOPROLOL TARTRATE 100 MG PO TABS: 50.0000 mg | ORAL_TABLET | Freq: Two times a day (BID) | ORAL | 3 refills | Status: DC

## 2018-01-21 MED ORDER — AMITRIPTYLINE HCL 25 MG PO TABS: 25.0000 mg | ORAL_TABLET | Freq: Every day | ORAL | 3 refills | Status: DC

## 2018-01-21 MED ORDER — LANCETS MISC: 1.0000 | Freq: Three times a day (TID) | 4 refills | Status: DC

## 2018-01-21 MED ORDER — ASPIRIN EC 81 MG PO TBEC: 81.0000 mg | DELAYED_RELEASE_TABLET | Freq: Every day | ORAL | 3 refills | Status: DC

## 2018-01-21 MED ORDER — METFORMIN HCL 500 MG PO TABS: 500.0000 mg | ORAL_TABLET | Freq: Two times a day (BID) | ORAL | 3 refills | Status: DC

## 2018-01-21 MED ORDER — GLUCOSE BLOOD VI STRP
1.0000 | ORAL_STRIP | Freq: Three times a day (TID) | 4 refills | Status: DC
Start: 2018-01-21 — End: 2019-02-03

## 2018-01-21 MED ORDER — GABAPENTIN 300 MG PO CAPS: 300.0000 mg | ORAL_CAPSULE | Freq: Every day | ORAL | 3 refills | Status: DC

## 2018-01-21 MED ORDER — ATORVASTATIN CALCIUM 10 MG PO TABS: 10.0000 mg | ORAL_TABLET | Freq: Every day | ORAL | 3 refills | Status: DC

## 2018-01-21 MED ORDER — LOSARTAN POTASSIUM 50 MG PO TABS: 50.0000 mg | ORAL_TABLET | Freq: Every day | ORAL | 3 refills | Status: DC

## 2018-01-21 MED ORDER — ALLOPURINOL 100 MG PO TABS: 100.0000 mg | ORAL_TABLET | Freq: Every day | ORAL | 3 refills | Status: DC

## 2018-01-21 MED ORDER — "INSULIN SYRINGE 31G X 5/16"" 1 ML MISC": 1.0000 | Freq: Every day | 3 refills | Status: DC

## 2018-01-21 NOTE — Progress Notes (Signed)
Patient: Steve Buck. Male    DOB: Apr 20, 1956   62 y.o.   MRN: [id number] Visit Date: 01/21/2018  Today's Provider: Deforest Hoyles, NP   Chief Complaint  Patient presents with  . Follow-up   Subjective:    HPI This is a pleasant 62 year old male who presents for routine follow-up of hypertension, hyperlipidemia gout and diabetes.  He is going on a trip and wanted to be seen prior to his departure.  He offers no complaints.  He has been trending down his insulin as his blood glucose levels have been better controlled.  Fasting levels are ranging below 100 mg/dL and postprandial levels less than 150 mg/dL.  He has been seen by the endocrinologist and he has been sending his blood glucose logs to the endocrinologist every week.  He is currently on 30 units of Levemir at bedtime and also metformin 500 mg twice a day.  He denies any hypo-glycemic episodes.  His blood pressure has been well controlled.  He is taking all his medications as prescribed and making an effort to adhere to a low-carb low-fat diet.  He denies any chest pain palpitations nausea vomiting diarrhea and dizziness.  He needs a 23-monthrefill on all his medications   Allergies  Allergen Reactions  . Lisinopril    Previous Medications   ALLOPURINOL (ZYLOPRIM) 100 MG TABLET    Take 1 tablet (100 mg total) by mouth daily.   AMITRIPTYLINE (ELAVIL) 25 MG TABLET    Take 1 tablet (25 mg total) by mouth at bedtime.   ASPIRIN EC 81 MG TABLET    Take 1 tablet (81 mg total) by mouth daily.   ATORVASTATIN (LIPITOR) 10 MG TABLET    Take 1 tablet (10 mg total) by mouth daily.   CLONIDINE (CATAPRES) 0.3 MG TABLET    Take 1 tablet (0.3 mg total) by mouth 2 (two) times daily.   GABAPENTIN (NEURONTIN) 100 MG CAPSULE    Take 1 capsule (100 mg total) by mouth 2 (two) times daily.   GABAPENTIN (NEURONTIN) 300 MG CAPSULE    Take 1 capsule (300 mg total) by mouth at bedtime.   GLUCOSE BLOOD TEST STRIP    1 each by Other route 3  (three) times daily. Use as instructed   HYDROCHLOROTHIAZIDE (HYDRODIURIL) 25 MG TABLET    Take 1 tablet (25 mg total) by mouth daily.   INSULIN DETEMIR (LEVEMIR) 100 UNIT/ML INJECTION    Inject 0.5 mLs (50 Units total) into the skin at bedtime.   INSULIN SYRINGE-NEEDLE U-100 (INSULIN SYRINGE 1CC/31GX5/16") 31G X 5/16" 1 ML MISC    1 each by Does not apply route daily. For Lantus insulin   LANCETS MISC    1 each by Does not apply route 3 (three) times daily.   LOSARTAN (COZAAR) 50 MG TABLET    Take 1 tablet (50 mg total) by mouth at bedtime.   METFORMIN (GLUCOPHAGE) 500 MG TABLET    Take 1 tablet (500 mg total) by mouth 2 (two) times daily with a meal.   METOPROLOL TARTRATE (LOPRESSOR) 100 MG TABLET    Take 0.5 tablets (50 mg total) by mouth 2 (two) times daily.   POTASSIUM CHLORIDE SA (K-DUR,KLOR-CON) 20 MEQ TABLET    Take 20 mEq by mouth daily.    Review of Systems  Constitutional: Negative.   Eyes: Negative.   Respiratory: Negative.   Cardiovascular: Negative.   Gastrointestinal: Negative.   Endocrine: Negative.   Musculoskeletal: Negative.   Skin:  Negative.   Neurological: Negative.     Social History   Tobacco Use  . Smoking status: Never Smoker  . Smokeless tobacco: Never Used  Substance Use Topics  . Alcohol use: Not Currently   Objective:   BP 123/83   Pulse (!) 48   Temp (!) 97.5 F (36.4 C)   Wt 286 lb 9.6 oz (130 kg)   BMI 37.81 kg/m   Physical Exam  Constitutional: He is oriented to person, place, and time. He appears well-developed and well-nourished.  Eyes: Pupils are equal, round, and reactive to light.  Neck: Normal range of motion. Neck supple. No JVD present.  Cardiovascular: Normal rate, regular rhythm, normal heart sounds and intact distal pulses.  Pulmonary/Chest: Effort normal and breath sounds normal.  Abdominal: Soft. Bowel sounds are normal.  Musculoskeletal: Normal range of motion.  Neurological: He is alert and oriented to person, place, and  time.  Skin: Skin is warm and dry.  Psychiatric: He has a normal mood and affect.  Nursing note and vitals reviewed.       Assessment & Plan:  1. Essential hypertension Well-controlled.  Continue current medications  2. Gout, unspecified cause, unspecified chronicity, unspecified site Well-controlled.  Continue allopurinol for prophylaxis  3. Controlled type 2 diabetes mellitus with complication, with long-term current use of insulin ([place]) Well-controlled.  Will obtain a repeat hemoglobin A1c  4. Hypertension associated with type 2 diabetes mellitus Well-controlled on current medications. - cloNIDine (CATAPRES) 0.3 MG tablet; Take 1 tablet (0.3 mg total) by mouth 2 (two) times daily.  Dispense: 180 tablet; Refill: 3 - metoprolol tartrate (LOPRESSOR) 100 MG tablet; Take 0.5 tablets (50 mg total) by mouth 2 (two) times daily.  Dispense: 180 tablet; Refill: 3  5. Type 2 diabetes mellitus with diabetic neuropathy, with long-term current use of insulin ([place]) Well-controlled.  Refills provided - Comp Met (CMET) - Lipid Profile - HgB A1c   Return to clinic upon return from trip Will call if any abnormal labs  Deforest Hoyles, NP   [facility]

## 2018-01-22 LAB — LIPID PANEL
Chol/HDL Ratio: 6.9 ratio — ABNORMAL HIGH (ref 0.0–5.0)
Cholesterol, Total: 165 mg/dL (ref 100–199)
HDL: 24 mg/dL — ABNORMAL LOW (ref >39)
LDL CALC: 95 mg/dL (ref 0–99)
Triglycerides: 228 mg/dL — ABNORMAL HIGH (ref 0–149)
VLDL CHOLESTEROL CAL: 46 mg/dL — AB (ref 5–40)

## 2018-01-22 LAB — COMPREHENSIVE METABOLIC PANEL
ALBUMIN: 4 g/dL (ref 3.6–4.8)
ALT: 14 IU/L (ref 0–44)
AST: 19 IU/L (ref 0–40)
Albumin/Globulin Ratio: 1.3 (ref 1.2–2.2)
Alkaline Phosphatase: 87 IU/L (ref 39–117)
BILIRUBIN TOTAL: 0.7 mg/dL (ref 0.0–1.2)
BUN / CREAT RATIO: 18 (ref 10–24)
BUN: 36 mg/dL — AB (ref 8–27)
CHLORIDE: 98 mmol/L (ref 96–106)
CO2: 26 mmol/L (ref 20–29)
Calcium: 9.6 mg/dL (ref 8.6–10.2)
Creatinine, Ser: 2.01 mg/dL — ABNORMAL HIGH (ref 0.76–1.27)
GFR calc non Af Amer: 34 mL/min/{1.73_m2} — ABNORMAL LOW (ref >59)
GFR, EST AFRICAN AMERICAN: 40 mL/min/{1.73_m2} — AB (ref >59)
GLUCOSE: 94 mg/dL (ref 65–99)
Globulin, Total: 3.2 g/dL (ref 1.5–4.5)
Potassium: 4.2 mmol/L (ref 3.5–5.2)
Sodium: 143 mmol/L (ref 134–144)
TOTAL PROTEIN: 7.2 g/dL (ref 6.0–8.5)

## 2018-01-22 LAB — HEMOGLOBIN A1C
ESTIMATED AVERAGE GLUCOSE: 220 mg/dL
HEMOGLOBIN A1C: 9.3 % — AB (ref 4.8–5.6)

## 2018-02-09 ENCOUNTER — Telehealth: Payer: Self-pay

## 2018-02-09 NOTE — Telephone Encounter (Signed)
Patient was referred to [place]. Dr. Holley Raring.  Patient was to be out of the state until June 27 and patients wife had scheduled the referral appointment on 02/22/18.  Called to verify patient would be back in the state on 02/22/18.

## 2018-02-15 ENCOUNTER — Telehealth: Payer: Self-pay | Admitting: Pharmacist

## 2018-02-15 NOTE — Telephone Encounter (Signed)
02/15/2018 11:19:03 AM - Levemir Vials  02/15/18 Faxed [employer] application for [employer] Inject 30 units under the skin once a day before bedtime.Delos Haring

## 2018-03-25 ENCOUNTER — Encounter: Payer: Self-pay | Admitting: Adult Health

## 2018-03-25 ENCOUNTER — Ambulatory Visit: Payer: Self-pay | Admitting: Ophthalmology

## 2018-03-25 ENCOUNTER — Telehealth: Payer: Self-pay

## 2018-03-25 ENCOUNTER — Ambulatory Visit: Payer: Self-pay | Admitting: Adult Health

## 2018-03-25 VITALS — BP 120/74 | HR 48 | Temp 97.5°F | Ht 73.0 in | Wt 268.5 lb

## 2018-03-25 DIAGNOSIS — E1159 Type 2 diabetes mellitus with other circulatory complications: Secondary | ICD-10-CM

## 2018-03-25 DIAGNOSIS — Z794 Long term (current) use of insulin: Secondary | ICD-10-CM

## 2018-03-25 DIAGNOSIS — R21 Rash and other nonspecific skin eruption: Secondary | ICD-10-CM

## 2018-03-25 DIAGNOSIS — H9312 Tinnitus, left ear: Secondary | ICD-10-CM

## 2018-03-25 DIAGNOSIS — I152 Hypertension secondary to endocrine disorders: Secondary | ICD-10-CM

## 2018-03-25 DIAGNOSIS — E119 Type 2 diabetes mellitus without complications: Secondary | ICD-10-CM

## 2018-03-25 DIAGNOSIS — E1165 Type 2 diabetes mellitus with hyperglycemia: Secondary | ICD-10-CM

## 2018-03-25 DIAGNOSIS — N183 Chronic kidney disease, stage 3 unspecified: Secondary | ICD-10-CM

## 2018-03-25 DIAGNOSIS — E1140 Type 2 diabetes mellitus with diabetic neuropathy, unspecified: Secondary | ICD-10-CM

## 2018-03-25 DIAGNOSIS — E1121 Type 2 diabetes mellitus with diabetic nephropathy: Secondary | ICD-10-CM | POA: Insufficient documentation

## 2018-03-25 DIAGNOSIS — I10 Essential (primary) hypertension: Principal | ICD-10-CM

## 2018-03-25 DIAGNOSIS — M1A9XX0 Chronic gout, unspecified, without tophus (tophi): Secondary | ICD-10-CM

## 2018-03-25 DIAGNOSIS — IMO0002 Reserved for concepts with insufficient information to code with codable children: Secondary | ICD-10-CM

## 2018-03-25 HISTORY — DX: Type 2 diabetes mellitus with diabetic nephropathy: E11.21

## 2018-03-25 LAB — HM DIABETES EYE EXAM

## 2018-03-25 MED ORDER — AMITRIPTYLINE HCL 25 MG PO TABS: 25.0000 mg | ORAL_TABLET | Freq: Every day | ORAL | 3 refills | Status: DC

## 2018-03-25 MED ORDER — HYDROCORTISONE 2.5 % EX OINT: TOPICAL_OINTMENT | Freq: Two times a day (BID) | CUTANEOUS | 3 refills | Status: DC

## 2018-03-25 MED ORDER — GABAPENTIN 300 MG PO CAPS: 300.0000 mg | ORAL_CAPSULE | Freq: Every day | ORAL | 3 refills | Status: DC

## 2018-03-25 MED ORDER — GABAPENTIN 100 MG PO CAPS: 100.0000 mg | ORAL_CAPSULE | Freq: Two times a day (BID) | ORAL | 3 refills | Status: DC

## 2018-03-25 NOTE — Telephone Encounter (Signed)
I called and SW Michaelle Copas, Glass blower/designer at [employer] she stated she had made Mr. Steve Buck an appointment on June 10.  He called and cancelled and was to call back to rescheduled and never did.  She encouraged me to have patient call back and schedule an appointment.  Relayed message to PCP Magdalene S. Tukov-Yual, NP.

## 2018-03-25 NOTE — Progress Notes (Signed)
Patient: Steve Buck. Male    DOB: 02/02/1956   62 y.o.   MRN: [id number] Visit Date: 03/25/2018  Today's Provider: Deforest Hoyles, NP   No chief complaint on file.  Subjective:    HPI 62 Y/O male presenting for f/u hypertension, T2DM, gout, and diabetic nephropathy.  He is complaining of an erythematous pruritic rash that started while he was in [place].  He reports using over-the-counter remedies without any significant relief.  He denies having similar symptoms in the past.  No associated fever, shortness of breath and ulceration.  States that rash has improved since he returned from [place].  His blood pressure readings have been good with all readings less than 140/80.   His blood glucose levels are 120mg /dl.  He is off insulin and only taking metformin at this point.  He denies hypoglycemic episodes.  He reports right flank pain which he states interferes with his ability to sleep on his left side.  States that pain waxes and wanes and today the pain is gone. He ran out of his Neurontin 300 mg [place]  He took 2 tablets of the 100 mg while he was there state that it helps with his neuropathic pain.  He denies any lower extremity ulceration. His last labs showed worsening creatinine from 1.7-2.01.  He has not seen nephrology before. Regarding his gout, he denies any flareup.  He is on allopurinol. He reports daily exercise while he was in [place] and even after he returned.  His weight dropped from 286 pounds to 268 pounds.  He reports adhering to his low concentrated sweets diet. He reports good sleep quality except when he sleeps on his right side. Overall patient is doing well and making good progress over controlling his chronic health problems.  He denies chest pain palpitations nausea vomiting but reports constipation.  He takes fiber supplements as well as stool softeners to be able to have regular bowel movements   Allergies  Allergen Reactions  . Lisinopril     Previous Medications   ALLOPURINOL (ZYLOPRIM) 100 MG TABLET    Take 1 tablet (100 mg total) by mouth daily.   AMITRIPTYLINE (ELAVIL) 25 MG TABLET    Take 1 tablet (25 mg total) by mouth at bedtime.   ASPIRIN EC 81 MG TABLET    Take 1 tablet (81 mg total) by mouth daily.   ATORVASTATIN (LIPITOR) 10 MG TABLET    Take 1 tablet (10 mg total) by mouth daily.   CLONIDINE (CATAPRES) 0.3 MG TABLET    Take 1 tablet (0.3 mg total) by mouth 2 (two) times daily.   GABAPENTIN (NEURONTIN) 100 MG CAPSULE    Take 1 capsule (100 mg total) by mouth 2 (two) times daily. One tablet in the morning and one tablet in the afternoon   GABAPENTIN (NEURONTIN) 300 MG CAPSULE    Take 1 capsule (300 mg total) by mouth at bedtime.   GLUCOSE BLOOD TEST STRIP    1 each by Other route 3 (three) times daily. Use as instructed   HYDROCHLOROTHIAZIDE (HYDRODIURIL) 25 MG TABLET    Take 1 tablet (25 mg total) by mouth daily.   INSULIN DETEMIR (LEVEMIR) 100 UNIT/ML INJECTION    Inject 0.3 mLs (30 Units total) into the skin at bedtime.   INSULIN SYRINGE-NEEDLE U-100 (INSULIN SYRINGE 1CC/31GX5/16") 31G X 5/16" 1 ML MISC    1 each by Does not apply route daily. For Lantus insulin   LANCETS MISC  1 each by Does not apply route 3 (three) times daily.   LOSARTAN (COZAAR) 50 MG TABLET    Take 1 tablet (50 mg total) by mouth at bedtime.   METFORMIN (GLUCOPHAGE) 500 MG TABLET    Take 1 tablet (500 mg total) by mouth 2 (two) times daily with a meal.   METOPROLOL TARTRATE (LOPRESSOR) 100 MG TABLET    Take 0.5 tablets (50 mg total) by mouth 2 (two) times daily.   POTASSIUM CHLORIDE SA (K-DUR,KLOR-CON) 20 MEQ TABLET    Take 20 mEq by mouth daily.    Review of Systems  Constitutional: Negative for diaphoresis, fatigue and fever.  HENT: Negative.   Eyes: Negative.   Respiratory: Negative.   Cardiovascular: Negative.   Gastrointestinal: Negative.   Endocrine: Negative.   Genitourinary: Negative.   Musculoskeletal: Positive for back pain  (right sideed back pain that waxes and wanes ).  Skin: Negative.   Allergic/Immunologic:       Pruritic rash  Neurological: Negative for dizziness, numbness and headaches.       Tinnitus  Psychiatric/Behavioral: Negative.     Social History   Tobacco Use  . Smoking status: Never Smoker  . Smokeless tobacco: Never Used  Substance Use Topics  . Alcohol use: Not Currently   Objective:   There were no vitals taken for this visit.  Physical Exam  Constitutional: He is oriented to person, place, and time. He appears well-developed and well-nourished.  HENT:  Mouth/Throat: Oropharynx is clear and moist.  Eyes: Pupils are equal, round, and reactive to light. Conjunctivae and EOM are normal.  Neck: Normal range of motion. Neck supple.  Cardiovascular: Normal rate, regular rhythm, normal heart sounds and intact distal pulses.  Apical pulse 62 bpm,  regular  Pulmonary/Chest: Effort normal and breath sounds normal.  Abdominal: Soft. Bowel sounds are normal.  Musculoskeletal: Normal range of motion.  Neurological: He is alert and oriented to person, place, and time.  Monofilament exam normal  Skin: Skin is warm and dry.  Psychiatric: He has a normal mood and affect.  Nursing note and vitals reviewed.     Assessment & Plan:  1. Diabetic nephropathy associated with type 2 diabetes mellitus (HCC) -Creatinine trending up. -Repeat labs in 1 month - Nephrology referral  2. Hypertension associated with diabetes ([place]) -Well-controlled.  Continue losartan, metoprolol, clonidine and hydrochlorothiazide stop - Repeat labs in 1 month - Continue low-salt diet  3. Type 2 diabetes mellitus treated with insulin ([place]) -Well-controlled off insulin.  Continue metformin, blood glucose monitoring and low concentrated sweets diet. -Repeat hemoglobin A1c in 1 month -Continue follow-up with endocrinology  4. Type 2 diabetes mellitus, uncontrolled, with neuropathy (HCC) -Neuropathic pain appears  well controlled with improved glycemic control -Continue Neurontin as scheduled  5. Chronic gout without tophus, unspecified cause, unspecified site -No acute flareup; continue allopurinol for prophylaxis  6. CKD (chronic kidney disease), stage III ([place]) -CKD due to diabetes; creatinine trending up - Ambulatory referral to Nephrology -We will monitor and correct electrolyte imbalances  7. Rash - Likely allergic dermatitis; no systemic symptoms - Start hydrocortisone 2.5% ointment twice daily -Eucerin cream with Vaseline to keep skin moisturized  8. Tinnitus: chronic; will refer to neurology for further evaluation     Deforest Hoyles, NP   [facility] [address] [facility]

## 2018-03-29 NOTE — Progress Notes (Signed)
Follow up Diabetes/ Endocrine [facility]     Patient ID: Steve Buck., male   DOB: 1956-08-31, 62 y.o.   MRN: [id number] Assessment:     1. Diabetes mellitus without complication ([place])   2. Hypertension associated with diabetes ([place])   3. Neuropathy   4. Type 2 diabetes mellitus with ketosis (HCC)   5. History of Bell's palsy   6. CKD (chronic kidney disease) stage 3, GFR 30-59 ml/min (HCC)        Plan     Plan    Patient Instructions  A1C looks great today (5.8%) !  Keep up good work with lifestyle and taking metformin regularly.  We will recheck labs today, particularly the kidney function and urine protein due to prior abnormality.  It is still possible you'll need to see nephrology. We noted HR is 50 and PCP may wish to lower metoprolol at next visit.     Orders Placed This Encounter  Procedures  . Comprehensive metabolic panel  . Urine Microalbumin w/creat. ratio  . Urinalysis  . CBC w/Diff  . Magnesium  . Phosphorus  . Lipid Profile  . POCT Glucose (CBG)  . POCT HgB A1C   Subjective:   Steve Buck is a 29 male diagnosed with T2DM 11/2017 presenting for follow-up management of T2DM. Pt is no longer taking insulin, tapered off from March-June 2019. Pt taking    Pt was in [place] for 2 months, walked 2 miles daily and spend most of the day   Metformin - 2x daily amitryptiline ran out No longer taking insulin, tapered off by early may  POC A1C at 5.8%!! (down from 9.3 in May and 14% in April during hospitalization)   Autoimmunity- only has a history of Bell's palsy otherwise no known personal or family history. GAD antibody not performed. May be needed in the future.      Review of Systems  Constitutional:       Wt loss since DM diagnosis, approx 40 lbs   Eyes: Negative for visual disturbance.  Respiratory: Negative.   Cardiovascular: Negative.   Endocrine: Negative for polydipsia, polyphagia and polyuria.  Neurological: Positive for numbness.        Neuropathic symptoms ongoing x 20 years, unclear if related to DM  All other systems reviewed and are negative.      Steve Jerilynn Mages Dineen Kid.  has a past medical history of Allergy, CHF (congestive heart failure) ([place]), CKD (chronic kidney disease) stage 3, GFR 30-59 ml/min ([place]) (03/30/2018), Diabetic nephropathy associated with type 2 diabetes mellitus ([place]) (03/25/2018), DKA (diabetic ketoacidoses) ([place]) (12/01/2017), Heart murmur, History of Bell's palsy (03/30/2018), Hypertension, Type 2 diabetes mellitus treated with insulin ([place]), and Type 2 diabetes mellitus, uncontrolled, with neuropathy ([place]).   Steve Robley Fries. family history includes Arthritis in his father; CAD in his father; Cancer in his mother.  Steve Jerilynn Mages Dineen Kid.  reports that he has never smoked. He has never used smokeless tobacco. He reports that he drank alcohol. He reports that he has current or past drug history. Drug: Marijuana.   Current Outpatient Medications:  .  allopurinol (ZYLOPRIM) 100 MG tablet, Take 1 tablet (100 mg total) by mouth daily., Disp: 90 tablet, Rfl: 3 .  aspirin EC 81 MG tablet, Take 1 tablet (81 mg total) by mouth daily., Disp: 90 tablet, Rfl: 3 .  atorvastatin (LIPITOR) 10 MG tablet, Take 1 tablet (10 mg total) by mouth daily., Disp: 90 tablet, Rfl: 3 .  cloNIDine (CATAPRES) 0.3 MG tablet, Take 1 tablet (0.3 mg total) by mouth 2 (two) times daily., Disp: 180 tablet, Rfl: 3 .  gabapentin (NEURONTIN) 100 MG capsule, Take 1 capsule (100 mg total) by mouth 2 (two) times daily. One tablet in the morning and one tablet in the afternoon, Disp: 180 capsule, Rfl: 3 .  gabapentin (NEURONTIN) 300 MG capsule, Take 1 capsule (300 mg total) by mouth at bedtime., Disp: 90 capsule, Rfl: 3 .  glucose blood test strip, 1 each by Other route 3 (three) times daily. Use as instructed, Disp: 300 each, Rfl: 4 .  hydrochlorothiazide (HYDRODIURIL) 25 MG tablet, Take 1 tablet (25 mg total) by mouth daily., Disp: 90  tablet, Rfl: 3 .  hydrocortisone 2.5 % ointment, Apply topically 2 (two) times daily., Disp: 30 g, Rfl: 3 .  Lancets MISC, 1 each by Does not apply route 3 (three) times daily., Disp: 300 each, Rfl: 4 .  losartan (COZAAR) 50 MG tablet, Take 1 tablet (50 mg total) by mouth at bedtime., Disp: 90 tablet, Rfl: 3 .  metFORMIN (GLUCOPHAGE) 500 MG tablet, Take 1 tablet (500 mg total) by mouth 2 (two) times daily with a meal., Disp: 180 tablet, Rfl: 3 .  metoprolol tartrate (LOPRESSOR) 100 MG tablet, Take 0.5 tablets (50 mg total) by mouth 2 (two) times daily., Disp: 180 tablet, Rfl: 3 .  potassium chloride SA (K-DUR,KLOR-CON) 20 MEQ tablet, Take 20 mEq by mouth daily., Disp: , Rfl:  .  amitriptyline (ELAVIL) 25 MG tablet, Take 1 tablet (25 mg total) by mouth at bedtime. (Patient not taking: Reported on 03/30/2018), Disp: 90 tablet, Rfl: 3  Allergies  Allergen Reactions  . Lisinopril    Objective:    Physical Exam  Constitutional: He is oriented to person, place, and time. He appears well-developed and well-nourished. No distress.  HENT:  Head: Normocephalic and atraumatic.  Eyes: Pupils are equal, round, and reactive to light. EOM are normal.  Neck: No thyromegaly present.  Cardiovascular: Normal rate, regular rhythm, normal heart sounds and intact distal pulses. Exam reveals no gallop and no friction rub.  No murmur heard. Pulmonary/Chest: Effort normal and breath sounds normal. No respiratory distress. He has no wheezes.  Musculoskeletal: Normal range of motion. He exhibits no edema, tenderness or deformity.  Lymphadenopathy:    He has no cervical adenopathy.  Neurological: He is alert and oriented to person, place, and time. No sensory deficit.  Skin: Skin is warm and dry. No rash noted.  Psychiatric: He has a normal mood and affect. Thought content normal.  Nursing note and vitals reviewed.      Vitals:   03/30/18 1743  BP: 113/69  Pulse: (!) 50  Temp: 98.1 F (36.7 C)    Data :  I have personally reviewed pertinent labs and imaging studies, if indicated,  with the patient in clinic today.    Lab Orders     Comprehensive metabolic panel     Urine Microalbumin w/creat. ratio     Urinalysis     CBC w/Diff     Magnesium     Phosphorus     Lipid Profile     POCT Glucose (CBG)     POCT HgB A1C  HC Readings from Last 3 Encounters:  No data found for [facility]    Wt Readings from Last 3 Encounters:  03/30/18 269 lb 11.2 oz (122.3 kg)  03/25/18 268 lb 8 oz (121.8 kg)  01/21/18 286 lb 9.6 oz (130 kg)  A1C today (POC ) at 5.8% down from 9.3% at prior visit

## 2018-03-30 ENCOUNTER — Ambulatory Visit: Payer: Self-pay | Admitting: Endocrinology

## 2018-03-30 VITALS — BP 113/69 | HR 50 | Temp 98.1°F | Ht 73.0 in | Wt 269.7 lb

## 2018-03-30 DIAGNOSIS — I10 Essential (primary) hypertension: Secondary | ICD-10-CM

## 2018-03-30 DIAGNOSIS — N183 Chronic kidney disease, stage 3 unspecified: Secondary | ICD-10-CM

## 2018-03-30 DIAGNOSIS — E119 Type 2 diabetes mellitus without complications: Secondary | ICD-10-CM | POA: Insufficient documentation

## 2018-03-30 DIAGNOSIS — N1832 Chronic kidney disease, stage 3b: Secondary | ICD-10-CM | POA: Insufficient documentation

## 2018-03-30 DIAGNOSIS — E1159 Type 2 diabetes mellitus with other circulatory complications: Secondary | ICD-10-CM

## 2018-03-30 DIAGNOSIS — E1110 Type 2 diabetes mellitus with ketoacidosis without coma: Secondary | ICD-10-CM

## 2018-03-30 DIAGNOSIS — G629 Polyneuropathy, unspecified: Secondary | ICD-10-CM

## 2018-03-30 DIAGNOSIS — Z8669 Personal history of other diseases of the nervous system and sense organs: Secondary | ICD-10-CM | POA: Insufficient documentation

## 2018-03-30 HISTORY — DX: Personal history of other diseases of the nervous system and sense organs: Z86.69

## 2018-03-30 HISTORY — DX: Chronic kidney disease, stage 3 unspecified: N18.30

## 2018-03-30 LAB — POCT GLYCOSYLATED HEMOGLOBIN (HGB A1C): HbA1c POC (<> result, manual entry): 5.8 % (ref 4.0–5.6)

## 2018-03-30 LAB — GLUCOSE, POCT (MANUAL RESULT ENTRY): POC Glucose: 98 mg/dL (ref 70–99)

## 2018-03-30 NOTE — Patient Instructions (Addendum)
A1C looks great today (5.8%) !  Keep up good work with lifestyle and taking metformin regularly.  We will recheck labs today, particularly the kidney function and urine protein due to prior abnormality.  It is still possible you'll need to see nephrology. We noted HR is 50 and PCP may wish to lower metoprolol at next visit.

## 2018-03-31 ENCOUNTER — Other Ambulatory Visit: Payer: Self-pay | Admitting: Ophthalmology

## 2018-03-31 ENCOUNTER — Encounter: Payer: Self-pay | Admitting: Endocrinology

## 2018-03-31 LAB — URINALYSIS
BILIRUBIN UA: NEGATIVE
Glucose, UA: NEGATIVE
Ketones, UA: NEGATIVE
Leukocytes, UA: NEGATIVE
Nitrite, UA: NEGATIVE
PH UA: 6 (ref 5.0–7.5)
RBC, UA: NEGATIVE
Specific Gravity, UA: 1.017 (ref 1.005–1.030)
UUROB: 0.2 mg/dL (ref 0.2–1.0)

## 2018-03-31 LAB — CBC WITH DIFFERENTIAL/PLATELET
BASOS: 0 %
Basophils Absolute: 0 10*3/uL (ref 0.0–0.2)
EOS (ABSOLUTE): 0.3 10*3/uL (ref 0.0–0.4)
Eos: 4 %
HEMATOCRIT: 38.1 % (ref 37.5–51.0)
HEMOGLOBIN: 12.4 g/dL — AB (ref 13.0–17.7)
IMMATURE GRANULOCYTES: 0 %
Immature Grans (Abs): 0 10*3/uL (ref 0.0–0.1)
Lymphocytes Absolute: 2.9 10*3/uL (ref 0.7–3.1)
Lymphs: 37 %
MCH: 30.6 pg (ref 26.6–33.0)
MCHC: 32.5 g/dL (ref 31.5–35.7)
MCV: 94 fL (ref 79–97)
MONOCYTES: 9 %
Monocytes Absolute: 0.7 10*3/uL (ref 0.1–0.9)
NEUTROS PCT: 50 %
Neutrophils Absolute: 4 10*3/uL (ref 1.4–7.0)
Platelets: 279 10*3/uL (ref 150–450)
RBC: 4.05 x10E6/uL — AB (ref 4.14–5.80)
RDW: 14.4 % (ref 12.3–15.4)
WBC: 7.9 10*3/uL (ref 3.4–10.8)

## 2018-03-31 LAB — COMPREHENSIVE METABOLIC PANEL
A/G RATIO: 1.5 (ref 1.2–2.2)
ALT: 14 IU/L (ref 0–44)
AST: 16 IU/L (ref 0–40)
Albumin: 4.3 g/dL (ref 3.6–4.8)
Alkaline Phosphatase: 89 IU/L (ref 39–117)
BILIRUBIN TOTAL: 0.7 mg/dL (ref 0.0–1.2)
BUN/Creatinine Ratio: 13 (ref 10–24)
BUN: 25 mg/dL (ref 8–27)
CALCIUM: 9.7 mg/dL (ref 8.6–10.2)
CHLORIDE: 97 mmol/L (ref 96–106)
CO2: 29 mmol/L (ref 20–29)
Creatinine, Ser: 1.9 mg/dL — ABNORMAL HIGH (ref 0.76–1.27)
GFR calc Af Amer: 42 mL/min/{1.73_m2} — ABNORMAL LOW (ref >59)
GFR, EST NON AFRICAN AMERICAN: 37 mL/min/{1.73_m2} — AB (ref >59)
GLOBULIN, TOTAL: 2.9 g/dL (ref 1.5–4.5)
Glucose: 78 mg/dL (ref 65–99)
POTASSIUM: 4.1 mmol/L (ref 3.5–5.2)
SODIUM: 141 mmol/L (ref 134–144)
Total Protein: 7.2 g/dL (ref 6.0–8.5)

## 2018-03-31 LAB — MICROALBUMIN / CREATININE URINE RATIO
CREATININE, UR: 151.1 mg/dL
Microalb/Creat Ratio: 84.1 mg/g creat — ABNORMAL HIGH (ref 0.0–30.0)
Microalbumin, Urine: 127.1 ug/mL

## 2018-03-31 LAB — LIPID PANEL
CHOLESTEROL TOTAL: 133 mg/dL (ref 100–199)
Chol/HDL Ratio: 4.9 ratio (ref 0.0–5.0)
HDL: 27 mg/dL — AB (ref >39)
LDL Calculated: 78 mg/dL (ref 0–99)
TRIGLYCERIDES: 141 mg/dL (ref 0–149)
VLDL CHOLESTEROL CAL: 28 mg/dL (ref 5–40)

## 2018-03-31 LAB — MAGNESIUM: Magnesium: 2 mg/dL (ref 1.6–2.3)

## 2018-03-31 LAB — PHOSPHORUS: Phosphorus: 2.7 mg/dL (ref 2.5–4.5)

## 2018-04-02 ENCOUNTER — Telehealth: Payer: Self-pay

## 2018-04-02 NOTE — Telephone Encounter (Signed)
-----   Message from Staci Acosta, NP sent at 04/01/2018  5:52 PM EDT ----- Labs stable-kidney function improved.

## 2018-04-02 NOTE — Telephone Encounter (Signed)
Spoke with pt. Gave lab results.

## 2018-04-07 ENCOUNTER — Other Ambulatory Visit: Payer: Self-pay

## 2018-04-15 ENCOUNTER — Ambulatory Visit: Payer: Self-pay | Admitting: Adult Health

## 2018-04-19 ENCOUNTER — Encounter: Payer: Self-pay | Admitting: Pharmacist

## 2018-05-03 ENCOUNTER — Encounter (INDEPENDENT_AMBULATORY_CARE_PROVIDER_SITE_OTHER): Payer: Self-pay

## 2018-05-03 ENCOUNTER — Other Ambulatory Visit: Payer: Self-pay | Admitting: Internal Medicine

## 2018-05-03 ENCOUNTER — Encounter: Payer: Self-pay | Admitting: Pharmacist

## 2018-05-03 ENCOUNTER — Other Ambulatory Visit: Payer: Self-pay

## 2018-05-03 ENCOUNTER — Ambulatory Visit: Payer: Self-pay | Admitting: Pharmacist

## 2018-05-03 VITALS — BP 127/70 | HR 42 | Ht 73.0 in | Wt 272.0 lb

## 2018-05-03 DIAGNOSIS — Z79899 Other long term (current) drug therapy: Secondary | ICD-10-CM

## 2018-05-03 NOTE — Progress Notes (Signed)
[facility] Visit Note  Patient: Steve Buck. MRN: [id number] Date of Birth: 1956/09/05 PCP: Staci Acosta, NP   [name] 62 y.o. male presents for a 8-month medication therapy management visit with the pharmacist. Steve Buck is having palpitations again and has noticed several skipped beats.  BP 127/70 (BP Location: Right Arm, Patient Position: Sitting, Cuff Size: Large)   Pulse (!) 42 Comment: Difficult to detect; irregularly irregular  Ht 6\' 1"  (1.854 m)   Wt 272 lb (123.4 kg)   BMI 35.89 kg/m   Patient Information   Past Medical History:  Diagnosis Date  . Allergy    Seasonal  . CHF (congestive heart failure) ([place])   . CKD (chronic kidney disease) stage 3, GFR 30-59 ml/min (HCC) 03/30/2018  . Diabetic nephropathy associated with type 2 diabetes mellitus ([place]) 03/25/2018  . DKA (diabetic ketoacidoses) ([place]) 12/01/2017  . Heart murmur   . History of Bell's palsy 03/30/2018  . Hypertension   . Type 2 diabetes mellitus treated with insulin ([place])   . Type 2 diabetes mellitus, uncontrolled, with neuropathy ([place])       Past Surgical History:  Procedure Laterality Date  . FOOT SURGERY  around 47     Family History  Problem Relation Age of Onset  . Cancer Mother        Skin  . CAD Father   . Arthritis Father     New Diagnoses (since last visit): n/a  Family Support: Good  Lifestyle Diet: Breakfast: eggs, bacon, grits or toast when wife is home or Fiber One cereal Lunch: chicken breast or salad or veggies Dinner: mostly chicken, potatoes, veggies and rice Drinks: water, Crystal Lite and coffee    Current Exercise Habits: Home exercise routine, Frequency (Times/Week): 3, Intensity: Moderate  Exercise limited by: None identified    Social History   Substance and Sexual Activity  Alcohol Use Not Currently      Social History   Tobacco Use  Smoking Status Never Smoker  Smokeless Tobacco Never Used       Health Maintenance  Topic Date Due  . Hepatitis C Screening  [birth year]  . PNEUMOCOCCAL POLYSACCHARIDE VACCINE AGE 59-64 HIGH RISK  04/26/1956  . FOOT EXAM  04/26/1964  . HIV Screening  04/26/1969  . TETANUS/TDAP  04/26/1973  . COLONOSCOPY  04/26/2004  . INFLUENZA VACCINE  04/15/2018  . HEMOGLOBIN A1C  09/30/2018  . OPHTHALMOLOGY EXAM  03/26/2019   Health Maintenance/Date Completed  Last ED visit: 12-01-17 Last Visit to PCP: 04-02-18 Next Visit to PCP: 05-06-18 Specialist Visit: 12-29-17 (Cardiologist); 03-30-18 (Endocrinologist) Dental Exam: none recent Eye Exam: 03-25-18 Prostate Exam: ? Pelvic/PAP Exam: n/a Mammogram: n/a DEXA: n/a Colonoscopy: 5+ years ago Flu Vaccine: none Pneumonia Vaccine: none   Outpatient Encounter Medications as of 05/03/2018  Medication Sig  . allopurinol (ZYLOPRIM) 100 MG tablet Take 1 tablet (100 mg total) by mouth daily.  Marland Kitchen aspirin EC 81 MG tablet Take 1 tablet (81 mg total) by mouth daily.  Marland Kitchen atorvastatin (LIPITOR) 10 MG tablet Take 1 tablet (10 mg total) by mouth daily.  . cloNIDine (CATAPRES) 0.3 MG tablet Take 1 tablet (0.3 mg total) by mouth 2 (two) times daily.  Marland Kitchen gabapentin (NEURONTIN) 100 MG capsule Take 1 capsule (100 mg total) by mouth 2 (two) times daily. One tablet in the morning and one tablet in the afternoon  . gabapentin (NEURONTIN) 300 MG capsule Take 1 capsule (300 mg total) by mouth at bedtime.  Marland Kitchen  glucose blood test strip 1 each by Other route 3 (three) times daily. Use as instructed  . hydrochlorothiazide (HYDRODIURIL) 25 MG tablet Take 1 tablet (25 mg total) by mouth daily.  . hydrocortisone 2.5 % ointment Apply topically 2 (two) times daily.  Marland Kitchen ibuprofen (ADVIL,MOTRIN) 200 MG tablet Take 400 mg by mouth as needed.  . Lancets MISC 1 each by Does not apply route 3 (three) times daily.  Marland Kitchen losartan (COZAAR) 50 MG tablet Take 1 tablet (50 mg total) by mouth at bedtime.  . metFORMIN (GLUCOPHAGE) 500 MG tablet Take 1 tablet (500 mg  total) by mouth 2 (two) times daily with a meal.  . metoprolol tartrate (LOPRESSOR) 50 MG tablet Take 50 mg by mouth 2 (two) times daily.  . potassium chloride SA (K-DUR,KLOR-CON) 20 MEQ tablet Take 20 mEq by mouth daily.  . psyllium (METAMUCIL) 58.6 % powder Take 1 packet by mouth daily.  . [DISCONTINUED] amitriptyline (ELAVIL) 25 MG tablet Take 1 tablet (25 mg total) by mouth at bedtime. (Patient not taking: Reported on 03/30/2018)  . [DISCONTINUED] metoprolol tartrate (LOPRESSOR) 100 MG tablet Take 0.5 tablets (50 mg total) by mouth 2 (two) times daily.   No facility-administered encounter medications on file as of 05/03/2018.     ASSESSMENT:  Compliance:  Patient is compliant with his medications. He brought a list from a previous visit. He is able to convey what each medication is for and how to take the medication. States he rarely misses a dose of medication. Today he was given a pill box for organization.  DM Type 2:  -Currently on metformin, No longer on insulin as of 01/26/18. Patients states he lost over 40 pounds. -Diagnosed in March, during a hospital admission. ( Acute hyperosmolar hyperglycemic state with AKI/CKD)  BG = 763 mg/dl upon admission. -Patient reports average FBS = 90-120 mg/dl and today's reading was 121 mg/dl (ate a piece of cake last night). -Patient has low blood sugar awareness and feels shaky or begins to sweat.  -A1c = 5.8% (03-30-18); A1c=14 (12/02/17)  CKD:  Scr = 1.90 mg/dl; BUN = 13 mg/dl; GFR = 37 ml/min (03/30/18); Scr = 1.71 mg/dl; BUN = 32 mg/dl; GFR = 42 ml/min (12/16/17); Scr = 1.35 mg/dl, BUN = 26 mg/dl; (12/04/17) Scr = 2.31 mg/dl; BUN = 49 mg/dl upon admission (12/01/17)  HTN:  Currently on potassium, clonidine, HCTZ, losartan and metoprolol. BP WNL today: 120/70 mmHg.  Electrolytes (03/30/18) -Na = 141 -K = 4.1 *Patient not sure if he is supposed to continue K+ Rx -Chloride = 97 -Magnesium = 2.0 mg/dl  Palpitations:  -Patient is on  metoprolol 50 mg twice daily. -States the "palpitations" have increased and he notices the irregular heart rate more frequently. -Heart rate today was "irregular" at 42 bpm; states pulse typically is in the 50's and he occasionally experiences dizziness upon standing.   GOUT:  -No recent flares. Currently on allopurinol 100 mg daily.  -Of note, patient is also on HCTZ 25 mg daily. Recommend monitoring uric acid and symptoms.  CHF:  -Patient states he was told years ago that he might have CHF; not currently being treat for CHF.   HLD:  -Currently on atorvastatin 10 mg daily; started 01/05/18 -TC=133 mg/dl; TG=141 mg/dl; HDL=27 mg/dl; LDL=78 mg/dl (03/30/18)  Leg/Feet pain: -Currently on gabapentin 400mg  twice daily and 300 mg at bedtime. 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  -Patient to discuss dose at next PCP appointment on 8/22.  PLAN: Follow  up appointment with [facility] scheduled for 05/06/18; encouraged patient to discuss palpitations and irregular heart rate, gabapentin dose and potassium Rx.  RTC in 6 months for pharmacist visit  Ashland Osmer K. Dicky Doe, PharmD [facility] [place] Operations Coordinator [phone]

## 2018-05-06 ENCOUNTER — Encounter: Payer: Self-pay | Admitting: Adult Health

## 2018-05-06 ENCOUNTER — Ambulatory Visit: Payer: Self-pay | Admitting: Adult Health

## 2018-05-06 VITALS — BP 149/84 | HR 50 | Temp 97.7°F | Ht 71.25 in | Wt 267.2 lb

## 2018-05-06 DIAGNOSIS — E1159 Type 2 diabetes mellitus with other circulatory complications: Secondary | ICD-10-CM

## 2018-05-06 DIAGNOSIS — M1A9XX0 Chronic gout, unspecified, without tophus (tophi): Secondary | ICD-10-CM

## 2018-05-06 DIAGNOSIS — N183 Chronic kidney disease, stage 3 unspecified: Secondary | ICD-10-CM

## 2018-05-06 DIAGNOSIS — R002 Palpitations: Secondary | ICD-10-CM

## 2018-05-06 DIAGNOSIS — I10 Essential (primary) hypertension: Secondary | ICD-10-CM

## 2018-05-06 DIAGNOSIS — I152 Hypertension secondary to endocrine disorders: Secondary | ICD-10-CM

## 2018-05-06 DIAGNOSIS — E1121 Type 2 diabetes mellitus with diabetic nephropathy: Secondary | ICD-10-CM

## 2018-05-06 LAB — POCT GLYCOSYLATED HEMOGLOBIN (HGB A1C)

## 2018-05-06 MED ORDER — METOPROLOL TARTRATE 25 MG PO TABS: 25.0000 mg | ORAL_TABLET | Freq: Two times a day (BID) | ORAL | 3 refills | Status: DC

## 2018-05-06 MED ORDER — METFORMIN HCL 500 MG PO TABS: 500.0000 mg | ORAL_TABLET | Freq: Two times a day (BID) | ORAL | 3 refills | Status: DC

## 2018-05-06 MED ORDER — LOSARTAN POTASSIUM 100 MG PO TABS: 50.0000 mg | ORAL_TABLET | Freq: Every day | ORAL | 3 refills | Status: DC

## 2018-05-06 NOTE — Progress Notes (Signed)
Patient: Steve Buck. Male    DOB: 19-Jan-1956   62 y.o.   MRN: [id number] Visit Date: 05/06/2018  Today's Provider: Deforest Hoyles, NP   Chief Complaint  Patient presents with  . Follow-up    labs review   Subjective:    HPI This is a 62 year old male who presents for follow-up of type 2 diabetes, hypertension, hyperlipidemia and diabetic neuropathy.  He reports doing well.  His last hemoglobin A1c tested by point of care was 5.8% today it is 6.1%.  He reports taking all his medications as prescribed.  He is requesting his Metformin to be discontinued.  His last labs were in July.  His chemistry was normal with normal LFTs.  His HDL is still low at 27 and his LDL is 78.  His creatinine has improved from 2.01 to 1.9.  He also stopped taking his potassium supplements.  His last potassium level was 4.1.  He reports good sleep quality and daily exercise as well as adherence to his diet.  Allergies  Allergen Reactions  . Lisinopril Rash and Cough   Previous Medications   ALLOPURINOL (ZYLOPRIM) 100 MG TABLET    Take 1 tablet (100 mg total) by mouth daily.   ASPIRIN EC 81 MG TABLET    Take 1 tablet (81 mg total) by mouth daily.   ATORVASTATIN (LIPITOR) 10 MG TABLET    Take 1 tablet (10 mg total) by mouth daily.   CLONIDINE (CATAPRES) 0.3 MG TABLET    Take 1 tablet (0.3 mg total) by mouth 2 (two) times daily.   GABAPENTIN (NEURONTIN) 100 MG CAPSULE    Take 1 capsule (100 mg total) by mouth 2 (two) times daily. One tablet in the morning and one tablet in the afternoon   GABAPENTIN (NEURONTIN) 300 MG CAPSULE    Take 1 capsule (300 mg total) by mouth at bedtime.   GLUCOSE BLOOD TEST STRIP    1 each by Other route 3 (three) times daily. Use as instructed   HYDROCHLOROTHIAZIDE (HYDRODIURIL) 25 MG TABLET    Take 1 tablet (25 mg total) by mouth daily.   HYDROCORTISONE 2.5 % OINTMENT    Apply topically 2 (two) times daily.   IBUPROFEN (ADVIL,MOTRIN) 200 MG TABLET    Take 400 mg by mouth  as needed.   LANCETS MISC    1 each by Does not apply route 3 (three) times daily.   PSYLLIUM (METAMUCIL) 58.6 % POWDER    Take 1 packet by mouth daily.    Review of Systems  Constitutional: Negative.   Eyes: Negative.   Cardiovascular: Positive for palpitations (Reports palpitations that occur both at rest and with exertion).  Gastrointestinal: Negative.   Endocrine: Negative.   Musculoskeletal: Negative.   Skin: Negative.   Neurological: Negative.   Psychiatric/Behavioral: Negative.     Social History   Tobacco Use  . Smoking status: Never Smoker  . Smokeless tobacco: Never Used  Substance Use Topics  . Alcohol use: Not Currently   Objective:   BP (!) 149/84 (BP Location: Left Arm, Patient Position: Sitting)   Pulse (!) 50   Temp 97.7 F (36.5 C) (Oral)   Ht 5' 11.25" (1.81 m)   Wt 267 lb 3.2 oz (121.2 kg)   BMI 37.01 kg/m   Physical Exam  Constitutional: He is oriented to person, place, and time. He appears well-developed and well-nourished.  Eyes: Pupils are equal, round, and reactive to light. EOM are normal.  Neck: Normal range  of motion. Neck supple.  Cardiovascular: Normal rate, regular rhythm, normal heart sounds and intact distal pulses.  Pulmonary/Chest: Effort normal and breath sounds normal.  Abdominal: Soft. Bowel sounds are normal.  Musculoskeletal: Normal range of motion.  Neurological: He is alert and oriented to person, place, and time.  Skin: Skin is warm and dry. Capillary refill takes less than 2 seconds.  Psychiatric: He has a normal mood and affect.  Nursing note and vitals reviewed.  Assessment & Plan:   1. Heart palpitations Will refer to cardiology.  Patient advised to go to the emergency room if symptoms get worse.  2. Hypertension associated with diabetes ([place]) Moderately controlled.  Patient is mildly bradycardic on metoprolol.  Will decrease metoprolol to 25 mg twice daily and increase losartan 100 mg daily.  Continue the rest of the  blood pressure medications as scheduled  3. CKD (chronic kidney disease) stage 3, GFR 30-59 ml/min (HCC) Kidney function stable.  Continue ARB therapy and monitor kidney function closely.  Nephrology referral initiated.  Awaiting charity care approval  4. Chronic gout without tophus, unspecified cause, unspecified site Continue allopurinol for prophylaxis daily  5.  Hyperlipidemia associated with type 2 diabetes continue Lipitor daily.  Will obtain lipid panel at next visit  6 . type 2 diabetes not treated with insulin Continue metformin 500 mg daily  Deforest Hoyles, NP   [facility]

## 2018-05-20 ENCOUNTER — Ambulatory Visit (INDEPENDENT_AMBULATORY_CARE_PROVIDER_SITE_OTHER): Payer: Self-pay

## 2018-05-20 ENCOUNTER — Encounter: Payer: Self-pay | Admitting: Nurse Practitioner

## 2018-05-20 ENCOUNTER — Ambulatory Visit (INDEPENDENT_AMBULATORY_CARE_PROVIDER_SITE_OTHER): Payer: Self-pay | Admitting: Nurse Practitioner

## 2018-05-20 VITALS — BP 150/92 | HR 48 | Ht 72.0 in | Wt 269.2 lb

## 2018-05-20 DIAGNOSIS — N183 Chronic kidney disease, stage 3 unspecified: Secondary | ICD-10-CM

## 2018-05-20 DIAGNOSIS — R002 Palpitations: Secondary | ICD-10-CM

## 2018-05-20 DIAGNOSIS — E1122 Type 2 diabetes mellitus with diabetic chronic kidney disease: Secondary | ICD-10-CM

## 2018-05-20 DIAGNOSIS — I10 Essential (primary) hypertension: Secondary | ICD-10-CM

## 2018-05-20 DIAGNOSIS — E782 Mixed hyperlipidemia: Secondary | ICD-10-CM

## 2018-05-20 DIAGNOSIS — R001 Bradycardia, unspecified: Secondary | ICD-10-CM

## 2018-05-20 MED ORDER — AMLODIPINE BESYLATE 5 MG PO TABS: 5.0000 mg | ORAL_TABLET | Freq: Every day | ORAL | 3 refills | Status: DC

## 2018-05-20 NOTE — Progress Notes (Signed)
Office Visit    Patient Name: Steve Buck. Date of Encounter: 05/20/2018  Primary Care Provider:  Staci Acosta, NP Primary Cardiologist:  Kathlyn Sacramento, MD  Chief Complaint    62 year old male with a history of diabetes, stage III chronic kidney disease, difficult to control hypertension, obesity, dyspnea, and chest discomfort with negative stress test earlier this year, who presents for follow-up related to palpitations.  Past Medical History    Past Medical History:  Diagnosis Date  . (HFpEF) heart failure with preserved ejection fraction ([place])    a. 12/2017 Echo: EF 60-65%, no rwma, Gr1DD, mild MR, mildly dil LA, nl RV fxn.  . Allergy    Seasonal  . Chest pain    a. 12/2017 Lexiscan MV: EF 37% (nl by echo). Small mild, fixed region of apical thinning, likely attenuation artifact. No ischemia. Low risk.  . CKD (chronic kidney disease) stage 3, GFR 30-59 ml/min (HCC) 03/30/2018  . Diabetic nephropathy associated with type 2 diabetes mellitus ([place]) 03/25/2018  . DKA (diabetic ketoacidoses) ([place]) 12/01/2017  . Heart murmur    a. 12/2017 Echo: Mild MR.  Marland Kitchen History of Bell's palsy 03/30/2018  . Hypertension   . Morbid obesity ([place])   . Palpitations   . Type 2 diabetes mellitus, uncontrolled, with neuropathy ([place])    Past Surgical History:  Procedure Laterality Date  . FOOT SURGERY  around 1994    Allergies  Allergies  Allergen Reactions  . Lisinopril Rash and Cough    History of Present Illness    62 year old male with the above past medical history including hypertension, obesity, stage III chronic kidney disease, type 2 diabetes mellitus, and palpitations.  He has a reported history of heart failure though recent echocardiogram in April showed normal LV function with grade 1 diastolic dysfunction.  It does not appear that he is ever been admitted for heart failure.  In the spring of this year, he was hospitalized at [facility] with hyperosmolar hyperglycemic state  and acute kidney injury.  This was his first diagnosis of diabetes and has since been on medical therapy with improvement of A1c to 5.8 as of March 30, 2018 (down from 14.0 on 12/02/2017).  Prior to diagnosis of diabetes, he was having frequent palpitations but following diagnosis, he changed his lifestyle and noted improvement.  When he was seen by Dr. Fletcher Anon in clinic in April, he was no longer having palpitations and therefore, monitoring was deferred.  He did report chest discomfort and dyspnea and as result, he underwent stress testing which was nonischemic.  Echocardiogram showed normal LV function as noted above.  Since his last visit, he has had return of palpitations, occurring most days of the week, sometimes lasting hours at a time.  He describes palpitations as skipped beats which present as fluttering in his chest.  He does not have sustained tachyarrhythmias.  Though he experiences orthostatic lightheadedness with or without palpitations, palpitations by themselves and so it caused him to feel lightheaded or presyncopal.  He has not had syncope.  He has pretty good exercise tolerance, push mowing his 1 acre lawn weekly without symptoms or limitations.  He denies chest pain, dyspnea, PND, orthopnea, edema, or early satiety.  He sometimes checks his blood pressure at home and notes it is typically in the 150s.  He says is been this way since he was in his 31s.  Home Medications    Prior to Admission medications   Medication Sig Start Date End  Date Taking? Authorizing Provider  allopurinol (ZYLOPRIM) 100 MG tablet Take 1 tablet (100 mg total) by mouth daily. 01/21/18   Tukov-Yual, Arlyss Gandy, NP  aspirin EC 81 MG tablet Take 1 tablet (81 mg total) by mouth daily. 01/21/18   Tukov-Yual, Arlyss Gandy, NP  atorvastatin (LIPITOR) 10 MG tablet Take 1 tablet (10 mg total) by mouth daily. 01/21/18   Tukov-Yual, Arlyss Gandy, NP  cloNIDine (CATAPRES) 0.3 MG tablet Take 1 tablet (0.3 mg total) by mouth 2 (two)  times daily. 01/21/18   Tukov-Yual, Arlyss Gandy, NP  gabapentin (NEURONTIN) 100 MG capsule Take 1 capsule (100 mg total) by mouth 2 (two) times daily. One tablet in the morning and one tablet in the afternoon 03/25/18   Tukov-Yual, Magdalene S, NP  gabapentin (NEURONTIN) 300 MG capsule Take 1 capsule (300 mg total) by mouth at bedtime. 03/25/18   Tukov-Yual, Arlyss Gandy, NP  glucose blood test strip 1 each by Other route 3 (three) times daily. Use as instructed 01/21/18   Tukov-Yual, Arlyss Gandy, NP  hydrochlorothiazide (HYDRODIURIL) 25 MG tablet Take 1 tablet (25 mg total) by mouth daily. 01/21/18   Tukov-Yual, Arlyss Gandy, NP  hydrocortisone 2.5 % ointment Apply topically 2 (two) times daily. 03/25/18   Tukov-Yual, Arlyss Gandy, NP  ibuprofen (ADVIL,MOTRIN) 200 MG tablet Take 400 mg by mouth as needed.    [provider]  Lancets MISC 1 each by Does not apply route 3 (three) times daily. 01/21/18   Tukov-Yual, Arlyss Gandy, NP  losartan (COZAAR) 100 MG tablet Take 0.5 tablets (50 mg total) by mouth at bedtime. 05/06/18   Tukov-Yual, Arlyss Gandy, NP  metFORMIN (GLUCOPHAGE) 500 MG tablet Take 1 tablet (500 mg total) by mouth 2 (two) times daily with a meal. 05/06/18   Tukov-Yual, Arlyss Gandy, NP  metoprolol tartrate (LOPRESSOR) 25 MG tablet Take 1 tablet (25 mg total) by mouth 2 (two) times daily. 05/06/18   Tukov-Yual, Arlyss Gandy, NP  psyllium (METAMUCIL) 58.6 % powder Take 1 packet by mouth daily.    [provider]    Review of Systems    Palpitations and fluttering as outlined above.  He denies chest pain, dyspnea, PND, orthopnea, dizziness, syncope, edema, or early satiety.  He does sometimes experience orthostatic lightheadedness.  All other systems reviewed and are otherwise negative except as noted above.  Physical Exam    VS:  BP (!) 150/92 (BP Location: Right Arm, Patient Position: Sitting, Cuff Size: Normal)   Pulse (!) 48   Ht 6' (1.829 m)   Wt 269 lb 4 oz (122.1 kg)   BMI  36.52 kg/m  , BMI Body mass index is 36.52 kg/m.  Blood pressure lying 175/95, heart rate 48 Blood pressure sitting 153/91, heart rate 49 Blood pressure standing 137/82, heart rate 50 Blood pressure standing 3 minutes 144/86, heart rate 50 GEN: Well nourished, well developed, in no acute distress. HEENT: normal. Neck: Supple, no JVD, carotid bruits, or masses. Cardiac: RRR, bradycardic, no murmurs, rubs, or gallops. No clubbing, cyanosis, edema.  Radials/DP/PT 2+ and equal bilaterally.  Respiratory:  Respirations regular and unlabored, clear to auscultation bilaterally. GI: Soft, nontender, nondistended, BS + x 4. MS: no deformity or atrophy. Skin: warm and dry, no rash. Neuro:  Strength and sensation are intact. Psych: Normal affect.  Accessory Clinical Findings    ECG personally reviewed by me today -sinus bradycardia, 48, first-degree AV block, left axis deviation, left anterior fascicular block, nonspecific T changes.- no acute changes.  Lab Results  Component Value Date   CREATININE 1.90 (H) 03/30/2018   BUN 25 03/30/2018   NA 141 03/30/2018   K 4.1 03/30/2018   CL 97 03/30/2018   CO2 29 03/30/2018    Lab Results  Component Value Date   CHOL 133 03/30/2018   HDL 27 (L) 03/30/2018   LDLCALC 78 03/30/2018   TRIG 141 03/30/2018   CHOLHDL 4.9 03/30/2018     Assessment & Plan    1.  Palpitations: Patient reports a several year history of intermittent palpitations that initially improved with treatment of diabetes diagnosed earlier this year however, he has had recurrent palpitations occurring most days of the week though not necessarily every day, lasting up to hours at a time and described as a fluttering sensation and skipped beats.  He does not have any sustained tachyarrhythmias and denies presyncope associated with symptoms.  He notes that because of bradycardia, his metoprolol dose, which at one point was 200 mg daily has been reduced down to 25 mg daily.  His heart  rate is 48 today.  This comes up to 64 with ambulation.  He had labs in July which showed normal electrolytes.  I will follow-up a TSH today and plan to place a 2-week zio monitor.  2.  Essential hypertension: Patient says he has been hypertensive since his 7s.  He is currently on clonidine 0.3 mg twice daily, HCTZ 25 mg daily, losartan 100 mg daily, and metoprolol 25 mg twice daily.  I am going to add amlodipine 5 mg daily.  In the setting of bradycardia with evidence of conduction disease, I am going to hold his beta-blocker for the time being.  Pending response to amlodipine, we may wish to consider a work-up for hyperaldosteronism and also renal artery duplex.  3.  Sinus bradycardia: Patient's heart rate is 48 at rest today.  He does have a fairly wide first-degree AV block with a PR interval of 266.  Left anterior fascicular block noted.  He was previously on high-dose beta-blocker and this has been whittled down to 25 mg twice daily.  Heart rate came up into the low 60s with ambulation.  With bradycardia and can conduction disease, I am going to hold his beta-blocker.  Follow heart rates on monitoring and we can consider introduction at a lower dose.  I suspect, based on his description of palpitations, that he may be expensing PVCs.  I would wonder if perhaps these are escape beats in the setting of significant sinus bradycardia.  4.  Type 2 diabetes mellitus: Followed closely by primary care and remains on low-dose metformin.  Most recent A1c was 5.8 which was down from 14 in July.  He has lost a significant amount of weight over that period of time.  5.  Stage III chronic kidney disease: Stable on recent follow-up.  He is on ARB therapy.  6.  Hyperlipidemia: LDL 78.  Continue statin therapy.  7.  Disposition: Follow-up TSH today.  Follow-up event monitoring.  He will follow-up in clinic in approximately 1 month or sooner if necessary.  Murray Hodgkins, NP 05/20/2018, 2:22 PM

## 2018-05-20 NOTE — Patient Instructions (Addendum)
Medication Instructions: START Amlodipine 5 mg daily. STOP the Metoprolol  If you need a refill on your cardiac medications before your next appointment, please call your pharmacy.   Labwork: Your provider would like for you to have the following labs today: TSH   Procedures/Testing: Your physician has recommended that you wear a Zio 14 day event monitor. Event monitors are medical devices that record the heart's electrical activity. Doctors most often [place] these monitors to diagnose arrhythmias. Arrhythmias are problems with the speed or rhythm of the heartbeat. The monitor is a small, portable device. You can wear one while you do your normal daily activities. This is usually used to diagnose what is causing palpitations/syncope (passing out).   Follow-Up: Your physician wants you to follow-up in one month with Dr. Fletcher Anon or Murray Hodgkins, NP.    Thank you for choosing [facility] at [facility]!

## 2018-05-21 LAB — TSH: TSH: 2.17 u[IU]/mL (ref 0.450–4.500)

## 2018-06-09 ENCOUNTER — Telehealth: Payer: Self-pay | Admitting: *Deleted

## 2018-06-09 MED ORDER — METOPROLOL TARTRATE 25 MG PO TABS: 12.5000 mg | ORAL_TABLET | Freq: Two times a day (BID) | ORAL | 3 refills | Status: DC

## 2018-06-09 NOTE — Telephone Encounter (Signed)
-----   Message from Theora Gianotti, NP sent at 06/09/2018  9:28 AM EDT ----- His monitor shows frequent extra beats from the bottom portion of his heart.  These are the very likely culprit for his palpitations and are generally benign.  He does not have so many PVC's that we'd need to place him on a stronger medication at this point, but he should resume metoprolol at 12.5mg  bid (prev on 25 bid and I asked him to hold it at last office visit).

## 2018-06-09 NOTE — Telephone Encounter (Signed)
Results called to pt. Pt verbalized understanding. He has some metoprolol tartrate 25 mg tablets at home. He is aware to take 1/2 tablet by mouth two times a day. He is aware of upcoming appointment date and time as well.

## 2018-06-23 ENCOUNTER — Ambulatory Visit (INDEPENDENT_AMBULATORY_CARE_PROVIDER_SITE_OTHER): Payer: Self-pay | Admitting: Nurse Practitioner

## 2018-06-23 ENCOUNTER — Encounter: Payer: Self-pay | Admitting: Nurse Practitioner

## 2018-06-23 VITALS — BP 146/76 | HR 51 | Ht 72.0 in | Wt 269.8 lb

## 2018-06-23 DIAGNOSIS — R001 Bradycardia, unspecified: Secondary | ICD-10-CM

## 2018-06-23 DIAGNOSIS — N183 Chronic kidney disease, stage 3 unspecified: Secondary | ICD-10-CM

## 2018-06-23 DIAGNOSIS — I493 Ventricular premature depolarization: Secondary | ICD-10-CM

## 2018-06-23 DIAGNOSIS — E782 Mixed hyperlipidemia: Secondary | ICD-10-CM

## 2018-06-23 DIAGNOSIS — I10 Essential (primary) hypertension: Secondary | ICD-10-CM

## 2018-06-23 MED ORDER — METOPROLOL TARTRATE 25 MG PO TABS: 25.0000 mg | ORAL_TABLET | Freq: Two times a day (BID) | ORAL | Status: DC

## 2018-06-23 NOTE — Progress Notes (Signed)
Office Visit    Patient Name: Steve Buck. Date of Encounter: 06/23/2018  Primary Care Provider:  Staci Acosta, NP Primary Cardiologist:  Kathlyn Sacramento, MD  Chief Complaint    62 year old male with history of diabetes, stage III chronic kidney disease, difficult to control hypertension, obesity, dyspnea, and chest discomfort with negative stress test in April 2019, who presents for follow-up related to palpitations and PVCs noted on recent monitoring.  Past Medical History    Past Medical History:  Diagnosis Date  . (HFpEF) heart failure with preserved ejection fraction ([place])    a. 12/2017 Echo: EF 60-65%, no rwma, Gr1DD, mild MR, mildly dil LA, nl RV fxn.  . Allergy    Seasonal  . Chest pain    a. 12/2017 Lexiscan MV: EF 37% (nl by echo). Small mild, fixed region of apical thinning, likely attenuation artifact. No ischemia. Low risk.  . CKD (chronic kidney disease) stage 3, GFR 30-59 ml/min (HCC) 03/30/2018  . Diabetic nephropathy associated with type 2 diabetes mellitus ([place]) 03/25/2018  . DKA (diabetic ketoacidoses) ([place]) 12/01/2017  . Heart murmur    a. 12/2017 Echo: Mild MR.  Marland Kitchen History of Bell's palsy 03/30/2018  . Hypertension   . Morbid obesity ([place])   . Palpitations   . PVC's (premature ventricular contractions)    a. 05/2018 Holter: RSR, avg rate of 60. Freq PVC's (6.4% burden). Some ventricular bigeminy/trigeminy.  . Type 2 diabetes mellitus, uncontrolled, with neuropathy ([place])    Past Surgical History:  Procedure Laterality Date  . FOOT SURGERY  around 1994    Allergies  Allergies  Allergen Reactions  . Lisinopril Rash and Cough    History of Present Illness    62 year old male with the above past medical history including hypertension, obesity, stage III chronic kidney disease, diabetes, and palpitations.  He has a reported history of heart failure though recent echocardiogram in April 2019 showed normal LV function with grade 1 diastolic  dysfunction.  It does not appear that he has ever been admitted for heart failure.  In the spring 2019, he was hospitalized at [facility] with hyperosmolar hyperglycemic state and acute kidney injury.  This was his initial diagnosis of diabetes and has since been on medical therapy with improvement of A1c to 5.8 as of July 2019 (down from 14 in March 2019).  Following diabetes diagnosis, he underwent stress testing in the setting of chest pain, which was low risk.  Echocardiogram showed normal LV function.  I saw him in clinic in early September at which time he reported frequent palpitations, skipped beats, and fluttering in his chest.  His heart rate was 48 that day on 25 mg of metoprolol twice daily.  I asked him to hold metoprolol and placed a 48-hour Holter monitor which showed frequent PVCs accounting for 6.4% of total beats with occasional bigeminy and trigeminy.  We had him resume metoprolol 12.5 mg twice daily.  Since then, he has continued to note intermittent palpitations and skipped beats, sometimes occurring every second or third beat.  He has had occasional, focal, sharp and fleeting left-sided chest discomfort, usually occurring at rest, and resolving within a second or so.  He has never had exertional chest pain or dyspnea but says that experiencing  palpitations worries him and as result, he has not been particularly active or exercising.  He says that he was not able to cut the 25 mg metoprolol in half and as result he is been taking 25  mg at nighttime only.  Therefore, he is going most of the day without metoprolol in his system.  He denies PND, orthopnea, dizziness, syncope, edema, or early satiety.  Home Medications    Prior to Admission medications   Medication Sig Start Date End Date Taking? Authorizing Provider  allopurinol (ZYLOPRIM) 100 MG tablet Take 1 tablet (100 mg total) by mouth daily. 01/21/18   Tukov-Yual, Arlyss Gandy, NP  amLODipine (NORVASC) 5 MG tablet Take 1 tablet (5 mg total)  by mouth daily. 05/20/18 08/18/18  Theora Gianotti, NP  aspirin EC 81 MG tablet Take 1 tablet (81 mg total) by mouth daily. 01/21/18   Tukov-Yual, Arlyss Gandy, NP  atorvastatin (LIPITOR) 10 MG tablet Take 1 tablet (10 mg total) by mouth daily. 01/21/18   Tukov-Yual, Arlyss Gandy, NP  cloNIDine (CATAPRES) 0.3 MG tablet Take 1 tablet (0.3 mg total) by mouth 2 (two) times daily. 01/21/18   Tukov-Yual, Arlyss Gandy, NP  gabapentin (NEURONTIN) 100 MG capsule Take 1 capsule (100 mg total) by mouth 2 (two) times daily. One tablet in the morning and one tablet in the afternoon 03/25/18   Tukov-Yual, Magdalene S, NP  gabapentin (NEURONTIN) 300 MG capsule Take 1 capsule (300 mg total) by mouth at bedtime. 03/25/18   Tukov-Yual, Arlyss Gandy, NP  glucose blood test strip 1 each by Other route 3 (three) times daily. Use as instructed 01/21/18   Tukov-Yual, Arlyss Gandy, NP  hydrochlorothiazide (HYDRODIURIL) 25 MG tablet Take 1 tablet (25 mg total) by mouth daily. 01/21/18   Tukov-Yual, Arlyss Gandy, NP  hydrocortisone 2.5 % ointment Apply topically 2 (two) times daily. 03/25/18   Tukov-Yual, Arlyss Gandy, NP  ibuprofen (ADVIL,MOTRIN) 200 MG tablet Take 400 mg by mouth as needed.    [provider]  Lancets MISC 1 each by Does not apply route 3 (three) times daily. 01/21/18   Tukov-Yual, Arlyss Gandy, NP  losartan (COZAAR) 100 MG tablet Take 0.5 tablets (50 mg total) by mouth at bedtime. 05/06/18   Tukov-Yual, Arlyss Gandy, NP  metFORMIN (GLUCOPHAGE) 500 MG tablet Take 1 tablet (500 mg total) by mouth 2 (two) times daily with a meal. 05/06/18   Tukov-Yual, Arlyss Gandy, NP  metoprolol tartrate (LOPRESSOR) 25 MG tablet Take 0.5 tablets (12.5 mg total) by mouth 2 (two) times daily. 06/09/18 09/07/18  Theora Gianotti, NP  psyllium (METAMUCIL) 58.6 % powder Take 1 packet by mouth daily.    [provider]    Review of Systems    Palpitations in the experience of skipped beats as outlined above.  These are  bothersome for him and cause some anxiety.  He also notes that he has had some depression and plans to follow-up with primary care.  He occasionally has had very fleeting episodes of sharper pinprick like focal left-sided chest discomfort lasting just a second and resolving spontaneously.  He has never had exertional symptoms and is able to mow his lawn without difficulty.  He denies dyspnea, PND, orthopnea, dizziness, syncope, edema, or early satiety.  All other systems reviewed and are otherwise negative except as noted above.  Physical Exam    VS:  BP (!) 146/76 (BP Location: Left Arm, Patient Position: Sitting, Cuff Size: Normal)   Pulse (!) 51   Ht 6' (1.829 m)   Wt 269 lb 12 oz (122.4 kg)   BMI 36.58 kg/m  , BMI Body mass index is 36.58 kg/m. GEN: Well nourished, well developed, in no acute distress. HEENT: normal. Neck:  Supple, no JVD, carotid bruits, or masses. Cardiac: RRR, no murmurs, rubs, or gallops. No clubbing, cyanosis, edema.  Radials/DP/PT 2+ and equal bilaterally.  Respiratory:  Respirations regular and unlabored, clear to auscultation bilaterally. GI: Soft, nontender, nondistended, BS + x 4. MS: no deformity or atrophy. Skin: warm and dry, no rash. Neuro:  Strength and sensation are intact. Psych: Normal affect.  Accessory Clinical Findings    ECG personally reviewed by me today -sinus bradycardia with first-degree AV block and PVCs, 51.  Nonspecific T changes - no acute changes.  Lab Results  Component Value Date   CREATININE 1.90 (H) 03/30/2018   BUN 25 03/30/2018   NA 141 03/30/2018   K 4.1 03/30/2018   CL 97 03/30/2018   CO2 29 03/30/2018    Lab Results  Component Value Date   TSH 2.170 05/20/2018     Assessment & Plan    1.  Symptomatic PVCs: Patient was recently evaluated and reported frequent palpitations and skipped beats.  Holter monitoring showed frequent PVCs along with bigeminy and trigeminy accounting for 6.4% of total beats.  He was  bradycardic at his last visit and metoprolol was reduced to 12.5 mg twice daily however, he has not steadily been taking 25 mg of short acting metoprolol nightly, as he was not able to cut the tabs in half.  When he was here last, his heart rates were in the high 40s on twice daily metoprolol though he did show appropriate chronotropic response with ambulation.  He will begin taking metoprolol 25 twice daily again.  Given that he is symptomatic and that further titration of AVN blocking agents is quite limited by bradycardia, I will arrange for follow-up with Dr. Caryl Comes to consider antiarrhythmic versus ablative therapies.  2.  Essential hypertension: Blood pressure moderately elevated today.  Amlodipine added at last visit and overall, pressure does look better.  He will be adding back twice daily metoprolol as he is only been taking this at bedtime.  Otherwise he remains on clonidine and losartan.  3.  Sinus bradycardia: First-degree AV block also noted.  Rate stable today.  Patient will continue to monitor heart rates at home in the setting of a higher dose of beta-blocker and report back to [place] if he develops symptoms or more profound bradycardia.  4.  Type 2 diabetes mellitus: Followed closely by primary care and he remains on low-dose metformin.  A1c 5.8 earlier this year.  5.  Stage III chronic kidney disease: Creatinine was stable in July at 1.9.  He remains on ARB therapy.  6.  Hyperlipidemia: LDL 78.  Continue statin.  7.  Disposition: Patient will follow-up with electrophysiology in approximately 2 weeks.  Murray Hodgkins, NP 06/23/2018, 5:49 PM

## 2018-06-23 NOTE — Patient Instructions (Addendum)
Medication Instructions:  - Your physician has recommended you make the following change in your medication:   1) INCREASE lopressor (metoprolol tartrate) 25 mg- take 1 tablet by mouth TWICE daily  If you need a refill on your cardiac medications before your next appointment, please call your pharmacy.   Lab work: - none ordered  If you have labs (blood work) drawn today and your tests are completely normal, you will receive your results only by: Marland Kitchen MyChart Message (if you have MyChart) OR . A paper copy in the mail If you have any lab test that is abnormal or we need to change your treatment, we will call you to review the results.  Testing/Procedures: - none ordered  Follow-Up: At [facility], you and your health needs are our priority.  As part of our continuing mission to provide you with exceptional heart care, we have created designated Provider Care Teams.  These Care Teams include your primary Cardiologist (physician) and Advanced Practice Providers (APPs -  Physician Assistants and Nurse Practitioners) who all work together to provide you with the care you need, when you need it.  - You have been referred to : Dr. Virl Axe- for PVC's   Any Other Special Instructions Will Be Listed Below (If Applicable). - n/a

## 2018-07-01 ENCOUNTER — Encounter: Payer: Self-pay | Admitting: Internal Medicine

## 2018-07-01 ENCOUNTER — Ambulatory Visit (INDEPENDENT_AMBULATORY_CARE_PROVIDER_SITE_OTHER): Payer: Self-pay | Admitting: Internal Medicine

## 2018-07-01 VITALS — BP 170/90 | HR 64 | Ht 72.0 in | Wt 270.5 lb

## 2018-07-01 DIAGNOSIS — I493 Ventricular premature depolarization: Secondary | ICD-10-CM

## 2018-07-01 DIAGNOSIS — R079 Chest pain, unspecified: Secondary | ICD-10-CM

## 2018-07-01 NOTE — Patient Instructions (Addendum)
Medication Instructions:  - Your physician recommends that you continue on your current medications as directed. Please refer to the Current Medication list given to you today.  If you need a refill on your cardiac medications before your next appointment, please call your pharmacy.   Lab work: - We will need to obtain lab work prior to your Cardiac CTA- I will watch for when this is scheduled and be in touch with you to arrange- BMP  If you have labs (blood work) drawn today and your tests are completely normal, you will receive your results only by: Marland Kitchen MyChart Message (if you have MyChart) OR . A paper copy in the mail If you have any lab test that is abnormal or we need to change your treatment, we will call you to review the results.  Testing/Procedures: - Your physician has recommended that you have a loop recorder (LINQ monitor) placed.  - Please arrive at the [facility] entrance at 7:30 am on Thursday 07/08/18 - You may have a light breakfast the morning of your procedure - You may take all of your regular medications the morning of your procedure - You may drive to and from the procedure - Please wash your chest and neck area with an antibacterial soap the night before and morning of your procedure   - Your physician has requested that you have cardiac CT. Cardiac computed tomography (CT) is a painless test that uses an x-ray machine to take clear, detailed pictures of your heart. For further information please visit [employer]tn. Please follow instruction sheet as given.   Please arrive at the [facility] main entrance of [facility] at ________________ AM (30-45 minutes prior to test start time)  [facility] [address] [place], [place] [address] [phone]  Proceed to the [facility] Radiology Department (First Floor).  Please follow these instructions carefully (unless otherwise directed):  Hold all erectile dysfunction  medications at least 48 hours prior to test.  On the Night Before the Test: . Be sure to Drink plenty of water. . Do not consume any caffeinated/decaffeinated beverages or chocolate 12 hours prior to your test. . Do not take any antihistamines 12 hours prior to your test. . If you take Metformin do not take 24 hours prior to test.  On the Day of the Test: . Drink plenty of water. Do not drink any water within one hour of the test. . Do not eat any food 4 hours prior to the test. . You may take your regular medications prior to the test-  . Take metoprolol (Lopressor) 100 mg two hours prior to test. . HOLD Furosemide/Hydrochlorothiazide morning of the test.  After the Test: . Drink plenty of water. . After receiving IV contrast, you may experience a mild flushed feeling. This is normal. . On occasion, you may experience a mild rash up to 24 hours after the test. This is not dangerous. If this occurs, you can take Benadryl 25 mg and increase your fluid intake. . If you experience trouble breathing, this can be serious. If it is severe call 911 IMMEDIATELY. If it is mild, please call our office. . If you take any of these medications: Glipizide/Metformin, Avandament, Glucavance, please do not take 48 hours after completing test.   Follow-Up: At [facility], you and your health needs are our priority.  As part of our continuing mission to provide you with exceptional heart care, we have created designated Provider Care Teams.  These Care Teams include your primary Cardiologist (physician) and Advanced Practice Providers (APPs -  Physician Assistants and Nurse Practitioners) who all work together to provide you with the care you need, when you need it. . In 10-14 days (from 07/08/18) for a wound check with the Device Nurse  . Pending the results of your CT scan- with Dr. Caryl Comes  Any Other Special Instructions Will Be Listed Below (If Applicable). - N/A

## 2018-07-01 NOTE — Progress Notes (Signed)
ELECTROPHYSIOLOGY CONSULT NOTE  Patient ID: Steve Ells., MRN: [id number], DOB/AGE: 1956/05/19 62 y.o. Admit date: (Not on file) Date of Consult: 07/01/2018  Primary Physician: Staci Acosta, NP Primary Cardiologist: MA     Steve Bathe Khalon Cansler. is a 62 y.o. male who is being seen today for the evaluation of PVCs at the request of Dr. Gaylyn Cheers.    HPI Steve Oswin Griffith. is a 62 y.o. male referred for management of PVCs and palpitations.  These are not related to caffeine.  Increasingly problematic over recent years.   Medical history is notable for diabetes with a recent hospitalization for hyperosmotic nonketotic coma, hypertension since then he has lost 40 pounds.  Dyspnea with exertion is markedly better but still limiting.  No edema.  No exertional chest discomfort but vague nondescript chest pains  He has been assured of the benign nature of his PVCs; however, they continue to drive him crazy.  No awareness of his atrial fibrillation.   DATE TEST EF   4/19 Echo   55-65 %   4/19 Myoview   37 % No Ischemia         Date PVCs  9/19 6.4   He has had bradycardia limiting up titration of beta-blockers  Thromboembolic risk factors ( age -18, HTN-1, DM-1,) for a CHADSVASc Score of about 3     Past Medical History:  Diagnosis Date  . (HFpEF) heart failure with preserved ejection fraction ([place])    a. 12/2017 Echo: EF 60-65%, no rwma, Gr1DD, mild MR, mildly dil LA, nl RV fxn.  . Allergy    Seasonal  . Chest pain    a. 12/2017 Lexiscan MV: EF 37% (nl by echo). Small mild, fixed region of apical thinning, likely attenuation artifact. No ischemia. Low risk.  . CKD (chronic kidney disease) stage 3, GFR 30-59 ml/min (HCC) 03/30/2018  . Diabetic nephropathy associated with type 2 diabetes mellitus ([place]) 03/25/2018  . DKA (diabetic ketoacidoses) ([place]) 12/01/2017  . Heart murmur    a. 12/2017 Echo: Mild MR.  Marland Kitchen History of Bell's palsy 03/30/2018  . Hypertension   .  Morbid obesity ([place])   . Palpitations   . PVC's (premature ventricular contractions)    a. 05/2018 Holter: RSR, avg rate of 60. Freq PVC's (6.4% burden). Some ventricular bigeminy/trigeminy.  . Type 2 diabetes mellitus, uncontrolled, with neuropathy [facility])       Surgical History:  Past Surgical History:  Procedure Laterality Date  . FOOT SURGERY  around [place]: Current Meds  Medication Sig  . allopurinol (ZYLOPRIM) 100 MG tablet Take 1 tablet (100 mg total) by mouth daily.  Marland Kitchen amLODipine (NORVASC) 5 MG tablet Take 1 tablet (5 mg total) by mouth daily.  Marland Kitchen aspirin EC 81 MG tablet Take 1 tablet (81 mg total) by mouth daily.  Marland Kitchen atorvastatin (LIPITOR) 10 MG tablet Take 1 tablet (10 mg total) by mouth daily.  . cloNIDine (CATAPRES) 0.3 MG tablet Take 1 tablet (0.3 mg total) by mouth 2 (two) times daily.  Marland Kitchen gabapentin (NEURONTIN) 100 MG capsule Take 1 capsule (100 mg total) by mouth 2 (two) times daily. One tablet in the morning and one tablet in the afternoon  . gabapentin (NEURONTIN) 300 MG capsule Take 1 capsule (300 mg total) by mouth at bedtime.  Marland Kitchen glucose blood test strip 1 each by Other route 3 (three) times daily. Use as instructed  . hydrochlorothiazide (HYDRODIURIL) 25 MG  tablet Take 1 tablet (25 mg total) by mouth daily.  . hydrocortisone 2.5 % ointment Apply topically 2 (two) times daily.  Marland Kitchen ibuprofen (ADVIL,MOTRIN) 200 MG tablet Take 400 mg by mouth as needed.  . Lancets MISC 1 each by Does not apply route 3 (three) times daily.  Marland Kitchen losartan (COZAAR) 100 MG tablet Take 0.5 tablets (50 mg total) by mouth at bedtime.  . metFORMIN (GLUCOPHAGE) 500 MG tablet Take 1 tablet (500 mg total) by mouth 2 (two) times daily with a meal.  . metoprolol tartrate (LOPRESSOR) 25 MG tablet Take 1 tablet (25 mg total) by mouth 2 (two) times daily.  . psyllium (METAMUCIL) 58.6 % powder Take 1 packet by mouth daily.    Allergies:  Allergies  Allergen Reactions  . Lisinopril Rash and  Cough    Social History   Socioeconomic History  . Marital status: Married    Spouse name: Not on file  . Number of children: 2  . Years of education: Not on file  . Highest education level: Not on file  Occupational History  . Occupation: retired  Scientific laboratory technician  . Financial resource strain: Somewhat hard  . Food insecurity:    Worry: Never true    Inability: Never true  . Transportation needs:    Medical: No    Non-medical: No  Tobacco Use  . Smoking status: Never Smoker  . Smokeless tobacco: Never Used  Substance and Sexual Activity  . Alcohol use: Not Currently  . 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 Sexual activity: Not on file  Lifestyle  . Physical activity:    Days per week: 5 days    Minutes per session: 20 min  . Stress: To some extent  Relationships  . Social connections:    Talks on phone: Three times a week    Gets together: Twice a week    Attends religious service: More than 4 times per year    Active member of club or organization: No    Attends meetings of clubs or organizations: Never    Relationship status: Married  . Intimate partner violence:    Fear of current or ex partner: No    Emotionally abused: No    Physically abused: No    Forced sexual activity: No  Other Topics Concern  . Not on file  Social History Narrative   Pt said that everything was going well.    Said the main stress for him is paying for medical care, but things are going well here.   No alcohol use noted.  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     Family History  Problem Relation Age of Onset  . Cancer Mother        Skin  . CAD Father   . Arthritis Father      ROS:  Please see the history of present illness.     All other systems reviewed and negative.    Physical Exam: Blood pressure (!) 170/90, pulse 64, height 6' (1.829 m), weight 270 lb 8 oz (122.7 kg). General: Well developed, well nourished male in no acute  distress. Head: Normocephalic, atraumatic, sclera non-icteric, no xanthomas, nares are without discharge. EENT: normal  Lymph Nodes:  none Neck: Negative for carotid bruits. JVD not elevated. Back:without scoliosis kyphosis Lungs: Clear bilaterally to auscultation without wheezes, rales, or rhonchi. Breathing is unlabored. Heart: RRR with S1 S2. No  murmur . No rubs, or gallops appreciated. Abdomen: Soft, non-tender, non-distended with normoactive bowel sounds. No hepatomegaly. No rebound/guarding. No obvious abdominal masses. Msk:  Strength and tone appear normal for age. Extremities: No clubbing or cyanosis. No  edema.  Distal pedal pulses are 2+ and equal bilaterally. Skin: Warm and Dry Neuro: Alert and oriented X 3. CN III-XII intact Grossly normal sensory and motor function . Psych:  Responds to questions appropriately with a normal affect.      Labs: Cardiac Enzymes No results for input(s): CKTOTAL, CKMB, TROPONINI in the last 72 hours. CBC Lab Results  Component Value Date   WBC 7.9 03/30/2018   HGB 12.4 (L) 03/30/2018   HCT 38.1 03/30/2018   MCV 94 03/30/2018   PLT 279 03/30/2018   PROTIME: No results for input(s): LABPROT, INR in the last 72 hours. Chemistry No results for input(s): NA, K, CL, CO2, BUN, CREATININE, CALCIUM, PROT, BILITOT, ALKPHOS, ALT, AST, GLUCOSE in the last 168 hours.  Invalid input(s): LABALBU Lipids Lab Results  Component Value Date   CHOL 133 03/30/2018   HDL 27 (L) 03/30/2018   LDLCALC 78 03/30/2018   TRIG 141 03/30/2018   BNP No results found for: PROBNP Thyroid Function Tests: No results for input(s): TSH, T4TOTAL, T3FREE, THYROIDAB in the last 72 hours.  Invalid input(s): FREET3 Miscellaneous No results found for: DDIMER  Radiology/Studies:  No results found.  EKG: Sinus rhythm at 64 Intervals 23/10/40 PVCs right bundle inferior axis  ECG 3/19 reviewed atrial fibrillation with variable conduction Assessment and Plan:  PVCs  left bundle inferior axis with a QS in lead aVR and R wave in lead aVL-delayed intrinsicoid deflection  Hypertension  Atrial fibrillation-secondary  Obesity  Anxiety   The patient has frequent PVCs.  Over a 0 monitor it was about 6.4% and interestingly had a very unusual time frame, coming on about 3:00 in the morning and then abating about midday.  The intrinsicoid deflection suggests a nonendocardial focus and not withstanding the left bundle inferior axis, the R wave in aVL suggest that is not coming from the [place]  Hence, would have suggested that we consider pharmacological therapy initially.  Given his high pretest probability for coronary artery disease and not withstanding his Myoview, I recommended CTA prior to initiation of antiarrhythmic therapy; I would start with flecainide.  He also has atrial fibrillation.  This is secondary, occurring in the context of his diabetic coma.  Recent paper from the BMJ from the [place] study identified 40,000 patients with atrial fibrillation that was secondary.  It demonstrated that there risk of thromboembolic events was comparable to patients without secondary atrial fibrillation.  The [place]ham study also showed a similar risk.  Interventional strategies have not been tested i.e. anticoagulation but I presume it would be similar.  We discussed this.  We have elected to pursue an implantable loop recorder to look for the presence of atrial fibrillation as a trigger to initiate anticoagulation. More than 50% of 80 min was spent in counseling related to the above   Steve Buck

## 2018-07-01 NOTE — H&P (View-Only) (Signed)
ELECTROPHYSIOLOGY CONSULT NOTE  Patient ID: Steve Buck., MRN: [id number], DOB/AGE: 05-24-1956 62 y.o. Admit date: (Not on file) Date of Consult: 07/01/2018  Primary Physician: Staci Acosta, NP Primary Cardiologist: MA     Ralene Bathe Kalif Kattner. is a 62 y.o. male who is being seen today for the evaluation of PVCs at the request of Dr. Gaylyn Cheers.    HPI Steve Buck. is a 62 y.o. male referred for management of PVCs and palpitations.  These are not related to caffeine.  Increasingly problematic over recent years.   Medical history is notable for diabetes with a recent hospitalization for hyperosmotic nonketotic coma, hypertension since then he has lost 40 pounds.  Dyspnea with exertion is markedly better but still limiting.  No edema.  No exertional chest discomfort but vague nondescript chest pains  He has been assured of the benign nature of his PVCs; however, they continue to drive him crazy.  No awareness of his atrial fibrillation.   DATE TEST EF   4/19 Echo   55-65 %   4/19 Myoview   37 % No Ischemia         Date PVCs  9/19 6.4   He has had bradycardia limiting up titration of beta-blockers  Thromboembolic risk factors ( age -43, HTN-1, DM-1,) for a CHADSVASc Score of about 3     Past Medical History:  Diagnosis Date  . (HFpEF) heart failure with preserved ejection fraction ([place])    a. 12/2017 Echo: EF 60-65%, no rwma, Gr1DD, mild MR, mildly dil LA, nl RV fxn.  . Allergy    Seasonal  . Chest pain    a. 12/2017 Lexiscan MV: EF 37% (nl by echo). Small mild, fixed region of apical thinning, likely attenuation artifact. No ischemia. Low risk.  . CKD (chronic kidney disease) stage 3, GFR 30-59 ml/min (HCC) 03/30/2018  . Diabetic nephropathy associated with type 2 diabetes mellitus ([place]) 03/25/2018  . DKA (diabetic ketoacidoses) ([place]) 12/01/2017  . Heart murmur    a. 12/2017 Echo: Mild MR.  Marland Kitchen History of Bell's palsy 03/30/2018  . Hypertension   .  Morbid obesity ([place])   . Palpitations   . PVC's (premature ventricular contractions)    a. 05/2018 Holter: RSR, avg rate of 60. Freq PVC's (6.4% burden). Some ventricular bigeminy/trigeminy.  . Type 2 diabetes mellitus, uncontrolled, with neuropathy [facility])       Surgical History:  Past Surgical History:  Procedure Laterality Date  . FOOT SURGERY  around [place]: Current Meds  Medication Sig  . allopurinol (ZYLOPRIM) 100 MG tablet Take 1 tablet (100 mg total) by mouth daily.  Marland Kitchen amLODipine (NORVASC) 5 MG tablet Take 1 tablet (5 mg total) by mouth daily.  Marland Kitchen aspirin EC 81 MG tablet Take 1 tablet (81 mg total) by mouth daily.  Marland Kitchen atorvastatin (LIPITOR) 10 MG tablet Take 1 tablet (10 mg total) by mouth daily.  . cloNIDine (CATAPRES) 0.3 MG tablet Take 1 tablet (0.3 mg total) by mouth 2 (two) times daily.  Marland Kitchen gabapentin (NEURONTIN) 100 MG capsule Take 1 capsule (100 mg total) by mouth 2 (two) times daily. One tablet in the morning and one tablet in the afternoon  . gabapentin (NEURONTIN) 300 MG capsule Take 1 capsule (300 mg total) by mouth at bedtime.  Marland Kitchen glucose blood test strip 1 each by Other route 3 (three) times daily. Use as instructed  . hydrochlorothiazide (HYDRODIURIL) 25 MG  tablet Take 1 tablet (25 mg total) by mouth daily.  . hydrocortisone 2.5 % ointment Apply topically 2 (two) times daily.  Marland Kitchen ibuprofen (ADVIL,MOTRIN) 200 MG tablet Take 400 mg by mouth as needed.  . Lancets MISC 1 each by Does not apply route 3 (three) times daily.  Marland Kitchen losartan (COZAAR) 100 MG tablet Take 0.5 tablets (50 mg total) by mouth at bedtime.  . metFORMIN (GLUCOPHAGE) 500 MG tablet Take 1 tablet (500 mg total) by mouth 2 (two) times daily with a meal.  . metoprolol tartrate (LOPRESSOR) 25 MG tablet Take 1 tablet (25 mg total) by mouth 2 (two) times daily.  . psyllium (METAMUCIL) 58.6 % powder Take 1 packet by mouth daily.    Allergies:  Allergies  Allergen Reactions  . Lisinopril Rash and  Cough    Social History   Socioeconomic History  . Marital status: Married    Spouse name: Not on file  . Number of children: 2  . Years of education: Not on file  . Highest education level: Not on file  Occupational History  . Occupation: retired  Scientific laboratory technician  . Financial resource strain: Somewhat hard  . Food insecurity:    Worry: Never true    Inability: Never true  . Transportation needs:    Medical: No    Non-medical: No  Tobacco Use  . Smoking status: Never Smoker  . Smokeless tobacco: Never Used  Substance and Sexual Activity  . Alcohol use: Not Currently  . 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 Sexual activity: Not on file  Lifestyle  . Physical activity:    Days per week: 5 days    Minutes per session: 20 min  . Stress: To some extent  Relationships  . Social connections:    Talks on phone: Three times a week    Gets together: Twice a week    Attends religious service: More than 4 times per year    Active member of club or organization: No    Attends meetings of clubs or organizations: Never    Relationship status: Married  . Intimate partner violence:    Fear of current or ex partner: No    Emotionally abused: No    Physically abused: No    Forced sexual activity: No  Other Topics Concern  . Not on file  Social History Narrative   Pt said that everything was going well.    Said the main stress for him is paying for medical care, but things are going well here.   No alcohol use noted.  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     Family History  Problem Relation Age of Onset  . Cancer Mother        Skin  . CAD Father   . Arthritis Father      ROS:  Please see the history of present illness.     All other systems reviewed and negative.    Physical Exam: Blood pressure (!) 170/90, pulse 64, height 6' (1.829 m), weight 270 lb 8 oz (122.7 kg). General: Well developed, well nourished male in no acute  distress. Head: Normocephalic, atraumatic, sclera non-icteric, no xanthomas, nares are without discharge. EENT: normal  Lymph Nodes:  none Neck: Negative for carotid bruits. JVD not elevated. Back:without scoliosis kyphosis Lungs: Clear bilaterally to auscultation without wheezes, rales, or rhonchi. Breathing is unlabored. Heart: RRR with S1 S2. No  murmur . No rubs, or gallops appreciated. Abdomen: Soft, non-tender, non-distended with normoactive bowel sounds. No hepatomegaly. No rebound/guarding. No obvious abdominal masses. Msk:  Strength and tone appear normal for age. Extremities: No clubbing or cyanosis. No  edema.  Distal pedal pulses are 2+ and equal bilaterally. Skin: Warm and Dry Neuro: Alert and oriented X 3. CN III-XII intact Grossly normal sensory and motor function . Psych:  Responds to questions appropriately with a normal affect.      Labs: Cardiac Enzymes No results for input(s): CKTOTAL, CKMB, TROPONINI in the last 72 hours. CBC Lab Results  Component Value Date   WBC 7.9 03/30/2018   HGB 12.4 (L) 03/30/2018   HCT 38.1 03/30/2018   MCV 94 03/30/2018   PLT 279 03/30/2018   PROTIME: No results for input(s): LABPROT, INR in the last 72 hours. Chemistry No results for input(s): NA, K, CL, CO2, BUN, CREATININE, CALCIUM, PROT, BILITOT, ALKPHOS, ALT, AST, GLUCOSE in the last 168 hours.  Invalid input(s): LABALBU Lipids Lab Results  Component Value Date   CHOL 133 03/30/2018   HDL 27 (L) 03/30/2018   LDLCALC 78 03/30/2018   TRIG 141 03/30/2018   BNP No results found for: PROBNP Thyroid Function Tests: No results for input(s): TSH, T4TOTAL, T3FREE, THYROIDAB in the last 72 hours.  Invalid input(s): FREET3 Miscellaneous No results found for: DDIMER  Radiology/Studies:  No results found.  EKG: Sinus rhythm at 64 Intervals 23/10/40 PVCs right bundle inferior axis  ECG 3/19 reviewed atrial fibrillation with variable conduction Assessment and Plan:  PVCs  left bundle inferior axis with a QS in lead aVR and R wave in lead aVL-delayed intrinsicoid deflection  Hypertension  Atrial fibrillation-secondary  Obesity  Anxiety   The patient has frequent PVCs.  Over a 0 monitor it was about 6.4% and interestingly had a very unusual time frame, coming on about 3:00 in the morning and then abating about midday.  The intrinsicoid deflection suggests a nonendocardial focus and not withstanding the left bundle inferior axis, the R wave in aVL suggest that is not coming from the [place]  Hence, would have suggested that we consider pharmacological therapy initially.  Given his high pretest probability for coronary artery disease and not withstanding his Myoview, I recommended CTA prior to initiation of antiarrhythmic therapy; I would start with flecainide.  He also has atrial fibrillation.  This is secondary, occurring in the context of his diabetic coma.  Recent paper from the BMJ from the [place] study identified 40,000 patients with atrial fibrillation that was secondary.  It demonstrated that there risk of thromboembolic events was comparable to patients without secondary atrial fibrillation.  The [place] study also showed a similar risk.  Interventional strategies have not been tested i.e. anticoagulation but I presume it would be similar.  We discussed this.  We have elected to pursue an implantable loop recorder to look for the presence of atrial fibrillation as a trigger to initiate anticoagulation. More than 50% of 80 min was spent in counseling related to the above   Virl Axe

## 2018-07-08 ENCOUNTER — Ambulatory Visit
Admission: RE | Admit: 2018-07-08 | Discharge: 2018-07-08 | Disposition: A | Discharge status: Home / Self Care | Payer: Self-pay | Source: Ambulatory Visit | Attending: Internal Medicine | Admitting: Internal Medicine

## 2018-07-08 ENCOUNTER — Ambulatory Visit: Payer: Self-pay | Admitting: Internal Medicine

## 2018-07-08 ENCOUNTER — Encounter
Admission: RE | Disposition: A | Discharge status: Home / Self Care | Payer: Self-pay | Source: Ambulatory Visit | Attending: Internal Medicine

## 2018-07-08 ENCOUNTER — Other Ambulatory Visit: Payer: Self-pay

## 2018-07-08 DIAGNOSIS — R002 Palpitations: Secondary | ICD-10-CM | POA: Insufficient documentation

## 2018-07-08 DIAGNOSIS — E1140 Type 2 diabetes mellitus with diabetic neuropathy, unspecified: Secondary | ICD-10-CM | POA: Insufficient documentation

## 2018-07-08 DIAGNOSIS — Z7982 Long term (current) use of aspirin: Secondary | ICD-10-CM | POA: Insufficient documentation

## 2018-07-08 DIAGNOSIS — Z9889 Other specified postprocedural states: Secondary | ICD-10-CM | POA: Insufficient documentation

## 2018-07-08 DIAGNOSIS — I130 Hypertensive heart and chronic kidney disease with heart failure and stage 1 through stage 4 chronic kidney disease, or unspecified chronic kidney disease: Secondary | ICD-10-CM | POA: Insufficient documentation

## 2018-07-08 DIAGNOSIS — I4891 Unspecified atrial fibrillation: Secondary | ICD-10-CM

## 2018-07-08 DIAGNOSIS — E1121 Type 2 diabetes mellitus with diabetic nephropathy: Secondary | ICD-10-CM | POA: Insufficient documentation

## 2018-07-08 DIAGNOSIS — Z8249 Family history of ischemic heart disease and other diseases of the circulatory system: Secondary | ICD-10-CM | POA: Insufficient documentation

## 2018-07-08 DIAGNOSIS — Z888 Allergy status to other drugs, medicaments and biological substances status: Secondary | ICD-10-CM | POA: Insufficient documentation

## 2018-07-08 DIAGNOSIS — F419 Anxiety disorder, unspecified: Secondary | ICD-10-CM | POA: Insufficient documentation

## 2018-07-08 DIAGNOSIS — E1122 Type 2 diabetes mellitus with diabetic chronic kidney disease: Secondary | ICD-10-CM | POA: Insufficient documentation

## 2018-07-08 DIAGNOSIS — F1210 Cannabis abuse, uncomplicated: Secondary | ICD-10-CM | POA: Insufficient documentation

## 2018-07-08 DIAGNOSIS — N183 Chronic kidney disease, stage 3 (moderate): Secondary | ICD-10-CM | POA: Insufficient documentation

## 2018-07-08 DIAGNOSIS — Z79899 Other long term (current) drug therapy: Secondary | ICD-10-CM | POA: Insufficient documentation

## 2018-07-08 DIAGNOSIS — R011 Cardiac murmur, unspecified: Secondary | ICD-10-CM | POA: Insufficient documentation

## 2018-07-08 DIAGNOSIS — I493 Ventricular premature depolarization: Secondary | ICD-10-CM | POA: Insufficient documentation

## 2018-07-08 DIAGNOSIS — Z7984 Long term (current) use of oral hypoglycemic drugs: Secondary | ICD-10-CM | POA: Insufficient documentation

## 2018-07-08 DIAGNOSIS — I5032 Chronic diastolic (congestive) heart failure: Secondary | ICD-10-CM | POA: Insufficient documentation

## 2018-07-08 DIAGNOSIS — Z6836 Body mass index (BMI) 36.0-36.9, adult: Secondary | ICD-10-CM | POA: Insufficient documentation

## 2018-07-08 HISTORY — PX: LOOP RECORDER INSERTION: EP1214

## 2018-07-08 LAB — GLUCOSE, CAPILLARY: Glucose-Capillary: 151 mg/dL — ABNORMAL HIGH (ref 70–99)

## 2018-07-08 SURGERY — LOOP RECORDER INSERTION
Anesthesia: LOCAL

## 2018-07-08 MED ORDER — LIDOCAINE HCL (PF) 1 % IJ SOLN
INTRAMUSCULAR | Status: AC
  Filled 2018-07-08: qty 30

## 2018-07-08 MED ORDER — LIDOCAINE-EPINEPHRINE (PF) 1 %-1:200000 IJ SOLN
INTRAMUSCULAR | Status: AC
  Filled 2018-07-08: qty 30

## 2018-07-08 SURGICAL SUPPLY — 2 items
LOOP REVEAL LINQSYS (Prosthesis & Implant Heart) ×2 IMPLANT
PACK LOOP INSERTION (CUSTOM PROCEDURE TRAY) ×2 IMPLANT

## 2018-07-08 NOTE — Interval H&P Note (Signed)
History and Physical Interval Note:  07/08/2018 8:41 AM  Steve Buck.  has presented today for surgery, with the diagnosis of Loop Recorder Insertion   Afib      Bedside  Start time 8:30a  The various methods of treatment have been discussed with the patient and family. After consideration of risks, benefits and other options for treatment, the patient has consented to  Procedure(s): LOOP RECORDER INSERTION (N/A) as a surgical intervention .  The patient's history has been reviewed, patient examined, no change in status, stable for surgery.  I have reviewed the patient's chart and labs.  Questions were answered to the patient's satisfaction.     Virl Axe

## 2018-07-13 ENCOUNTER — Telehealth: Payer: Self-pay | Admitting: *Deleted

## 2018-07-13 NOTE — Telephone Encounter (Addendum)
Spoke with patient requesting to send manual transmission to review 3 "AF" episodes. 1 available ECG appears SR with ectopy. 1 symptom episode reveals SR with PVCS. Patient reports palpiations. Advised patient we will place in Dr. Olin Pia folder for review and call back if further recommendations are given.

## 2018-07-14 NOTE — Telephone Encounter (Signed)
Manual transmission received and reviewed. 3 "AF" episodes-- ECGs appear SB/SR with PVCs.

## 2018-07-22 ENCOUNTER — Ambulatory Visit (INDEPENDENT_AMBULATORY_CARE_PROVIDER_SITE_OTHER): Payer: Self-pay | Admitting: *Deleted

## 2018-07-22 ENCOUNTER — Other Ambulatory Visit: Payer: Self-pay

## 2018-07-22 DIAGNOSIS — E1121 Type 2 diabetes mellitus with diabetic nephropathy: Secondary | ICD-10-CM

## 2018-07-22 DIAGNOSIS — Z794 Long term (current) use of insulin: Principal | ICD-10-CM

## 2018-07-22 DIAGNOSIS — N183 Chronic kidney disease, stage 3 unspecified: Secondary | ICD-10-CM

## 2018-07-22 DIAGNOSIS — R002 Palpitations: Secondary | ICD-10-CM

## 2018-07-22 DIAGNOSIS — E119 Type 2 diabetes mellitus without complications: Secondary | ICD-10-CM

## 2018-07-22 DIAGNOSIS — I493 Ventricular premature depolarization: Secondary | ICD-10-CM

## 2018-07-22 DIAGNOSIS — I10 Essential (primary) hypertension: Secondary | ICD-10-CM

## 2018-07-22 DIAGNOSIS — M1A9XX0 Chronic gout, unspecified, without tophus (tophi): Secondary | ICD-10-CM

## 2018-07-22 DIAGNOSIS — I152 Hypertension secondary to endocrine disorders: Secondary | ICD-10-CM

## 2018-07-22 DIAGNOSIS — E1159 Type 2 diabetes mellitus with other circulatory complications: Secondary | ICD-10-CM

## 2018-07-22 DIAGNOSIS — I4891 Unspecified atrial fibrillation: Secondary | ICD-10-CM

## 2018-07-22 LAB — CUP PACEART INCLINIC DEVICE CHECK
Implantable Pulse Generator Implant Date: 20191024
MDC IDC SESS DTM: 20191107113137

## 2018-07-22 NOTE — Progress Notes (Signed)
Wound check appointment. Steri-strips removed prior to appointment. Wound without redness or edema. Incision edges approximated, wound well healed. Battery status: Good. R-waves 0.8mV. 2 symptom episodes-- EGMs appear SR with PVCs. 0 tachy episodes, 0 pause episodes, 0 brady episodes. 4 AF episodes (0.2% burden)-- EGMs appear SR/SB with PVCs. Monthly summary reports. Per patient, to schedule follow-up with SK after Cardiac CT is performed.

## 2018-07-23 LAB — COMPREHENSIVE METABOLIC PANEL
ALBUMIN: 3.9 g/dL (ref 3.6–4.8)
ALK PHOS: 80 IU/L (ref 39–117)
ALT: 19 IU/L (ref 0–44)
AST: 19 IU/L (ref 0–40)
Albumin/Globulin Ratio: 1.3 (ref 1.2–2.2)
BILIRUBIN TOTAL: 0.5 mg/dL (ref 0.0–1.2)
BUN / CREAT RATIO: 19 (ref 10–24)
BUN: 32 mg/dL — AB (ref 8–27)
CHLORIDE: 99 mmol/L (ref 96–106)
CO2: 27 mmol/L (ref 20–29)
CREATININE: 1.72 mg/dL — AB (ref 0.76–1.27)
Calcium: 9.3 mg/dL (ref 8.6–10.2)
GFR calc Af Amer: 48 mL/min/{1.73_m2} — ABNORMAL LOW (ref >59)
GFR calc non Af Amer: 41 mL/min/{1.73_m2} — ABNORMAL LOW (ref >59)
Globulin, Total: 3 g/dL (ref 1.5–4.5)
Glucose: 138 mg/dL — ABNORMAL HIGH (ref 65–99)
Potassium: 3.8 mmol/L (ref 3.5–5.2)
SODIUM: 142 mmol/L (ref 134–144)
Total Protein: 6.9 g/dL (ref 6.0–8.5)

## 2018-07-23 LAB — CBC WITH DIFFERENTIAL/PLATELET
BASOS ABS: 0.1 10*3/uL (ref 0.0–0.2)
Basos: 1 %
EOS (ABSOLUTE): 0.4 10*3/uL (ref 0.0–0.4)
Eos: 4 %
Hematocrit: 38.8 % (ref 37.5–51.0)
Hemoglobin: 13.2 g/dL (ref 13.0–17.7)
Immature Grans (Abs): 0 10*3/uL (ref 0.0–0.1)
Immature Granulocytes: 0 %
LYMPHS ABS: 3.1 10*3/uL (ref 0.7–3.1)
Lymphs: 38 %
MCH: 30.7 pg (ref 26.6–33.0)
MCHC: 34 g/dL (ref 31.5–35.7)
MCV: 90 fL (ref 79–97)
MONOS ABS: 0.6 10*3/uL (ref 0.1–0.9)
Monocytes: 7 %
NEUTROS ABS: 4.1 10*3/uL (ref 1.4–7.0)
Neutrophils: 50 %
Platelets: 244 10*3/uL (ref 150–450)
RBC: 4.3 x10E6/uL (ref 4.14–5.80)
RDW: 14 % (ref 12.3–15.4)
WBC: 8.2 10*3/uL (ref 3.4–10.8)

## 2018-07-23 LAB — LIPID PANEL
CHOL/HDL RATIO: 5 ratio (ref 0.0–5.0)
Cholesterol, Total: 144 mg/dL (ref 100–199)
HDL: 29 mg/dL — ABNORMAL LOW (ref >39)
LDL CALC: 69 mg/dL (ref 0–99)
TRIGLYCERIDES: 232 mg/dL — AB (ref 0–149)
VLDL CHOLESTEROL CAL: 46 mg/dL — AB (ref 5–40)

## 2018-07-23 LAB — URIC ACID: Uric Acid: 8.5 mg/dL (ref 3.7–8.6)

## 2018-07-23 LAB — HEMOGLOBIN A1C
ESTIMATED AVERAGE GLUCOSE: 123 mg/dL
Hgb A1c MFr Bld: 5.9 % — ABNORMAL HIGH (ref 4.8–5.6)

## 2018-07-29 ENCOUNTER — Ambulatory Visit: Payer: Self-pay | Admitting: Adult Health

## 2018-08-05 ENCOUNTER — Encounter: Payer: Self-pay | Admitting: Adult Health

## 2018-08-05 ENCOUNTER — Ambulatory Visit: Payer: Self-pay | Admitting: Adult Health

## 2018-08-05 VITALS — BP 128/70 | HR 58 | Temp 97.7°F | Ht 72.0 in | Wt 275.8 lb

## 2018-08-05 DIAGNOSIS — E1121 Type 2 diabetes mellitus with diabetic nephropathy: Secondary | ICD-10-CM

## 2018-08-05 DIAGNOSIS — N183 Chronic kidney disease, stage 3 unspecified: Secondary | ICD-10-CM

## 2018-08-05 DIAGNOSIS — I4891 Unspecified atrial fibrillation: Secondary | ICD-10-CM

## 2018-08-05 DIAGNOSIS — IMO0002 Reserved for concepts with insufficient information to code with codable children: Secondary | ICD-10-CM

## 2018-08-05 DIAGNOSIS — I10 Essential (primary) hypertension: Principal | ICD-10-CM

## 2018-08-05 DIAGNOSIS — E1159 Type 2 diabetes mellitus with other circulatory complications: Secondary | ICD-10-CM

## 2018-08-05 DIAGNOSIS — I493 Ventricular premature depolarization: Secondary | ICD-10-CM | POA: Insufficient documentation

## 2018-08-05 DIAGNOSIS — E1140 Type 2 diabetes mellitus with diabetic neuropathy, unspecified: Secondary | ICD-10-CM

## 2018-08-05 DIAGNOSIS — E1165 Type 2 diabetes mellitus with hyperglycemia: Secondary | ICD-10-CM

## 2018-08-05 DIAGNOSIS — R001 Bradycardia, unspecified: Secondary | ICD-10-CM

## 2018-08-05 DIAGNOSIS — I152 Hypertension secondary to endocrine disorders: Secondary | ICD-10-CM

## 2018-08-05 MED ORDER — LOSARTAN POTASSIUM 50 MG PO TABS: 50.0000 mg | ORAL_TABLET | Freq: Every day | ORAL | 2 refills | Status: DC

## 2018-08-05 NOTE — Progress Notes (Signed)
Patient: Steve Buck. Male    DOB: 1955-11-28   62 y.o.   MRN: [id number] Visit Date: 08/05/2018  Today's Provider: Deforest Hoyles, NP   Chief Complaint  Patient presents with  . Follow-up    lab review, needs refill for Losartan Potassium.   Subjective:    HPI: 62 Y/O MALE seen for f/u T2DM, hyperlipidemia, diabetic neuropathy and cardiac arrhythmia. He reports doing well. He is taking all medications as prescribed and monitoring his blood pressure, blood glucose and HR at home.  His blood pressure readings are all less than 130/90.  His heart rate fluctuates between 50 and 68 bpm.  Sometimes it is irregular sometimes is regular.  His blood glucose levels are all less than 150 mg/dL.  His fasting levels are all less than 100 mg/dL.  He denies any hypoglycemic episodes.  He continues to adhere to a low concentrated sweets and heart healthy diet.  He reports poor sleep quality and attributes it to his wife working nights.  States that he has difficulty falling asleep but once he falls asleep he sleeps through the night. He was seen by cardiology and placed an external EKG monitor.  It showed some PVCs and atrial fibrillation and hence a loop monitor with placed.  He is following up with cardiology as scheduled.  He denies any chest pain, but reports intermittent palpitations that are not sustained. He reports persistent neuropathic pain but reports that the pain does not interfere with helps his ability to sleep.  Reports moderate control with Neurontin  Allergies  Allergen Reactions  . Lisinopril Rash and Cough   Previous Medications   ALLOPURINOL (ZYLOPRIM) 100 MG TABLET    Take 1 tablet (100 mg total) by mouth daily.   AMLODIPINE (NORVASC) 5 MG TABLET    Take 1 tablet (5 mg total) by mouth daily.   ASPIRIN EC 81 MG TABLET    Take 1 tablet (81 mg total) by mouth daily.   ATORVASTATIN (LIPITOR) 10 MG TABLET    Take 1 tablet (10 mg total) by mouth daily.   CLONIDINE  (CATAPRES) 0.3 MG TABLET    Take 1 tablet (0.3 mg total) by mouth 2 (two) times daily.   GABAPENTIN (NEURONTIN) 100 MG CAPSULE    Take 1 capsule (100 mg total) by mouth 2 (two) times daily. One tablet in the morning and one tablet in the afternoon   GABAPENTIN (NEURONTIN) 300 MG CAPSULE    Take 1 capsule (300 mg total) by mouth at bedtime.   GLUCOSE BLOOD TEST STRIP    1 each by Other route 3 (three) times daily. Use as instructed   HYDROCHLOROTHIAZIDE (HYDRODIURIL) 25 MG TABLET    Take 1 tablet (25 mg total) by mouth daily.   HYDROCORTISONE 2.5 % OINTMENT    Apply topically 2 (two) times daily.   IBUPROFEN (ADVIL,MOTRIN) 200 MG TABLET    Take 400 mg by mouth every 8 (eight) hours as needed (for pain.).    LANCETS MISC    1 each by Does not apply route 3 (three) times daily.   LOSARTAN (COZAAR) 50 MG TABLET    Take 50 mg by mouth at bedtime.   METFORMIN (GLUCOPHAGE) 500 MG TABLET    Take 1 tablet (500 mg total) by mouth 2 (two) times daily with a meal.   METOPROLOL TARTRATE (LOPRESSOR) 25 MG TABLET    Take 1 tablet (25 mg total) by mouth 2 (two) times daily.   PSYLLIUM (METAMUCIL)  58.6 % POWDER    Take 1 packet by mouth 2 (two) times daily.     Review of Systems  Constitutional: Negative.   Eyes: Negative.   Respiratory: Negative.   Cardiovascular: Positive for palpitations. Negative for chest pain and leg swelling.  Endocrine: Negative.   Genitourinary: Negative.   Musculoskeletal: Negative.   Skin: Negative.   Neurological: Positive for numbness (And tingling in bilateral lower extremities).    Social History   Tobacco Use  . Smoking status: Never Smoker  . Smokeless tobacco: Never Used  Substance Use Topics  . Alcohol use: Not Currently   Objective:   BP 128/70 (BP Location: Left Arm, Patient Position: Sitting, Cuff Size: Large)   Pulse (!) 58   Temp 97.7 F (36.5 C) (Oral)   Ht 6' (1.829 m)   Wt 275 lb 12.8 oz (125.1 kg)   BMI 37.41 kg/m   Physical Exam   Constitutional: He is oriented to person, place, and time. He appears well-developed and well-nourished.  Eyes: Pupils are equal, round, and reactive to light. EOM are normal.  Neck: Normal range of motion. Neck supple. No JVD present.  Cardiovascular: Normal rate, regular rhythm, normal heart sounds and intact distal pulses.  Pulmonary/Chest: Effort normal and breath sounds normal.  Abdominal: Soft. Bowel sounds are normal.  Central obesity  Musculoskeletal: Normal range of motion.  Neurological: He is alert and oriented to person, place, and time. A sensory deficit (Abnormal monofilament exam) is present.  Skin: Skin is warm and dry.  Psychiatric: He has a normal mood and affect.  Nursing note and vitals reviewed.  Assessment & Plan:  1. Hypertension associated with diabetes ([place]) Well-controlled.  Continue current medications.  Monitor blood pressure at home and record readings.  Bring BP log with next appointment.   2. Diabetic nephropathy associated with type 2 diabetes mellitus ([place]) Well-controlled.  Continue metformin 500 mg twice daily.  Will obtain routine labs 1 week prior to next appointment - Comp Met (CMET); Future - HgB A1c; Future - Lipid Profile; Future - Magnesium; Future - Phosphorus; Future  3. Type 2 diabetes mellitus, uncontrolled, with neuropathy (HCC) Continue gabapentin as scheduled. Foot care and leg exercises reviewed  4. CKD (chronic kidney disease) stage 3, GFR 30-59 ml/min (HCC) Continue losartan for renal protection  5. Morbid obesity ([place]) Continue weight loss efforts.  Diet and exercise recommendations reviewed  6. Bradycardia Asymptomatic at this point.  Continue follow-up with cardiology 7. PVC's (premature ventricular contractions) Continue follow-up with cardiology as scheduled  8. Atrial fibrillation, unspecified type ([place]) Loop monitor in place.  Continue follow-up with cardiology, beta-blocker for rate control and risk factor  control  Magddalene Bobbye Riggs, NP   [facility]

## 2018-08-10 ENCOUNTER — Ambulatory Visit (INDEPENDENT_AMBULATORY_CARE_PROVIDER_SITE_OTHER): Payer: Self-pay

## 2018-08-10 DIAGNOSIS — R002 Palpitations: Secondary | ICD-10-CM

## 2018-08-11 ENCOUNTER — Telehealth: Payer: Self-pay | Admitting: *Deleted

## 2018-08-11 NOTE — Telephone Encounter (Signed)
Spoke with Patient in regards to symptom+AF episode noted 08/08/18 at 14:41 for 26 minutes and 18:37 for 1 hour 6 minutes. Patient states he felt his "heart beating fast and jumping around", then he became sick to his stomach and vomitted. Advised patient Dr. Caryl Comes reviewed the episode and recommends to continue to monitor due to SCAF at this time. Also, Dr. Caryl Comes sent a message to Nira Conn, RN to schedule the patient with an appointment after his CTA is done on 08/19/18. Patient verbalized understanding and appreciation.

## 2018-08-11 NOTE — Progress Notes (Signed)
Carelink Summary Report / Loop Recorder 

## 2018-08-19 ENCOUNTER — Ambulatory Visit (HOSPITAL_COMMUNITY)
Admission: RE | Admit: 2018-08-19 | Discharge: 2018-08-19 | Disposition: A | Discharge status: Home / Self Care | Payer: Self-pay | Source: Ambulatory Visit | Attending: Internal Medicine | Admitting: Internal Medicine

## 2018-08-19 DIAGNOSIS — I2510 Atherosclerotic heart disease of native coronary artery without angina pectoris: Secondary | ICD-10-CM

## 2018-08-19 DIAGNOSIS — R079 Chest pain, unspecified: Secondary | ICD-10-CM | POA: Insufficient documentation

## 2018-08-19 IMAGING — CT CT HEART MORP W/ CTA COR W/ SCORE W/ CA W/CM &/OR W/O CM
4 of 7 series · 8 of 20 positions shown, 9 images · IV contrast (APPLIED)
Comparison: None.

Addendum:
CLINICAL DATA: 62-year-old male with frequent PVCs, atrial
fibrillation, obesity and hypertension.

EXAM:
Cardiac/Coronary  CT
TECHNIQUE: The patient was scanned on a [employer] Force scanner.

[Series 6: best diast 72 % · axial · 0.41mm/px · z∈[+1084,+1136]mm · 2 of 388 slices shown, 3 images]
[im 130/388  vessel]
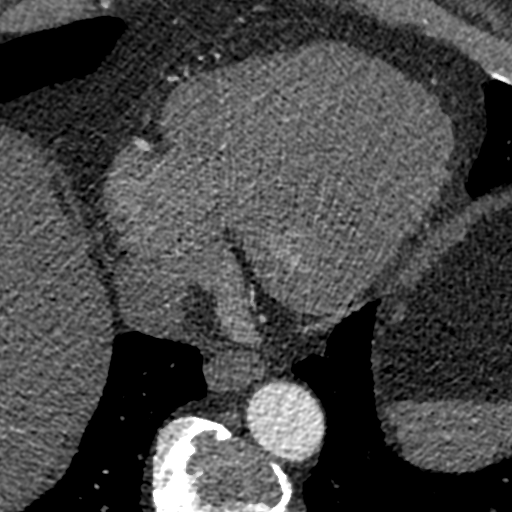
[im 130/388  lung]
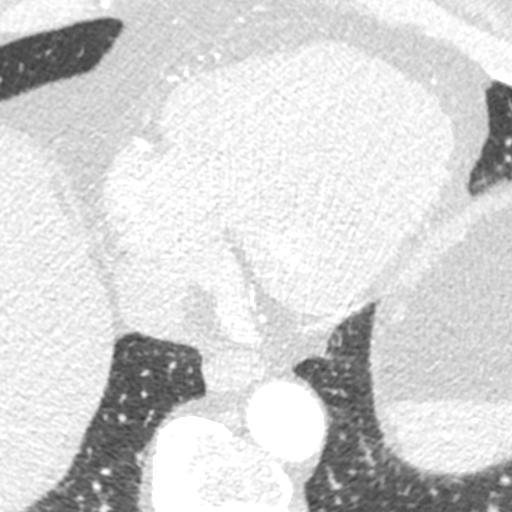
[im 259/388  vessel]
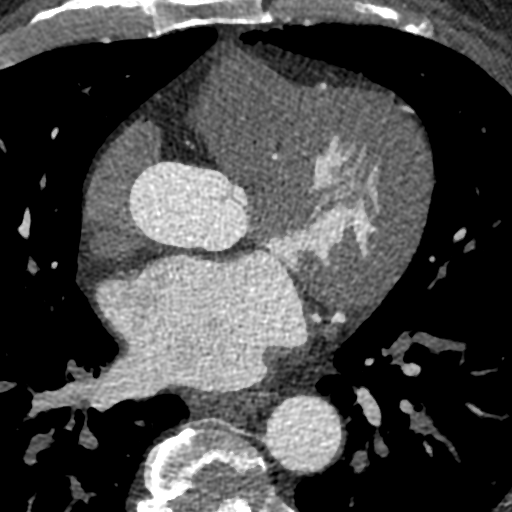

[Series 7: best syst 32 % · axial · 0.41mm/px · z∈[+1084,+1136]mm · 2 of 388 slices shown]
[im 130/388  vessel]
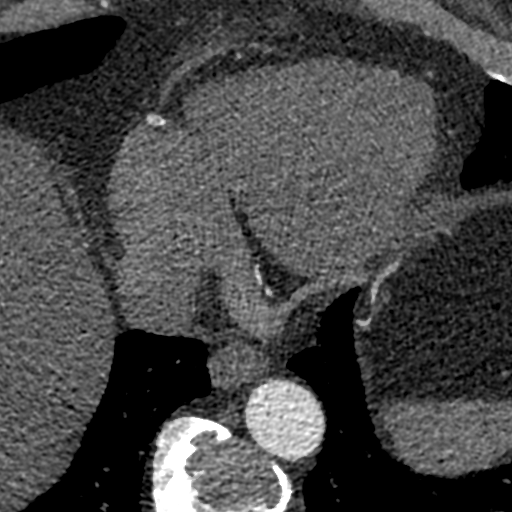
[im 259/388  vessel]
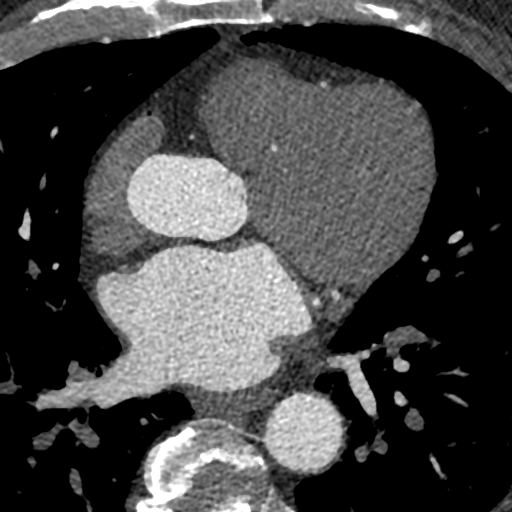

[Series 8: ts diast sharp 72 % · axial · 0.41mm/px · z∈[+1084,+1136]mm · 2 of 388 slices shown]
[im 130/388  lung]
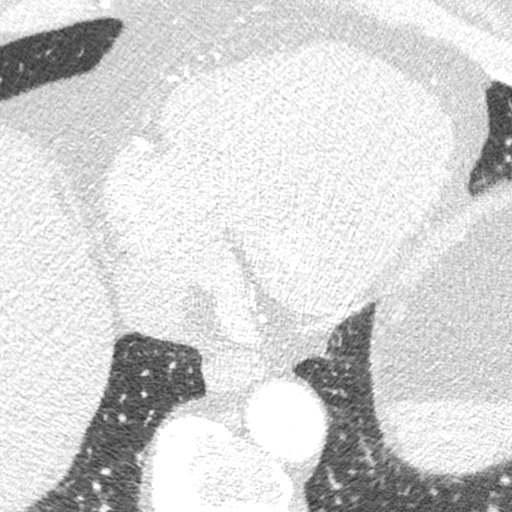
[im 259/388  lung]
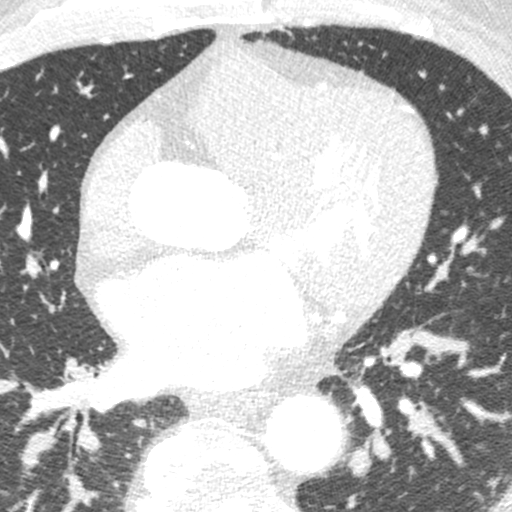

[Series 9: ts syst sharp 32 % · axial · 0.41mm/px · z∈[+1084,+1136]mm · 2 of 388 slices shown]
[im 130/388  lung]
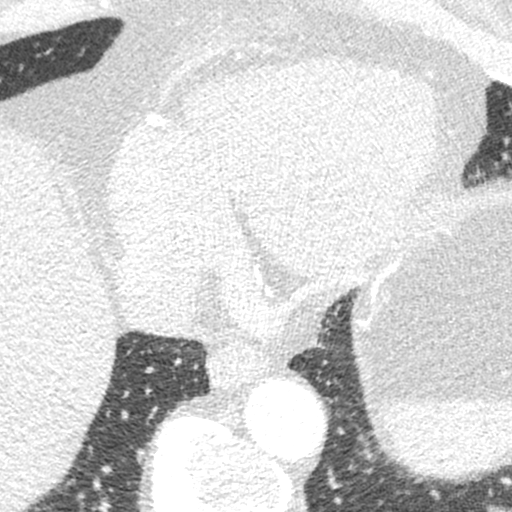
[im 259/388  lung]
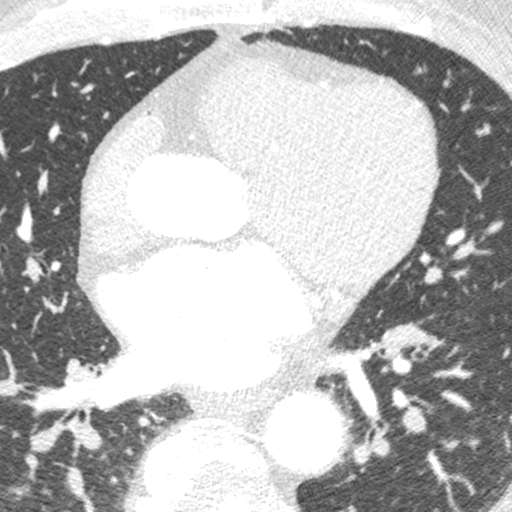

[8 of 20 positions shown; findings below may reference images not displayed]



Aorta: There is ascending aortic aneurysm with maximum diameter 43
mm. Minimal calcifications and atherosclerosis. No dissection.

Aortic Valve: Trileaflet. Minimal leaflet thickening and
calcifications.

Coronary Arteries: Normal coronary origin. Right and left
co-dominance.

RCA is a large dominant artery that gives rise to PDA. There is mild
diffuse mixed plaque in the proximal RCA with associated stenosis
25-50%. Mid RCA has moderate mixed plaque with stenosis 50-69%. PDA
is poorly visualized and has mild diffuse mixed plaque with stenosis
25-50%.

Left main is a very large artery with luminal diameter of 9 mm. It
gives rise to LAD and LCX arteries and has minimal mixed plaque with
stenosis 0-25%.

LAD is a large tortuous vessel that gives rise to two diagonal
arteries and a large septal perforator. Proximal LAD has mild mixed
plaque with stenosis 25-50%. Mid LAD has moderate predominantly
noncalcified plaque with stenosis 50-69%. Mid and distal LAD has
mild diffuse plaque.

D1 is a large artery with moderate noncalcified plaque in the
proximal segment with stenosis 50-69%.

D2 is a large artery with mild diffuse plaque and is occluded in the
mid portion.

LCX is a co-dominant artery that gives rise to one large OM1 branch
and a small PLA. There is moderate diffuse plaque in the proximal
and mid LCX artery and mid OM1 with stenosis 50-69%.

PLA is poorly visualized.

Other findings:

Normal pulmonary vein drainage into the left atrium.

Normal let atrial appendage without a thrombus.
IMPRESSION: 1. Coronary calcium score of 542. This was 84 percentile for age and
sex matched control.

2. Normal coronary origin with right and left co-dominance.

3. Study quality limited by patients size. There is diffuse CAD with
moderate stenosis in the mid RCA, mid LAD, proximal D1, proximal LCX
and mid OM1 artery. Additional analysis with CT FFR will be
submitted.

4. There is ascending aortic aneurysm with maximum diameter 43 mm.
Annual follow up with chest CTA or MRA is recommended.

5. Mildly dilated pulmonary artery measuring 32 mm suggestive of
pulmonary hypertension.

EXAM:
OVER-READ INTERPRETATION CT CHEST

The following report is an over-read performed by radiologist Dr.
over-read does not include interpretation of cardiac or coronary
anatomy or pathology. The calcium score/coronary CTA interpretation
by the cardiologist is attached.
FINDINGS: Cardiovascular: No visible aortic atherosclerosis. Ectatic ascending
thoracic aortic aneurysm measuring approximately 4.2 cm diameter.

Mediastinum/Nodes: No pathologic lymphadenopathy within the
visualized mediastinum. Visualized esophagus normal in appearance.

Lungs/Pleura: Minimal pleuroparenchymal scarring in the lingula.
Visualized lung parenchyma otherwise clear. Central airways patent
without significant bronchial wall thickening. No pleural effusions.

Chest Wall: Minimal LEFT gynecomastia.

Upper Abdomen: Unremarkable for the early arterial phase of
enhancement which accounts for the heterogeneous enhancement of the
spleen.

Musculoskeletal: Degenerative changes and DISH involving the LOWER
thoracic spine. No acute findings.
IMPRESSION: 1. Ectatic ascending thoracic aorta measures approximately 4.2 cm
diameter. Recommend annual imaging followup by CTA or MRA. This
recommendation follows 3636
ACCF/AHA/AATS/ACR/ASA/SCA/HASIMPEPIC/DZULIANO/BRIA/HEINER Guidelines for the
Diagnosis and Management of Patients with Thoracic Aortic Disease.
Circulation. 3636; 121: [id number].
2. No significant extracardiac findings otherwise.

*** End of Addendum ***

## 2018-08-19 MED ORDER — NITROGLYCERIN 0.4 MG SL SUBL
0.8000 mg | SUBLINGUAL_TABLET | Freq: Once | SUBLINGUAL | Status: AC
  Administered 2018-08-19: 0.8 mg via SUBLINGUAL
  Filled 2018-08-19: qty 25

## 2018-08-19 MED ORDER — IOPAMIDOL (ISOVUE-370) INJECTION 76%
100.0000 mL | Freq: Once | INTRAVENOUS | Status: AC | PRN
  Administered 2018-08-19: 100 mL via INTRAVENOUS

## 2018-08-26 ENCOUNTER — Other Ambulatory Visit: Payer: Self-pay

## 2018-08-26 ENCOUNTER — Telehealth: Payer: Self-pay | Admitting: Cardiology

## 2018-08-26 ENCOUNTER — Telehealth: Payer: Self-pay

## 2018-08-26 MED ORDER — GABAPENTIN 100 MG PO CAPS: 100.0000 mg | ORAL_CAPSULE | Freq: Two times a day (BID) | ORAL | 3 refills | Status: DC

## 2018-08-26 NOTE — Telephone Encounter (Signed)
Spoke w/ pt and requested a manual transmission.  

## 2018-08-26 NOTE — Telephone Encounter (Signed)
Spoke with pt regarding his linq event from 08/21/18 which showed AF starting around [place] through 1505. Pt states he was "really sick to my stomach that morning and vomited 4 times from 0700 to 0800." Pt states he felt bad for the majority of the day as well. I have advised pt to continue his current medication regimen. We will be in touch after Dr Caryl Comes has reviewed, or Dr Caryl Comes may review during his OV appt on 12/19. Pt has verbalized understanding and had no additional questions.

## 2018-09-02 ENCOUNTER — Other Ambulatory Visit: Payer: Self-pay | Admitting: Internal Medicine

## 2018-09-02 ENCOUNTER — Encounter: Payer: Self-pay | Admitting: Internal Medicine

## 2018-09-02 ENCOUNTER — Ambulatory Visit (INDEPENDENT_AMBULATORY_CARE_PROVIDER_SITE_OTHER): Payer: Self-pay | Admitting: Internal Medicine

## 2018-09-02 VITALS — BP 116/72 | HR 49 | Ht 72.0 in | Wt 184.0 lb

## 2018-09-02 DIAGNOSIS — I4891 Unspecified atrial fibrillation: Secondary | ICD-10-CM

## 2018-09-02 DIAGNOSIS — R002 Palpitations: Secondary | ICD-10-CM

## 2018-09-02 DIAGNOSIS — I493 Ventricular premature depolarization: Secondary | ICD-10-CM

## 2018-09-02 NOTE — Progress Notes (Addendum)
Patient Care Team: Default, Provider, MD as PCP - General [place], MD as PCP - Cardiology (Cardiology)   HPI  Steve Buck. is a 61 y.o. male Seen in follow-up for PVCs and secondary atrial fibrillation.  It was elected to implant a monitor to look for evidence of recurrent atrial fibrillation rather than embark on anticoagulation not withstanding the BMJ study from the [employer].  He has had multiple false positive detections for atrial fibrillation; he is here to have his device reprogrammed.  He also notes that he has extensive cost and is on a very limited income.  This is very stressful for him  No CP or SOB  DATE TEST EF   4/19 MYOVIEW   37 % No ischemia  4/19 Echo   60-65 %   12/19 CTA  CaScore 540 -- FFR neg Ao Root 43   Addended 68/12 Thromboembolic risk factors ( age -52, HTN-1, DM-1) for a CHADSVASc Score of 3   Records and Results Reviewed   Past Medical History:  Diagnosis Date  . (HFpEF) heart failure with preserved ejection fraction ([place])    a. 12/2017 Echo: EF 60-65%, no rwma, Gr1DD, mild MR, mildly dil LA, nl RV fxn.  . Allergy    Seasonal  . Chest pain    a. 12/2017 Lexiscan MV: EF 37% (nl by echo). Small mild, fixed region of apical thinning, likely attenuation artifact. No ischemia. Low risk.  . CKD (chronic kidney disease) stage 3, GFR 30-59 ml/min (HCC) 03/30/2018  . Diabetic nephropathy associated with type 2 diabetes mellitus ([place]) 03/25/2018  . DKA (diabetic ketoacidoses) ([place]) 12/01/2017  . Heart murmur    a. 12/2017 Echo: Mild MR.  Marland Kitchen History of Bell's palsy 03/30/2018  . Hypertension   . Morbid obesity ([place])   . Palpitations   . PVC's (premature ventricular contractions)    a. 05/2018 Holter: RSR, avg rate of 60. Freq PVC's (6.4% burden). Some ventricular bigeminy/trigeminy.  . Type 2 diabetes mellitus, uncontrolled, with neuropathy ([place])     Past Surgical History:  Procedure Laterality Date  . colonoscvopy   2015  . FOOT SURGERY  around 1994  . LOOP RECORDER INSERTION N/A 07/08/2018   Procedure: LOOP RECORDER INSERTION;  Surgeon: Deboraha Sprang, MD;  Location: [facility];  Service: Cardiovascular;  Laterality: N/A;    Current Meds  Medication Sig  . allopurinol (ZYLOPRIM) 100 MG tablet Take 1 tablet (100 mg total) by mouth daily.  Marland Kitchen aspirin EC 81 MG tablet Take 1 tablet (81 mg total) by mouth daily.  Marland Kitchen atorvastatin (LIPITOR) 10 MG tablet Take 1 tablet (10 mg total) by mouth daily.  . cloNIDine (CATAPRES) 0.3 MG tablet Take 1 tablet (0.3 mg total) by mouth 2 (two) times daily.  Marland Kitchen gabapentin (NEURONTIN) 100 MG capsule Take 1 capsule (100 mg total) by mouth 2 (two) times daily. One tablet in the morning and one tablet in the afternoon  . gabapentin (NEURONTIN) 300 MG capsule Take 1 capsule (300 mg total) by mouth at bedtime.  Marland Kitchen glucose blood test strip 1 each by Other route 3 (three) times daily. Use as instructed  . hydrochlorothiazide (HYDRODIURIL) 25 MG tablet Take 1 tablet (25 mg total) by mouth daily.  . hydrocortisone 2.5 % ointment Apply topically 2 (two) times daily.  Marland Kitchen ibuprofen (ADVIL,MOTRIN) 200 MG tablet Take 400 mg by mouth every 8 (eight) hours as needed (for pain.).   Marland Kitchen  Lancets MISC 1 each by Does not apply route 3 (three) times daily.  Marland Kitchen losartan (COZAAR) 50 MG tablet Take 1 tablet (50 mg total) by mouth at bedtime.  . metFORMIN (GLUCOPHAGE) 500 MG tablet Take 1 tablet (500 mg total) by mouth 2 (two) times daily with a meal.  . metoprolol tartrate (LOPRESSOR) 25 MG tablet Take 1 tablet (25 mg total) by mouth 2 (two) times daily.  . psyllium (METAMUCIL) 58.6 % powder Take 1 packet by mouth 2 (two) times daily.     Allergies  Allergen Reactions  . Lisinopril Rash and Cough      Review of Systems negative except from HPI and PMH  Physical Exam BP 116/72 (BP Location: Left Arm, Patient Position: Sitting, Cuff Size: Normal)   Pulse (!) 49   Ht 6' (1.829 m)   Wt  184 lb (83.5 kg)   BMI 24.95 kg/m  Well developed and nourished in no acute distress HENT normal Neck supple with JVP-flat Clear Regular rate and rhythm, no murmurs or gallops Abd-soft with active BS No Clubbing cyanosis edema Skin-warm and dry A & Oriented  Grossly normal sensory and motor function     Assessment and  Plan  PVCs left bundle inferior axis with a QS in lead aVR and R wave in lead aVL-delayed intrinsicoid deflection  Hypertension  Atrial fibrillation-secondary  Obesity  Anxiety   Aortic enlargement  Gynecomastia Left   Infrequent PVCs.  Event recorder was placed.  No interval atrial fibrillation.  Many false positives.  Have reprogrammed his device today from detections to greater than 1 hour for specificity.  Extreme financial stress.  We will delay monitoring for 6 months.  And see him in the office at that time  Will need reevaluation of his aortic root in about 1 year.  Current medicines are reviewed at length with the patient today .  The patient does not have concerns regarding medicines.

## 2018-09-02 NOTE — Patient Instructions (Addendum)
Medication Instructions:  - Your physician recommends that you continue on your current medications as directed. Please refer to the Current Medication list given to you today.  If you need a refill on your cardiac medications before your next appointment, please call your pharmacy.   Lab work: - none ordered  If you have labs (blood work) drawn today and your tests are completely normal, you will receive your results only by: Marland Kitchen MyChart Message (if you have MyChart) OR . A paper copy in the mail If you have any lab test that is abnormal or we need to change your treatment, we will call you to review the results.  Testing/Procedures: - none ordered  Follow-Up: At [facility], you and your health needs are our priority.  As part of our continuing mission to provide you with exceptional heart care, we have created designated Provider Care Teams.  These Care Teams include your primary Cardiologist (physician) and Advanced Practice Providers (APPs -  Physician Assistants and Nurse Practitioners) who all work together to provide you with the care you need, when you need it. You will need a follow up appointment in 6 months with Dr. Caryl Comes.  Please call our office 2 months in advance to schedule this appointment.   Any Other Special Instructions Will Be Listed Below (If Applicable). - N/A

## 2018-09-13 ENCOUNTER — Ambulatory Visit (INDEPENDENT_AMBULATORY_CARE_PROVIDER_SITE_OTHER): Payer: Self-pay

## 2018-09-13 DIAGNOSIS — R002 Palpitations: Secondary | ICD-10-CM

## 2018-09-13 DIAGNOSIS — I4891 Unspecified atrial fibrillation: Secondary | ICD-10-CM

## 2018-09-13 NOTE — Progress Notes (Signed)
Carelink Summary Report / Loop Recorder 

## 2018-09-14 LAB — CUP PACEART REMOTE DEVICE CHECK
Implantable Pulse Generator Implant Date: 20191024
MDC IDC SESS DTM: 20191229133606

## 2018-09-17 ENCOUNTER — Other Ambulatory Visit: Payer: Self-pay | Admitting: Internal Medicine

## 2018-09-26 LAB — CUP PACEART REMOTE DEVICE CHECK
Implantable Pulse Generator Implant Date: 20191024
MDC IDC SESS DTM: 20191126131147

## 2018-10-15 ENCOUNTER — Ambulatory Visit (INDEPENDENT_AMBULATORY_CARE_PROVIDER_SITE_OTHER): Payer: Self-pay

## 2018-10-15 DIAGNOSIS — R002 Palpitations: Secondary | ICD-10-CM

## 2018-10-15 LAB — CUP PACEART REMOTE DEVICE CHECK
Date Time Interrogation Session: 20200131133704
MDC IDC PG IMPLANT DT: 20191024

## 2018-10-22 NOTE — Progress Notes (Signed)
Carelink Summary Report / Loop Recorder 

## 2018-11-04 ENCOUNTER — Encounter: Payer: Self-pay | Admitting: Pharmacist

## 2018-11-04 ENCOUNTER — Other Ambulatory Visit: Payer: Self-pay

## 2018-11-09 ENCOUNTER — Other Ambulatory Visit: Payer: Self-pay

## 2018-11-11 ENCOUNTER — Ambulatory Visit: Payer: Self-pay | Admitting: Gerontology

## 2018-11-11 ENCOUNTER — Other Ambulatory Visit: Payer: Self-pay

## 2018-11-11 DIAGNOSIS — N183 Chronic kidney disease, stage 3 unspecified: Secondary | ICD-10-CM

## 2018-11-11 DIAGNOSIS — R001 Bradycardia, unspecified: Secondary | ICD-10-CM

## 2018-11-11 DIAGNOSIS — I152 Hypertension secondary to endocrine disorders: Secondary | ICD-10-CM

## 2018-11-11 DIAGNOSIS — IMO0002 Reserved for concepts with insufficient information to code with codable children: Secondary | ICD-10-CM

## 2018-11-11 DIAGNOSIS — I493 Ventricular premature depolarization: Secondary | ICD-10-CM

## 2018-11-11 DIAGNOSIS — E1159 Type 2 diabetes mellitus with other circulatory complications: Secondary | ICD-10-CM

## 2018-11-11 DIAGNOSIS — I10 Essential (primary) hypertension: Principal | ICD-10-CM

## 2018-11-11 DIAGNOSIS — E1140 Type 2 diabetes mellitus with diabetic neuropathy, unspecified: Secondary | ICD-10-CM

## 2018-11-11 DIAGNOSIS — E1121 Type 2 diabetes mellitus with diabetic nephropathy: Secondary | ICD-10-CM

## 2018-11-11 DIAGNOSIS — E1165 Type 2 diabetes mellitus with hyperglycemia: Secondary | ICD-10-CM

## 2018-11-12 LAB — COMPREHENSIVE METABOLIC PANEL
A/G RATIO: 1.6 (ref 1.2–2.2)
ALBUMIN: 4.4 g/dL (ref 3.8–4.8)
ALK PHOS: 77 IU/L (ref 39–117)
ALT: 18 IU/L (ref 0–44)
AST: 15 IU/L (ref 0–40)
BILIRUBIN TOTAL: 0.5 mg/dL (ref 0.0–1.2)
BUN / CREAT RATIO: 16 (ref 10–24)
BUN: 24 mg/dL (ref 8–27)
CHLORIDE: 97 mmol/L (ref 96–106)
CO2: 29 mmol/L (ref 20–29)
CREATININE: 1.46 mg/dL — AB (ref 0.76–1.27)
Calcium: 9.9 mg/dL (ref 8.6–10.2)
GFR calc Af Amer: 58 mL/min/{1.73_m2} — ABNORMAL LOW (ref >59)
GFR calc non Af Amer: 50 mL/min/{1.73_m2} — ABNORMAL LOW (ref >59)
Globulin, Total: 2.8 g/dL (ref 1.5–4.5)
Glucose: 83 mg/dL (ref 65–99)
Potassium: 3.8 mmol/L (ref 3.5–5.2)
Sodium: 144 mmol/L (ref 134–144)
Total Protein: 7.2 g/dL (ref 6.0–8.5)

## 2018-11-12 LAB — LIPID PANEL
Chol/HDL Ratio: 5.6 ratio — ABNORMAL HIGH (ref 0.0–5.0)
Cholesterol, Total: 156 mg/dL (ref 100–199)
HDL: 28 mg/dL — ABNORMAL LOW
LDL Calculated: 80 mg/dL (ref 0–99)
Triglycerides: 238 mg/dL — ABNORMAL HIGH (ref 0–149)
VLDL Cholesterol Cal: 48 mg/dL — ABNORMAL HIGH (ref 5–40)

## 2018-11-12 LAB — MAGNESIUM: Magnesium: 1.9 mg/dL (ref 1.6–2.3)

## 2018-11-12 LAB — HEMOGLOBIN A1C
Est. average glucose Bld gHb Est-mCnc: 134 mg/dL
Hgb A1c MFr Bld: 6.3 % — ABNORMAL HIGH (ref 4.8–5.6)

## 2018-11-12 LAB — PHOSPHORUS: PHOSPHORUS: 3.3 mg/dL (ref 2.8–4.1)

## 2018-11-17 ENCOUNTER — Ambulatory Visit (INDEPENDENT_AMBULATORY_CARE_PROVIDER_SITE_OTHER): Payer: Self-pay | Admitting: *Deleted

## 2018-11-17 DIAGNOSIS — I4891 Unspecified atrial fibrillation: Secondary | ICD-10-CM

## 2018-11-17 DIAGNOSIS — R002 Palpitations: Secondary | ICD-10-CM

## 2018-11-17 LAB — CUP PACEART REMOTE DEVICE CHECK
Date Time Interrogation Session: 20200304133824
Implantable Pulse Generator Implant Date: 20191024

## 2018-11-18 ENCOUNTER — Encounter: Payer: Self-pay | Admitting: Adult Health

## 2018-11-18 ENCOUNTER — Ambulatory Visit: Payer: Self-pay | Admitting: Adult Health

## 2018-11-18 ENCOUNTER — Other Ambulatory Visit: Payer: Self-pay

## 2018-11-18 VITALS — BP 134/83 | HR 61 | Temp 98.3°F | Wt 294.0 lb

## 2018-11-18 DIAGNOSIS — E1140 Type 2 diabetes mellitus with diabetic neuropathy, unspecified: Secondary | ICD-10-CM

## 2018-11-18 DIAGNOSIS — K5900 Constipation, unspecified: Secondary | ICD-10-CM

## 2018-11-18 DIAGNOSIS — E1121 Type 2 diabetes mellitus with diabetic nephropathy: Secondary | ICD-10-CM

## 2018-11-18 DIAGNOSIS — IMO0002 Reserved for concepts with insufficient information to code with codable children: Secondary | ICD-10-CM

## 2018-11-18 DIAGNOSIS — I152 Hypertension secondary to endocrine disorders: Secondary | ICD-10-CM

## 2018-11-18 DIAGNOSIS — E1159 Type 2 diabetes mellitus with other circulatory complications: Secondary | ICD-10-CM

## 2018-11-18 DIAGNOSIS — E1165 Type 2 diabetes mellitus with hyperglycemia: Secondary | ICD-10-CM

## 2018-11-18 DIAGNOSIS — I4891 Unspecified atrial fibrillation: Secondary | ICD-10-CM

## 2018-11-18 DIAGNOSIS — I10 Essential (primary) hypertension: Secondary | ICD-10-CM

## 2018-11-18 MED ORDER — HYDROCHLOROTHIAZIDE 25 MG PO TABS: 25.0000 mg | ORAL_TABLET | Freq: Every day | ORAL | 3 refills | Status: DC

## 2018-11-18 MED ORDER — ATORVASTATIN CALCIUM 10 MG PO TABS: 10.0000 mg | ORAL_TABLET | Freq: Every day | ORAL | 3 refills | Status: DC

## 2018-11-18 MED ORDER — POLYETHYLENE GLYCOL 3350 17 GM/SCOOP PO POWD: 17.0000 g | Freq: Every day | ORAL | 3 refills | Status: DC

## 2018-11-18 MED ORDER — CLONIDINE HCL 0.3 MG PO TABS: 0.3000 mg | ORAL_TABLET | Freq: Two times a day (BID) | ORAL | 3 refills | Status: DC

## 2018-11-18 NOTE — Progress Notes (Signed)
Patient: Steve Buck. Male    DOB: 10-Dec-1955   63 y.o.   MRN: [id number] Visit Date: 11/18/2018  Today's Provider: Deforest Hoyles, NP   Chief Complaint  Patient presents with  . Follow-up    C/o midabdominal discomfort each am upon rising. Feels 'full of gas, and I can't have a bowel movement'. Has ocassional emesis in am before breakfast.   Subjective:    HPI This is a 63 y/o male who presents for f/u T2DM with neuropathy, hypertension, CKD and atrial fibrillation.  Today he is he is c/o abdominal pain for a couple of weeks now. Abdominal pain is mostly in the lower quadrants, associated with nausea and vomiting mostly in the morning.  Reports irregular bowel movements.  He is on Metamucil and states that it helps minimally.  He has not been checking his blood glucose levels regularly and did not bring a log of his readings.  He denies any hypo-or hyperglycemic symptoms.  He reports significant improvement in his lower extremity neuropathy.  His last labs showed an improvement in his creatinine as well as his GFR.  He reports normal urine output.  His blood pressures well controlled and he reports taking all his medications as prescribed.  He was seen by cardiology and had a loop monitor placed.  He has not been started on anticoagulation.  He is on a beta-blocker and reports good heart rate control.  He has gained a significant amount of weight since his last visit.  He states that he has not been following a strict diet and has not been exercising like he did. He states that he has been very stressed out because he received lots of bills from multiple healthcare providers and has not been able to pay them.  He was approved for charity care from July 2019 to January 2020.  It is unclear if patient has been submitting his charity care letter to the various healthcare providers.  We have given him a copy of his charity care application approval and also recommended that he applies full  and extension of his charity care.  He reports depression which he associates to his inability to pay his bills. He denies chest pain, palpitations, dizziness and headaches.  No medication side effects reported.   Allergies  Allergen Reactions  . Lisinopril Rash and Cough   Previous Medications   ALLOPURINOL (ZYLOPRIM) 100 MG TABLET    Take 1 tablet (100 mg total) by mouth daily.   AMLODIPINE (NORVASC) 5 MG TABLET    Take 1 tablet (5 mg total) by mouth daily.   ASPIRIN EC 81 MG TABLET    Take 1 tablet (81 mg total) by mouth daily.   ATORVASTATIN (LIPITOR) 10 MG TABLET    Take 1 tablet (10 mg total) by mouth daily.   CLONIDINE (CATAPRES) 0.3 MG TABLET    Take 1 tablet (0.3 mg total) by mouth 2 (two) times daily.   GABAPENTIN (NEURONTIN) 100 MG CAPSULE    Take 1 capsule (100 mg total) by mouth 2 (two) times daily. One tablet in the morning and one tablet in the afternoon   GABAPENTIN (NEURONTIN) 300 MG CAPSULE    Take 1 capsule (300 mg total) by mouth at bedtime.   GLUCOSE BLOOD TEST STRIP    1 each by Other route 3 (three) times daily. Use as instructed   HYDROCHLOROTHIAZIDE (HYDRODIURIL) 25 MG TABLET    Take 1 tablet (25 mg total) by mouth daily.  HYDROCORTISONE 2.5 % OINTMENT    Apply topically 2 (two) times daily.   IBUPROFEN (ADVIL,MOTRIN) 200 MG TABLET    Take 400 mg by mouth every 8 (eight) hours as needed (for pain.).    LANCETS MISC    1 each by Does not apply route 3 (three) times daily.   LOSARTAN (COZAAR) 50 MG TABLET    Take 1 tablet (50 mg total) by mouth at bedtime.   METFORMIN (GLUCOPHAGE) 500 MG TABLET    Take 1 tablet (500 mg total) by mouth 2 (two) times daily with a meal.   METOPROLOL TARTRATE (LOPRESSOR) 25 MG TABLET    Take 1 tablet (25 mg total) by mouth 2 (two) times daily.   PSYLLIUM (METAMUCIL) 58.6 % POWDER    Take 1 packet by mouth 2 (two) times daily.     Review of Systems  Constitutional: Positive for fatigue. Negative for chills and diaphoresis.   Respiratory: Negative for cough, shortness of breath and wheezing.   Cardiovascular: Negative for chest pain, palpitations and leg swelling.  Gastrointestinal: Positive for abdominal pain, constipation, nausea and vomiting.  Endocrine: Negative.   Genitourinary: Negative.   Musculoskeletal: Negative.   Neurological: Negative.   Psychiatric/Behavioral: Negative.     Social History   Tobacco Use  . Smoking status: Never Smoker  . Smokeless tobacco: Never Used  Substance Use Topics  . Alcohol use: Not Currently   Objective:   BP 134/83 (BP Location: Left Arm, Patient Position: Sitting, Cuff Size: Large)   Pulse 61   Temp 98.3 F (36.8 C) (Oral)   Wt 294 lb (133.4 kg)   SpO2 96%   BMI 39.87 kg/m   Physical Exam Vitals signs and nursing note reviewed.  Constitutional:      Appearance: Normal appearance. He is obese.  Eyes:     Extraocular Movements: Extraocular movements intact.     Conjunctiva/sclera: Conjunctivae normal.     Pupils: Pupils are equal, round, and reactive to light.  Cardiovascular:     Rate and Rhythm: Regular rhythm. Tachycardia present.     Pulses: Normal pulses.     Heart sounds: Normal heart sounds.  Pulmonary:     Effort: Pulmonary effort is normal.     Breath sounds: Normal breath sounds.  Abdominal:     General: Bowel sounds are normal.     Palpations: Abdomen is soft.  Musculoskeletal: Normal range of motion.  Skin:    General: Skin is warm.     Capillary Refill: Capillary refill takes less than 2 seconds.  Neurological:     General: No focal deficit present.     Mental Status: He is alert. Mental status is at baseline.    Assessment & Plan:  1. Constipation, unspecified constipation type Continue Metamucil and start MiraLAX 17 g p.o. daily.  Return to the clinic if persistent constipation and abdominal pain, nausea and vomiting.  2. Hypertension associated with diabetes ([place]) Well-controlled.  Continue current medications  3. Atrial  fibrillation, unspecified type ([place]) I have explained to patient that he is at high risk for stroke due to atrial fibrillation.  I have recommended that he returns to Dr. Olin Pia office and have his loop monitor checked so he can be started on anticoagulation.  His CHA2DS2-VASc Score =2 which places him at moderate to high risk for stroke. Based on current guidelines, he should be on anticoagulation. I Have reviewed the risks and benefits of anticoagulation with him. He is to call Dr Caryl Comes.  4. Type 2 diabetes mellitus with neuropathy (HCC) Pain is well controlled.  Continue current medications  5. Diabetic nephropathy associated with type 2 diabetes mellitus (HCC) Creatinine stable.  Continue current medications.  We will continue to monitor creatinine and GFR  6. Hypertension associated with type 2 diabetes mellitus Well-controlled.  Continue current medications 7.  Obesity obesity. Patient has gained a significant amount of weight.  Weight loss interventions reinforced.  Patient is to return to the clinic in 1 month for weight check.  8.  Depression Related to financial difficulties.  Charity care application given to patient and also his last charity care approval letter given to him.  Given that we have addressed his financial concerns, we will reevaluate in 1 month.  We will hold off on starting him on any antidepressants at this point.  Patient has been advised to return to the clinic or go to the emergency room if he becomes suicidal    Deforest Hoyles, NP   [facility] 1 month

## 2018-11-25 NOTE — Progress Notes (Signed)
Carelink Summary Report / Loop Recorder 

## 2018-11-29 ENCOUNTER — Other Ambulatory Visit: Payer: Self-pay | Admitting: Internal Medicine

## 2018-12-02 ENCOUNTER — Other Ambulatory Visit: Payer: Self-pay | Admitting: Internal Medicine

## 2018-12-17 ENCOUNTER — Telehealth: Payer: Self-pay

## 2018-12-17 NOTE — Telephone Encounter (Signed)
I asked the patient to send a manual transmission. The pt agreed. I told him when the nurse take a look at it she will give him a call back.

## 2018-12-17 NOTE — Telephone Encounter (Signed)
Transmission reviewed. 1 AF episode, duration 3 hours 54 minutes. True AF. Not currently on [place]. Routed to Dr Caryl Comes for review.

## 2018-12-20 ENCOUNTER — Ambulatory Visit (INDEPENDENT_AMBULATORY_CARE_PROVIDER_SITE_OTHER): Payer: Self-pay | Admitting: *Deleted

## 2018-12-20 ENCOUNTER — Other Ambulatory Visit: Payer: Self-pay

## 2018-12-20 DIAGNOSIS — R002 Palpitations: Secondary | ICD-10-CM

## 2018-12-20 LAB — CUP PACEART REMOTE DEVICE CHECK
Date Time Interrogation Session: 20200406133614
Implantable Pulse Generator Implant Date: 20191024

## 2018-12-20 NOTE — Progress Notes (Signed)
  Medication Management Clinic Visit Note  Patient: Steve Buck. MRN: [id number] Date of Birth: 1955/10/12 PCP: Default, Provider, MD   [name] 63 y.o. male presents for a Medication Management visit today.  There were no vitals taken for this visit.  Patient Information   Past Medical History:  Diagnosis Date  . (HFpEF) heart failure with preserved ejection fraction ([place])    a. 12/2017 Echo: EF 60-65%, no rwma, Gr1DD, mild MR, mildly dil LA, nl RV fxn.  . Allergy    Seasonal  . Chest pain    a. 12/2017 Lexiscan MV: EF 37% (nl by echo). Small mild, fixed region of apical thinning, likely attenuation artifact. No ischemia. Low risk.  . CKD (chronic kidney disease) stage 3, GFR 30-59 ml/min (HCC) 03/30/2018  . Diabetic nephropathy associated with type 2 diabetes mellitus ([place]) 03/25/2018  . DKA (diabetic ketoacidoses) ([place]) 12/01/2017  . Heart murmur    a. 12/2017 Echo: Mild MR.  Marland Kitchen History of Bell's palsy 03/30/2018  . Hypertension   . Morbid obesity ([place])   . Palpitations   . PVC's (premature ventricular contractions)    a. 05/2018 Holter: RSR, avg rate of 60. Freq PVC's (6.4% burden). Some ventricular bigeminy/trigeminy.  . Type 2 diabetes mellitus, uncontrolled, with neuropathy ([place])       Past Surgical History:  Procedure Laterality Date  . colonoscvopy  2015  . FOOT SURGERY  around 1994  . LOOP RECORDER INSERTION N/A 07/08/2018   Procedure: LOOP RECORDER INSERTION;  Surgeon: Deboraha Sprang, MD;  Location: [facility];  Service: Cardiovascular;  Laterality: N/A;     Family History  Problem Relation Age of Onset  . Cancer Mother        Skin  . CAD Father   . Arthritis Father               Social History   Substance and Sexual Activity  Alcohol Use Not Currently      Social History   Tobacco Use  Smoking Status Never Smoker  Smokeless Tobacco Never Used      Health Maintenance  Topic Date Due  . Hepatitis C Screening   May 08, 1956  . FOOT EXAM  04/26/1964  . HIV Screening  04/26/1969  . TETANUS/TDAP  04/26/1973  . COLONOSCOPY  04/26/2004  . OPHTHALMOLOGY EXAM  03/26/2019  . INFLUENZA VACCINE  04/16/2019  . HEMOGLOBIN A1C  05/12/2019     Assessment and Plan: 1. Diabetes (metformin): Checks fasting level everyday, readings between 95-105, which is at goal. Last A1c was 6.3 in 10/2018. Patient has been on insulin, but no longer is.  2. Gout (allopurinol), CHF, HLD (atorvastatin), HTN (HCTZ, metoprolol) neuropathies: no current issues. Unsure if patient truly has CHF due to medication regimen. Could benefit from high intensity statin. Estimated ASCVD risk ~29.5% (however, outside of the age window for risk estimation).  3. Atrial fibrillation: patient is not currently on anticoagulation, but it appears this was addressed by cardiology in 06/2018 as it was secondary to diabetes/HHS. Patient has loop recorder.  He rarely misses doses of medications, and does use a pill box. Goes to PCP on Thursday, plans to get prescriptions for medications he has no refills on and call in others. Encounter was limited by telephone call.   Paticia Stack, PharmD Pharmacy Resident  12/20/2018 2:15 PM

## 2018-12-21 ENCOUNTER — Ambulatory Visit: Payer: Self-pay | Admitting: Pharmacist

## 2018-12-23 ENCOUNTER — Ambulatory Visit: Payer: Self-pay | Admitting: Adult Health

## 2018-12-27 NOTE — Telephone Encounter (Signed)
Agree  Can we arrnge AF telehealth visit to discuss anticoagulation   If he has further questions I am glad to call him

## 2018-12-27 NOTE — Progress Notes (Signed)
Established Patient Office Visit  Subjective:  Patient ID: Steve Bartmess., male    DOB: [birth year]  Age: 63 y.o. MRN: [id number]  CC:  Chief Complaint  Patient presents with  . Follow-up    high blood pressure, DM    HPI Steve Buck. presents for follow up for T2DM with neuropathy, hypertension , atrial fibrillation and abdominal bloating.  Patient consents to telephone visit and 2 identifier was used to identify patient.  Abdominal bloating: He states that he continues to experience lower abdominal bloating every morning when he wakes up. He reports taking metamucil daily and Miralax when he needs it. He reports moving his bowel every morning and his stool is soft, but he strains to start.  He reports having relief 1 hour after moving his bowel. He denies diarrhea, nausea and vomiting.  T2DM with Neuropathy: Last HgbA1c was 6.3 %, Currently on 500 mg metformin bid. He monitors his blood glucose once daily and his pre breakfast reading today was 92 mg/dl. He denies hypoglycemia/ hyperglycemic symptoms. He reports being compliant with ADA diet, exercises 20-30 minutes per day and performs daily foot checks. He reports that his lower extremity peripheral neuropathy is worsening during the day, that it burns and tingles most of the time. He denies any fall.  Hypertension: He reports that he takes his medications as prescribed and checks his blood pressure daily at home.  He states that his SBP usually runs in the low 160's and DBP in the mid 90's to low 100's before taking his medications, and it comes down to 140- 150/70-80 after taking his medication. He states that he is compliant with low sodium diet and exercise at least 20- 30 minutes daily.  [place]-Fibrilation: He states that he still has the loop monitor and experiences intermittent Afib spells. He will follow up with his Cardiologist Dr Caryl Comes today. He reports that he's in a good mood, denies homicidal or suicidal  ideation. He will submit his charity care application this week so that his medical bills will be taking care off. He denies fever, chills, shortness of breath, palpitation and no further concerns.      Past Medical History:  Diagnosis Date  . (HFpEF) heart failure with preserved ejection fraction ([place])    a. 12/2017 Echo: EF 60-65%, no rwma, Gr1DD, mild MR, mildly dil LA, nl RV fxn.  . Allergy    Seasonal  . Chest pain    a. 12/2017 Lexiscan MV: EF 37% (nl by echo). Small mild, fixed region of apical thinning, likely attenuation artifact. No ischemia. Low risk.  . CKD (chronic kidney disease) stage 3, GFR 30-59 ml/min (HCC) 03/30/2018  . Diabetic nephropathy associated with type 2 diabetes mellitus ([place]) 03/25/2018  . DKA (diabetic ketoacidoses) ([place]) 12/01/2017  . Heart murmur    a. 12/2017 Echo: Mild MR.  Marland Kitchen History of Bell's palsy 03/30/2018  . Hypertension   . Morbid obesity ([place])   . Palpitations   . PVC's (premature ventricular contractions)    a. 05/2018 Holter: RSR, avg rate of 60. Freq PVC's (6.4% burden). Some ventricular bigeminy/trigeminy.  . Type 2 diabetes mellitus, uncontrolled, with neuropathy ([place])     Past Surgical History:  Procedure Laterality Date  . colonoscvopy  2015  . FOOT SURGERY  around 1994  . LOOP RECORDER INSERTION N/A 07/08/2018   Procedure: LOOP RECORDER INSERTION;  Surgeon: Deboraha Sprang, MD;  Location: [facility];  Service: Cardiovascular;  Laterality: N/A;  Family History  Problem Relation Age of Onset  . Cancer Mother        Skin  . CAD Father   . Arthritis Father     Social History   Socioeconomic History  . Marital status: Married    Spouse name: Not on file  . Number of children: 3  . Years of education: Not on file  . Highest education level: Not on file  Occupational History  . Occupation: retired    Comment: Delivery driver-produce  Social Needs  . Financial resource strain: Somewhat hard  . Food insecurity:     Worry: Never true    Inability: Never true  . Transportation needs:    Medical: No    Non-medical: No  Tobacco Use  . Smoking status: Never Smoker  . Smokeless tobacco: Never Used  Substance and Sexual Activity  . Alcohol use: Not Currently  . Drug use: Not Currently    Types: Marijuana    Comment: Every day user  . Sexual activity: Not on file  Lifestyle  . Physical activity:    Days per week: 5 days    Minutes per session: 20 min  . Stress: To some extent  Relationships  . Social connections:    Talks on phone: Three times a week    Gets together: Twice a week    Attends religious service: More than 4 times per year    Active member of club or organization: No    Attends meetings of clubs or organizations: Never    Relationship status: Married  . Intimate partner violence:    Fear of current or ex partner: No    Emotionally abused: No    Physically abused: No    Forced sexual activity: No  Other Topics Concern  . Not on file  Social History Narrative   Pt said that everything was going well.    Said the main stress for him is paying for medical care, but things are going well here.   No alcohol use noted.  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    Outpatient Medications Prior to Visit  Medication Sig Dispense Refill  . amLODipine (NORVASC) 5 MG tablet Take 1 tablet (5 mg total) by mouth daily. 180 tablet 3  . aspirin EC 81 MG tablet Take 1 tablet (81 mg total) by mouth daily. 90 tablet 3  . cloNIDine (CATAPRES) 0.3 MG tablet Take 1 tablet (0.3 mg total) by mouth 2 (two) times daily. 180 tablet 3  . glucose blood test strip 1 each by Other route 3 (three) times daily. Use as instructed 300 each 4  . hydrocortisone 2.5 % ointment Apply topically 2 (two) times daily. (Patient taking differently: Apply topically 2 (two) times daily. prn) 30 g 3  . ibuprofen (ADVIL,MOTRIN) 200 MG tablet Take 400 mg by mouth every 8 (eight) hours as needed (for pain.).      Marland Kitchen Lancets MISC 1 each by Does not apply route 3 (three) times daily. 300 each 4  . losartan (COZAAR) 50 MG tablet Take 1 tablet (50 mg total) by mouth at bedtime. 90 tablet 2  . metoprolol tartrate (LOPRESSOR) 25 MG tablet TAKE ONE TABLET BY MOUTH 2 TIMES A DAY 180 tablet 0  . polyethylene glycol powder (GLYCOLAX/MIRALAX) powder Take 17 g by mouth daily. 255 g 3  . psyllium (METAMUCIL) 58.6 % powder Take 1 packet by mouth 2 (two) times daily.     Marland Kitchen allopurinol (  ZYLOPRIM) 100 MG tablet Take 1 tablet (100 mg total) by mouth daily. 90 tablet 3  . atorvastatin (LIPITOR) 10 MG tablet Take 1 tablet (10 mg total) by mouth daily. 90 tablet 3  . gabapentin (NEURONTIN) 100 MG capsule Take 1 capsule (100 mg total) by mouth 2 (two) times daily. One tablet in the morning and one tablet in the afternoon 180 capsule 3  . gabapentin (NEURONTIN) 300 MG capsule Take 1 capsule (300 mg total) by mouth at bedtime. 90 capsule 3  . hydrochlorothiazide (HYDRODIURIL) 25 MG tablet Take 1 tablet (25 mg total) by mouth daily. 90 tablet 3  . metFORMIN (GLUCOPHAGE) 500 MG tablet Take 1 tablet (500 mg total) by mouth 2 (two) times daily with a meal. 180 tablet 3   No facility-administered medications prior to visit.     Allergies  Allergen Reactions  . Lisinopril Rash and Cough    ROS Review of Systems  Constitutional: Negative.   HENT: Negative.   Respiratory: Negative.   Cardiovascular: Negative for chest pain.  Gastrointestinal: Negative for abdominal distention, blood in stool, constipation, diarrhea, nausea and vomiting. Abdominal pain: Abdominal discomfort in the morning..  Genitourinary: Negative.  Negative for scrotal swelling.  Musculoskeletal: Positive for arthralgias (chronic knee pain).  Skin: Negative.   Neurological: Negative.   Psychiatric/Behavioral: Negative.       Objective:    Physical Exam   No vital sign or PE done.  There were no vitals taken for this visit. Wt Readings from Last 3  Encounters:  11/18/18 294 lb (133.4 kg)  09/02/18 184 lb (83.5 kg)  08/05/18 275 lb 12.8 oz (125.1 kg)     Health Maintenance Due  Topic Date Due  . Hepatitis C Screening  18-Jan-1956  . FOOT EXAM  04/26/1964  . HIV Screening  04/26/1969  . TETANUS/TDAP  04/26/1973  . COLONOSCOPY  04/26/2004    There are no preventive care reminders to display for this patient.  Lab Results  Component Value Date   TSH 2.170 05/20/2018   Lab Results  Component Value Date   WBC 8.2 07/22/2018   HGB 13.2 07/22/2018   HCT 38.8 07/22/2018   MCV 90 07/22/2018   PLT 244 07/22/2018   Lab Results  Component Value Date   NA 144 11/11/2018   K 3.8 11/11/2018   CO2 29 11/11/2018   GLUCOSE 83 11/11/2018   BUN 24 11/11/2018   CREATININE 1.46 (H) 11/11/2018   BILITOT 0.5 11/11/2018   ALKPHOS 77 11/11/2018   AST 15 11/11/2018   ALT 18 11/11/2018   PROT 7.2 11/11/2018   ALBUMIN 4.4 11/11/2018   CALCIUM 9.9 11/11/2018   ANIONGAP 10 12/04/2017   Lab Results  Component Value Date   CHOL 156 11/11/2018   Lab Results  Component Value Date   HDL 28 (L) 11/11/2018   Lab Results  Component Value Date   LDLCALC 80 11/11/2018   Lab Results  Component Value Date   TRIG 238 (H) 11/11/2018   Lab Results  Component Value Date   CHOLHDL 5.6 (H) 11/11/2018   Lab Results  Component Value Date   HGBA1C 6.3 (H) 11/11/2018      Assessment & Plan:    1. Abdominal bloating - He was advised to continue Metamucil and Miralax 17 g oral daily. - He will return to clinic for worsening abdominal pain, nausea and vomiting. -- simethicone (GAS-X) 80 MG chewable tablet; Chew 1 tablet (80 mg total) by mouth at bedtime  as needed for flatulence.  Dispense: 30 tablet; Refill: 0  2. Diabetic nephropathy associated with type 2 diabetes mellitus (HCC) - Gabapentin was increased to 200 mg bid and he will continue on 300 mg gabapentin at bedtime. . Use Diabetic diet as advised  . Check blood sugar once  before breakfast . Write down the numbers against date in a log . Bring log to clinic every visit . Take medications regularly as advised . Regular exercise    3. Essential hypertension - He will continue on current medication regimen. . -Low salt DASH diet . Take medications regularly on time . Exercise regularly as tolerated . Check blood pressure daily at home and record, bring log to clinic. . Goal is less than 140/90 and normal blood pressure is 120/80    4. Atrial fibrillation, unspecified type [facility]) - He will follow up with Dr Caryl Comes today    Follow-up: Return in about 1 month (around 01/27/2019), or if symptoms worsen or fail to improve.    Steve Latka Jerold Coombe, NP

## 2018-12-28 ENCOUNTER — Encounter: Payer: Self-pay | Admitting: Gerontology

## 2018-12-28 ENCOUNTER — Ambulatory Visit: Payer: Self-pay | Admitting: Gerontology

## 2018-12-28 ENCOUNTER — Other Ambulatory Visit (HOSPITAL_COMMUNITY): Payer: Self-pay | Admitting: *Deleted

## 2018-12-28 ENCOUNTER — Other Ambulatory Visit: Payer: Self-pay

## 2018-12-28 ENCOUNTER — Ambulatory Visit (HOSPITAL_COMMUNITY)
Admission: RE | Admit: 2018-12-28 | Discharge: 2018-12-28 | Disposition: A | Discharge status: Home / Self Care | Payer: Self-pay | Source: Ambulatory Visit | Attending: Physician Assistant | Admitting: Physician Assistant

## 2018-12-28 ENCOUNTER — Encounter (HOSPITAL_COMMUNITY): Payer: Self-pay | Admitting: Physician Assistant

## 2018-12-28 VITALS — BP 154/94 | Ht 72.0 in | Wt 289.0 lb

## 2018-12-28 DIAGNOSIS — I480 Paroxysmal atrial fibrillation: Secondary | ICD-10-CM

## 2018-12-28 DIAGNOSIS — Z7901 Long term (current) use of anticoagulants: Secondary | ICD-10-CM

## 2018-12-28 DIAGNOSIS — E669 Obesity, unspecified: Secondary | ICD-10-CM

## 2018-12-28 DIAGNOSIS — Z6839 Body mass index (BMI) 39.0-39.9, adult: Secondary | ICD-10-CM

## 2018-12-28 DIAGNOSIS — I10 Essential (primary) hypertension: Secondary | ICD-10-CM

## 2018-12-28 DIAGNOSIS — I4891 Unspecified atrial fibrillation: Secondary | ICD-10-CM

## 2018-12-28 DIAGNOSIS — E1121 Type 2 diabetes mellitus with diabetic nephropathy: Secondary | ICD-10-CM

## 2018-12-28 DIAGNOSIS — R140 Abdominal distension (gaseous): Secondary | ICD-10-CM

## 2018-12-28 MED ORDER — GABAPENTIN 300 MG PO CAPS: 300.0000 mg | ORAL_CAPSULE | Freq: Every day | ORAL | 3 refills | Status: DC

## 2018-12-28 MED ORDER — GABAPENTIN 100 MG PO CAPS: 200.0000 mg | ORAL_CAPSULE | Freq: Two times a day (BID) | ORAL | 3 refills | Status: DC

## 2018-12-28 MED ORDER — METFORMIN HCL 500 MG PO TABS: 500.0000 mg | ORAL_TABLET | Freq: Two times a day (BID) | ORAL | 3 refills | Status: DC

## 2018-12-28 MED ORDER — HYDROCHLOROTHIAZIDE 25 MG PO TABS: 25.0000 mg | ORAL_TABLET | Freq: Every day | ORAL | 3 refills | Status: DC

## 2018-12-28 MED ORDER — ALLOPURINOL 100 MG PO TABS: 100.0000 mg | ORAL_TABLET | Freq: Every day | ORAL | 3 refills | Status: DC

## 2018-12-28 MED ORDER — RIVAROXABAN 20 MG PO TABS: 20.0000 mg | ORAL_TABLET | Freq: Every day | ORAL | 6 refills | Status: DC

## 2018-12-28 MED ORDER — SIMETHICONE 80 MG PO CHEW: 80.0000 mg | CHEWABLE_TABLET | Freq: Every evening | ORAL | 0 refills | Status: DC | PRN

## 2018-12-28 MED ORDER — METOPROLOL TARTRATE 25 MG PO TABS: ORAL_TABLET | ORAL | 2 refills | Status: DC

## 2018-12-28 MED ORDER — ATORVASTATIN CALCIUM 10 MG PO TABS: 10.0000 mg | ORAL_TABLET | Freq: Every day | ORAL | 3 refills | Status: DC

## 2018-12-28 NOTE — Progress Notes (Signed)
Electrophysiology TeleHealth Note   Due to national recommendations of social distancing due to [place] 19, Audio/video telehealth visit is felt to be most appropriate for this patient at this time.  See MyChart message from today for patient consent regarding telehealth for the At[facility] Fibrillation Clinic.    Date:  12/28/2018   ID:  Steve Marvel., DOB [birth year], MRN [id number]  Location: home  Provider location: [address] [place], [place] [address] Evaluation Performed: Follow up  PCP:  Default, Provider, MD  Primary Cardiologist:  Dr Fletcher Anon Primary Electrophysiologist: Dr Caryl Comes   CC: Follow up for atrial fibrillation   History of Present Illness: Steve Buck. is a 63 y.o. male who presents via audio/video conferencing for a telehealth visit today. Patient reports that he continues to have daily palpitations which have been chronic for him. He was noted to have an episode of atrial fibrillation on his ILR which lasted about 4 hours. Patient reports he was unaware of his arrhythmia. There was no associated SOB or fatigue. He denies snoring or significant alcohol use. No precipitating factors identified.  Today, he denies symptoms of chest pain, shortness of breath, orthopnea, PND, lower extremity edema, claudication, dizziness, presyncope, syncope, bleeding, or neurologic sequela. The patient is tolerating medications without difficulties and is otherwise without complaint today.   he denies symptoms of cough, fevers, chills, or new SOB worrisome for COVID 19.     Atrial Fibrillation Risk Factors:  he does not have symptoms or diagnosis of sleep apnea. he does not have a history of rheumatic fever. he does not have a history of alcohol use. The patient does not have a history of early familial atrial fibrillation or other arrhythmias.  he has a BMI of Body mass index is 39.2 kg/m.Marland Kitchen Filed Weights   12/28/18 1310  Weight: 131.1 kg   BP 154/94  Provided by  pts home BP machine Pulse 60 on [facility]   Past Medical History:  Diagnosis Date  . (HFpEF) heart failure with preserved ejection fraction ([place])    a. 12/2017 Echo: EF 60-65%, no rwma, Gr1DD, mild MR, mildly dil LA, nl RV fxn.  . Allergy    Seasonal  . Chest pain    a. 12/2017 Lexiscan MV: EF 37% (nl by echo). Small mild, fixed region of apical thinning, likely attenuation artifact. No ischemia. Low risk.  . CKD (chronic kidney disease) stage 3, GFR 30-59 ml/min (HCC) 03/30/2018  . Diabetic nephropathy associated with type 2 diabetes mellitus ([place]) 03/25/2018  . DKA (diabetic ketoacidoses) ([facility]) 12/01/2017  . Heart murmur    a. 12/2017 Echo: Mild MR.  Marland Kitchen History of Bell's palsy 03/30/2018  . Hypertension   . Morbid obesity ([place])   . Palpitations   . PVC's (premature ventricular contractions)    a. 05/2018 Holter: RSR, avg rate of 60. Freq PVC's (6.4% burden). Some ventricular bigeminy/trigeminy.  . Type 2 diabetes mellitus, uncontrolled, with neuropathy ([place])    Past Surgical History:  Procedure Laterality Date  . colonoscvopy  2015  . FOOT SURGERY  around 1994  . LOOP RECORDER INSERTION N/A 07/08/2018   Procedure: LOOP RECORDER INSERTION;  Surgeon: Deboraha Sprang, MD;  Location: [facility];  Service: Cardiovascular;  Laterality: N/A;     Current Outpatient Medications  Medication Sig Dispense Refill  . allopurinol (ZYLOPRIM) 100 MG tablet Take 1 tablet (100 mg total) by mouth daily. 90 tablet 3  . amLODipine (NORVASC) 5 MG tablet  Take 1 tablet (5 mg total) by mouth daily. 180 tablet 3  . aspirin EC 81 MG tablet Take 1 tablet (81 mg total) by mouth daily. 90 tablet 3  . atorvastatin (LIPITOR) 10 MG tablet Take 1 tablet (10 mg total) by mouth daily. 90 tablet 3  . cloNIDine (CATAPRES) 0.3 MG tablet Take 1 tablet (0.3 mg total) by mouth 2 (two) times daily. 180 tablet 3  . gabapentin (NEURONTIN) 100 MG capsule Take 200 mg by mouth 2 (two) times daily.    Marland Kitchen gabapentin  (NEURONTIN) 300 MG capsule Take 1 capsule (300 mg total) by mouth at bedtime. 90 capsule 3  . glucose blood test strip 1 each by Other route 3 (three) times daily. Use as instructed 300 each 4  . hydrochlorothiazide (HYDRODIURIL) 25 MG tablet Take 1 tablet (25 mg total) by mouth daily. 90 tablet 3  . ibuprofen (ADVIL,MOTRIN) 200 MG tablet Take 400 mg by mouth every 8 (eight) hours as needed (for pain.).     Marland Kitchen losartan (COZAAR) 50 MG tablet Take 1 tablet (50 mg total) by mouth at bedtime. 90 tablet 2  . metFORMIN (GLUCOPHAGE) 500 MG tablet Take 1 tablet (500 mg total) by mouth 2 (two) times daily with a meal. 180 tablet 3  . metoprolol tartrate (LOPRESSOR) 25 MG tablet Take 1 tablet in the AM and 2 tablets in the PM 270 tablet 2  . polyethylene glycol powder (GLYCOLAX/MIRALAX) powder Take 17 g by mouth daily. (Patient taking differently: Take 17 g by mouth as needed. ) 255 g 3  . psyllium (METAMUCIL) 58.6 % powder Take 1 packet by mouth daily.     . simethicone (GAS-X) 80 MG chewable tablet Chew 1 tablet (80 mg total) by mouth at bedtime as needed for flatulence. 30 tablet 0  . hydrocortisone 2.5 % ointment Apply topically 2 (two) times daily. (Patient not taking: Reported on 12/28/2018) 30 g 3  . Lancets MISC 1 each by Does not apply route 3 (three) times daily. (Patient not taking: Reported on 12/28/2018) 300 each 4   No current facility-administered medications for this encounter.     Allergies:   Lisinopril   Social History:  The patient  reports that he has never smoked. He has never used smokeless tobacco. He reports previous alcohol use. He reports previous drug use. Drug: Marijuana.   Family History:  The patient's  family history includes Arthritis in his father; CAD in his father; Cancer in his mother.    ROS:  Please see the history of present illness.   All other systems are personally reviewed and negative.   Exam: Well appearing, alert and conversant, regular work of breathing,   good skin color  Recent Labs: 05/20/2018: TSH 2.170 07/22/2018: Hemoglobin 13.2; Platelets 244 11/11/2018: ALT 18; BUN 24; Creatinine, Ser 1.46; Magnesium 1.9; Potassium 3.8; Sodium 144  personally reviewed    Other studies personally reviewed: Additional studies/ records that were reviewed today include: Epic notes, echocardiogram.  Echocardiogram 12/31/2017 - Left ventricle: The cavity size was normal. There was moderate   concentric hypertrophy. Systolic function was normal. The   estimated ejection fraction was in the range of 60% to 65%. Wall   motion was normal; there were no regional wall motion   abnormalities. Doppler parameters are consistent with abnormal   left ventricular relaxation (grade 1 diastolic dysfunction). - Mitral valve: There was mild regurgitation. - Left atrium: The atrium was mildly dilated. - Right ventricle: Systolic function was normal. -  Pulmonary arteries: Systolic pressure was within the normal   range. LA 35mm    ASSESSMENT AND PLAN:  1. Paroxysmal atrial fibrillation New onset paroxysmal afib noted on ILR. Overall burden 0.3%. Recall had one episode of secondary afib during previous hospitalization for diabetic coma. We discussed his stroke risk as well as the risks and benefits of anticoagulation including Eliquis, Xarelto, Pradaxa, and coumadin. Patient can get Xarelto at the medication management clinic. Start Xarelto 20 mg daily. CrCl >50 mL/min  Noted plan to start flecainide after CTA with FFR. We will defer this until after patient has health insurance (turns 32 in August) as routine ECGs and stress testing would be cost prohibitive at this time.  Will increase metoprolol to 25 mg AM and 50 mg PM. Recall bradycardia at higher doses in the past.  We discussed lifestyle modification including regular physical activity, weight reduction and good BP and blood glucose control.  This patients CHA2DS2-VASc Score and unadjusted Ischemic Stroke  Rate (% per year) is equal to 2.2 % stroke rate/year from a score of 2  Above score calculated as 1 point each if present [CHF, HTN, DM, Vascular=MI/PAD/Aortic Plaque, Age if 65-74, or Male] Above score calculated as 2 points each if present [Age > 75, or Stroke/TIA/TE]  2. HTN BP elevated today. Patient admits it has been running higher the last few weeks. Medication changes as above.  3. Obesity Body mass index is 39.2 kg/m. Lifestyle modification as above. Patient admits he has not been as physically active since COVID-19 pandemic.   COVID screen The patient does not have any symptoms that suggest any further testing/ screening at this time.  Social distancing reinforced today.    Follow-up with Dr Caryl Comes as scheduled.   Current medicines are reviewed at length with the patient today.   The patient does not have concerns regarding his medicines.  The following changes were made today:  Increase metoprolol, start Xarelto.  Labs/ tests ordered today include:  No orders of the defined types were placed in this encounter.   Patient Risk:  after full review of this patients clinical status, I feel that they are at moderate risk at this time.   Today, I have spent 19 minutes with the patient with telehealth technology discussing atrial fibrillation, anticoagulation, lifestyle changes, and COVID-19 precautions.    Gwenlyn Perking PA-C 12/28/2018 2:43 PM  [facility] [address] [place], [place] [address] [phone]

## 2018-12-28 NOTE — Progress Notes (Signed)
Carelink Summary Report / Loop Recorder 

## 2018-12-28 NOTE — Telephone Encounter (Signed)
Pt has agreed to telehealth visit with AF clinic today at 1:30pm. Marzetta Board, RN at AF clinic is aware and will contact him with further info at the time of his visit.

## 2019-01-24 ENCOUNTER — Other Ambulatory Visit: Payer: Self-pay

## 2019-01-24 ENCOUNTER — Ambulatory Visit (INDEPENDENT_AMBULATORY_CARE_PROVIDER_SITE_OTHER): Payer: Self-pay | Admitting: *Deleted

## 2019-01-24 DIAGNOSIS — R002 Palpitations: Secondary | ICD-10-CM

## 2019-01-24 DIAGNOSIS — I480 Paroxysmal atrial fibrillation: Secondary | ICD-10-CM

## 2019-01-24 LAB — CUP PACEART REMOTE DEVICE CHECK
Date Time Interrogation Session: 20200509170046
Implantable Pulse Generator Implant Date: 20191024

## 2019-02-01 NOTE — Progress Notes (Unsigned)
No letter needed 

## 2019-02-01 NOTE — Progress Notes (Signed)
Carelink Summary Report / Loop Recorder 

## 2019-02-03 ENCOUNTER — Encounter: Payer: Self-pay | Admitting: Adult Health

## 2019-02-03 ENCOUNTER — Ambulatory Visit: Payer: Self-pay | Admitting: Adult Health

## 2019-02-03 ENCOUNTER — Other Ambulatory Visit: Payer: Self-pay

## 2019-02-03 DIAGNOSIS — I10 Essential (primary) hypertension: Secondary | ICD-10-CM

## 2019-02-03 DIAGNOSIS — IMO0002 Reserved for concepts with insufficient information to code with codable children: Secondary | ICD-10-CM

## 2019-02-03 DIAGNOSIS — I4891 Unspecified atrial fibrillation: Secondary | ICD-10-CM

## 2019-02-03 DIAGNOSIS — N183 Chronic kidney disease, stage 3 unspecified: Secondary | ICD-10-CM

## 2019-02-03 DIAGNOSIS — E1159 Type 2 diabetes mellitus with other circulatory complications: Secondary | ICD-10-CM

## 2019-02-03 DIAGNOSIS — E1121 Type 2 diabetes mellitus with diabetic nephropathy: Secondary | ICD-10-CM

## 2019-02-03 DIAGNOSIS — E1140 Type 2 diabetes mellitus with diabetic neuropathy, unspecified: Secondary | ICD-10-CM

## 2019-02-03 MED ORDER — PSYLLIUM 58.6 % PO POWD: 1.0000 | Freq: Every day | ORAL | 2 refills | Status: DC

## 2019-02-03 MED ORDER — LANCETS MISC: 1.0000 | Freq: Three times a day (TID) | 4 refills | Status: DC

## 2019-02-03 MED ORDER — GABAPENTIN 300 MG PO CAPS: 300.0000 mg | ORAL_CAPSULE | Freq: Every day | ORAL | 3 refills | Status: DC

## 2019-02-03 MED ORDER — POLYETHYLENE GLYCOL 3350 17 GM/SCOOP PO POWD: 17.0000 g | ORAL | 2 refills | Status: DC | PRN

## 2019-02-03 MED ORDER — ACETAMINOPHEN 500 MG PO TABS: 1000.0000 mg | ORAL_TABLET | Freq: Three times a day (TID) | ORAL | 2 refills | Status: AC | PRN

## 2019-02-03 MED ORDER — AMLODIPINE BESYLATE 5 MG PO TABS: 5.0000 mg | ORAL_TABLET | Freq: Every day | ORAL | 3 refills | Status: DC

## 2019-02-03 MED ORDER — ATORVASTATIN CALCIUM 10 MG PO TABS: 10.0000 mg | ORAL_TABLET | Freq: Every day | ORAL | 3 refills | Status: DC

## 2019-02-03 MED ORDER — RIVAROXABAN 20 MG PO TABS: 20.0000 mg | ORAL_TABLET | Freq: Every day | ORAL | 6 refills | Status: DC

## 2019-02-03 MED ORDER — VITAMIN B-12 1000 MCG PO TABS: 1000.0000 ug | ORAL_TABLET | Freq: Every day | ORAL | 2 refills | Status: DC

## 2019-02-03 MED ORDER — LOSARTAN POTASSIUM 50 MG PO TABS: 50.0000 mg | ORAL_TABLET | Freq: Every day | ORAL | 2 refills | Status: DC

## 2019-02-03 MED ORDER — METOPROLOL TARTRATE 25 MG PO TABS: ORAL_TABLET | ORAL | 2 refills | Status: DC

## 2019-02-03 MED ORDER — VITAMIN D 50 MCG (2000 UT) PO TABS: 2000.0000 [IU] | ORAL_TABLET | Freq: Every day | ORAL | 2 refills | Status: DC

## 2019-02-03 MED ORDER — METFORMIN HCL 500 MG PO TABS: 500.0000 mg | ORAL_TABLET | Freq: Two times a day (BID) | ORAL | 3 refills | Status: DC

## 2019-02-03 MED ORDER — HYDROCHLOROTHIAZIDE 25 MG PO TABS: 25.0000 mg | ORAL_TABLET | Freq: Every day | ORAL | 3 refills | Status: DC

## 2019-02-03 MED ORDER — GLUCOSE BLOOD VI STRP: 1.0000 | ORAL_STRIP | Freq: Three times a day (TID) | 4 refills | Status: AC

## 2019-02-03 MED ORDER — ALLOPURINOL 100 MG PO TABS: 100.0000 mg | ORAL_TABLET | Freq: Every day | ORAL | 3 refills | Status: DC

## 2019-02-03 MED ORDER — GABAPENTIN 100 MG PO CAPS: 200.0000 mg | ORAL_CAPSULE | Freq: Two times a day (BID) | ORAL | 2 refills | Status: DC

## 2019-02-03 MED ORDER — CLONIDINE HCL 0.3 MG PO TABS: 0.3000 mg | ORAL_TABLET | Freq: Two times a day (BID) | ORAL | 3 refills | Status: AC

## 2019-02-03 NOTE — Progress Notes (Signed)
Patient: Steve Buck. Male    DOB: 1956/02/03   63 y.o.   MRN: [id number] Visit Date: 02/03/2019  Today's Provider: Deforest Hoyles, NP   Chief Complaint  Patient presents with  . Follow-up   Subjective:    HPI 63 year old male seen for follow-up of type 2 diabetes with neuropathy, hypertension associated with diabetes, atrial fibrillation, and CKD stage II.  He was seen on December 28, 2018 with complaints of abdominal bloating and constipation.  He reports improvement of symptoms with Metamucil and MiraLAX.  Today his main complaint is severe diabetic neuropathy that is interfering with his ability to sleep and perform his ADLs.  He reports pain as sharp, ranging in intensity from 6-10, occurring all the time, and associated with burning and tingling of his lower extremities.  He is currently on gabapentin 100 mg twice a day and 300 mg at bedtime.  His dose was increased last time but he has not started taking the new dose because he was not sure if the prescription was sent.  He denies any lower extremity ulcers.  He reports performing foot inspections daily.  He is also complaining of difficulty falling asleep and staying asleep part of which he attributes to his neuropathic pain.  His blood glucose levels are within 90 to 105 mg/dL in the morning.  He does not check his blood sugars in the evening.  He reports adhering to a low concentrated sweets diet but has not been exercising as much as he would love to.  He denies chest pain, palpitations, dizziness and headache.  He was seen by cardiology and started on anticoagulation for atrial fibrillation.  He is on Xarelto 20 mg daily.  He denies any bruising or bleeding.  His blood pressure is well controlled.  He reports checking his blood pressure at least 3 times a week.  Otherwise patient offers no other complaints.   Allergies  Allergen Reactions  . Lisinopril Rash and Cough   Previous Medications   ALLOPURINOL (ZYLOPRIM) 100 MG  TABLET    Take 1 tablet (100 mg total) by mouth daily.   AMLODIPINE (NORVASC) 5 MG TABLET    Take 1 tablet (5 mg total) by mouth daily.   ATORVASTATIN (LIPITOR) 10 MG TABLET    Take 1 tablet (10 mg total) by mouth daily.   CLONIDINE (CATAPRES) 0.3 MG TABLET    Take 1 tablet (0.3 mg total) by mouth 2 (two) times daily.   GABAPENTIN (NEURONTIN) 100 MG CAPSULE    Take 200 mg by mouth 2 (two) times daily.   GABAPENTIN (NEURONTIN) 300 MG CAPSULE    Take 1 capsule (300 mg total) by mouth at bedtime.   GLUCOSE BLOOD TEST STRIP    1 each by Other route 3 (three) times daily. Use as instructed   HYDROCHLOROTHIAZIDE (HYDRODIURIL) 25 MG TABLET    Take 1 tablet (25 mg total) by mouth daily.   HYDROCORTISONE 2.5 % OINTMENT    Apply topically 2 (two) times daily.   IBUPROFEN (ADVIL,MOTRIN) 200 MG TABLET    Take 400 mg by mouth every 8 (eight) hours as needed (for pain.).    LANCETS MISC    1 each by Does not apply route 3 (three) times daily.   LOSARTAN (COZAAR) 50 MG TABLET    Take 1 tablet (50 mg total) by mouth at bedtime.   METFORMIN (GLUCOPHAGE) 500 MG TABLET    Take 1 tablet (500 mg total) by mouth 2 (two) times  daily with a meal.   METOPROLOL TARTRATE (LOPRESSOR) 25 MG TABLET    Take 1 tablet in the AM and 2 tablets in the PM   POLYETHYLENE GLYCOL POWDER (GLYCOLAX/MIRALAX) POWDER    Take 17 g by mouth daily.   PSYLLIUM (METAMUCIL) 58.6 % POWDER    Take 1 packet by mouth daily.    RIVAROXABAN (XARELTO) 20 MG TABS TABLET    Take 1 tablet (20 mg total) by mouth daily with supper.   SIMETHICONE (GAS-X) 80 MG CHEWABLE TABLET    Chew 1 tablet (80 mg total) by mouth at bedtime as needed for flatulence.    Review of Systems  Constitutional: Negative.   Respiratory: Negative.   Cardiovascular: Negative.   Endocrine: Negative.   Musculoskeletal: Negative.   Neurological: Positive for numbness (MILD NUMBNESS, tingling and burning in BLLE).    Social History   Tobacco Use  . Smoking status: Never Smoker   . Smokeless tobacco: Never Used  Substance Use Topics  . Alcohol use: Not Currently   Objective:   There were no vitals taken for this visit.  Physical Exam Unable to perform physical exam as this is a tele-visit     Assessment & Plan:  1. Diabetic nephropathy associated with type 2 diabetes mellitus (HCC) No interval changes in urinary output.  Will obtain chemistries and urine microalbuminuria. - Comp Met (CMET); Future  2. Atrial fibrillation, unspecified type ([place]) Heart rate is well controlled.  Patient has been educated on how to monitor his heart rate and to notify the cardiologist for heart rate greater than 100 bpm.  He is metoprolol dose was increased by cardiology to 25 mg in the morning and 50 mg at bedtime.  He reports no symptoms with increased dose.  Continue to monitor and correct electrolytes  - CBC w/Diff; Future - Magnesium; Future - Phosphorus; Future  3. Type 2 diabetes mellitus, uncontrolled, with neuropathy (HCC) Worsening neuropathic pain.  Gabapentin dose already increased.  Patient encouraged to take the current recommended dose of 200 mg twice a day and 300 mg at bedtime.  We will also start patient on vitamin B12 supplementation as well as vitamin D.  Will obtain vitamin B12 and vitamin D levels as well as routine hemoglobin A1c, lipid panel and chemistry.  Patient advised to return to the clinic if worsening symptoms.  Leg exercises reviewed and handouts provided.  4. Hypertension associated with diabetes ([place]) Blood pressures well controlled.  Continue current medications and monitor blood pressure daily at home.  Notify clinic if systolic blood pressures are greater than 150  5. CKD (chronic kidney disease) stage 3, GFR 30-59 ml/min (HCC) Continue to monitor kidney function, monitor electrolytes and will refer to nephrologist for worsening kidney function  6. Morbid obesity ([place]) Lifestyle modification encouraged.   Deforest Hoyles, NP    [facility]

## 2019-02-03 NOTE — Patient Instructions (Addendum)
Diabetes Mellitus and Foot Care  Foot care is an important part of your health, especially when you have diabetes. Diabetes may cause you to have problems because of poor blood flow (circulation) to your feet and legs, which can cause your skin to:   Become thinner and drier.   Break more easily.   Heal more slowly.   Peel and crack.  You may also have nerve damage (neuropathy) in your legs and feet, causing decreased feeling in them. This means that you may not notice minor injuries to your feet that could lead to more serious problems. Noticing and addressing any potential problems early is the best way to prevent future foot problems.  How to care for your feet  Foot hygiene   Wash your feet daily with warm water and mild soap. Do not use hot water. Then, pat your feet and the areas between your toes until they are completely dry. Do not soak your feet as this can dry your skin.   Trim your toenails straight across. Do not dig under them or around the cuticle. File the edges of your nails with an emery board or nail file.   Apply a moisturizing lotion or petroleum jelly to the skin on your feet and to dry, brittle toenails. Use lotion that does not contain alcohol and is unscented. Do not apply lotion between your toes.  Shoes and socks   Wear clean socks or stockings every day. Make sure they are not too tight. Do not wear knee-high stockings since they may decrease blood flow to your legs.   Wear shoes that fit properly and have enough cushioning. Always look in your shoes before you put them on to be sure there are no objects inside.   To break in new shoes, wear them for just a few hours a day. This prevents injuries on your feet.  Wounds, scrapes, corns, and calluses   Check your feet daily for blisters, cuts, bruises, sores, and redness. If you cannot see the bottom of your feet, use a mirror or ask someone for help.   Do not cut corns or calluses or try to remove them with medicine.   If you  find a minor scrape, cut, or break in the skin on your feet, keep it and the skin around it clean and dry. You may clean these areas with mild soap and water. Do not clean the area with peroxide, alcohol, or iodine.   If you have a wound, scrape, corn, or callus on your foot, look at it several times a day to make sure it is healing and not infected. Check for:  ? Redness, swelling, or pain.  ? Fluid or blood.  ? Warmth.  ? Pus or a bad smell.  General instructions   Do not cross your legs. This may decrease blood flow to your feet.   Do not use heating pads or hot water bottles on your feet. They may burn your skin. If you have lost feeling in your feet or legs, you may not know this is happening until it is too late.   Protect your feet from hot and cold by wearing shoes, such as at the beach or on hot pavement.   Schedule a complete foot exam at least once a year (annually) or more often if you have foot problems. If you have foot problems, report any cuts, sores, or bruises to your health care provider immediately.  Contact a health care provider if:     You have a medical condition that increases your risk of infection and you have any cuts, sores, or bruises on your feet.   You have an injury that is not healing.   You have redness on your legs or feet.   You feel burning or tingling in your legs or feet.   You have pain or cramps in your legs and feet.   Your legs or feet are numb.   Your feet always feel cold.   You have pain around a toenail.  Get help right away if:   You have a wound, scrape, corn, or callus on your foot and:  ? You have pain, swelling, or redness that gets worse.  ? You have fluid or blood coming from the wound, scrape, corn, or callus.  ? Your wound, scrape, corn, or callus feels warm to the touch.  ? You have pus or a bad smell coming from the wound, scrape, corn, or callus.  ? You have a fever.  ? You have a red line going up your leg.  Summary   Check your feet every day  for cuts, sores, red spots, swelling, and blisters.   Moisturize feet and legs daily.   Wear shoes that fit properly and have enough cushioning.   If you have foot problems, report any cuts, sores, or bruises to your health care provider immediately.   Schedule a complete foot exam at least once a year (annually) or more often if you have foot problems.  This information is not intended to replace advice given to you by your health care provider. Make sure you discuss any questions you have with your health care provider.  Document Released: 08/29/2000 Document Revised: 10/14/2017 Document Reviewed: 10/03/2016  Elsevier Interactive Patient Education  2019 [employer]

## 2019-02-08 ENCOUNTER — Telehealth: Payer: Self-pay

## 2019-02-08 NOTE — Telephone Encounter (Signed)
-----   Message from Dollene Primrose, RN sent at 02/08/2019  8:22 AM EDT ----- Raquel Sarna,  Could you call and arrange a telehealth visit for this pt on Wed 5/27?  Thanks!  Lorren   ----- Message ----- From: Deboraha Sprang, MD Sent: 02/07/2019   9:33 AM EDT To: Thomasene Ripple, RN  Ladies  good am  This man is scheduled for a recall 6/20   can we schedule that as want to discuss with him the findings of his event recorder  Thanks sk

## 2019-02-08 NOTE — Telephone Encounter (Signed)
I called and spoke with patient, he was ok with telehealth visit tomorrow. Appointment scheduled.      Virtual Visit Pre-Appointment Phone Call  "(Name), I am calling you today to discuss your upcoming appointment. We are currently trying to limit exposure to the virus that causes COVID-19 by seeing patients at home rather than in the office."  1. "What is the BEST phone number to call the day of the visit?" - include this in appointment notes  2. "Do you have or have access to (through a family member/friend) a smartphone with video capability that we can use for your visit?" a. If yes - list this number in appt notes as "cell" (if different from BEST phone #) and list the appointment type as a VIDEO visit in appointment notes b. If no - list the appointment type as a PHONE visit in appointment notes  3. Confirm consent - "In the setting of the current Covid19 crisis, you are scheduled for a (phone or video) visit with your provider on (date) at (time).  Just as we do with many in-office visits, in order for you to participate in this visit, we must obtain consent.  If you'd like, I can send this to your mychart (if signed up) or email for you to review.  Otherwise, I can obtain your verbal consent now.  All virtual visits are billed to your insurance company just like a normal visit would be.  By agreeing to a virtual visit, we'd like you to understand that the technology does not allow for your provider to perform an examination, and thus may limit your provider's ability to fully assess your condition. If your provider identifies any concerns that need to be evaluated in person, we will make arrangements to do so.  Finally, though the technology is pretty good, we cannot assure that it will always work on either your or our end, and in the setting of a video visit, we may have to convert it to a phone-only visit.  In either situation, we cannot ensure that we have a secure connection.  Are you  willing to proceed?" STAFF: Did the patient verbally acknowledge consent to telehealth visit? Document YES/NO here: YES  4. Advise patient to be prepared - "Two hours prior to your appointment, go ahead and check your blood pressure, pulse, oxygen saturation, and your weight (if you have the equipment to check those) and write them all down. When your visit starts, your provider will ask you for this information. If you have an Apple Watch or Kardia device, please plan to have heart rate information ready on the day of your appointment. Please have a pen and paper handy nearby the day of the visit as well."  5. Give patient instructions for MyChart download to smartphone OR Doximity/Doxy.me as below if video visit (depending on what platform provider is using)  6. Inform patient they will receive a phone call 15 minutes prior to their appointment time (may be from unknown caller ID) so they should be prepared to answer    TELEPHONE CALL NOTE  Steve Buck. has been deemed a candidate for a follow-up tele-health visit to limit community exposure during the Covid-19 pandemic. I spoke with the patient via phone to ensure availability of phone/video source, confirm preferred email & phone number, and discuss instructions and expectations.  I reminded Steve Buck. to be prepared with any vital sign and/or heart rhythm information that could potentially be obtained via  home monitoring, at the time of his visit. I reminded Steve Buck. to expect a phone call prior to his visit.  Mady Haagensen, [place] 02/08/2019 9:09 AM   INSTRUCTIONS FOR DOWNLOADING THE MYCHART APP TO SMARTPHONE  - The patient must first make sure to have activated MyChart and know their login information - If Apple, go to [employer] and type in MyChart in the search bar and download the app. If Android, ask patient to go to [employer] and type in [place] in the search bar and download the app. The app is  free but as with any other app downloads, their phone may require them to verify saved payment information or Apple/Android password.  - The patient will need to then log into the app with their MyChart username and password, and select [facility] as their healthcare provider to link the account. When it is time for your visit, go to the MyChart app, find appointments, and click Begin Video Visit. Be sure to Select Allow for your device to access the Microphone and Camera for your visit. You will then be connected, and your provider will be with you shortly.  **If they have any issues connecting, or need assistance please contact MyChart service desk [phone])**  **If using a computer, in order to ensure the best quality for their visit they will need to use either of the following Internet Browsers: [employer], or Google Chrome**  IF USING DOXIMITY or DOXY.ME - The patient will receive a link just prior to their visit by text.     FULL LENGTH CONSENT FOR TELE-HEALTH VISIT   I hereby voluntarily request, consent and authorize [facility] and its employed or contracted physicians, physician assistants, nurse practitioners or other licensed health care professionals (the Practitioner), to provide me with telemedicine health care services (the "Services") as deemed necessary by the treating Practitioner. I acknowledge and consent to receive the Services by the Practitioner via telemedicine. I understand that the telemedicine visit will involve communicating with the Practitioner through live audiovisual communication technology and the disclosure of certain medical information by electronic transmission. I acknowledge that I have been given the opportunity to request an in-person assessment or other available alternative prior to the telemedicine visit and am voluntarily participating in the telemedicine visit.  I understand that I have the right to withhold or withdraw my  consent to the use of telemedicine in the course of my care at any time, without affecting my right to future care or treatment, and that the Practitioner or I may terminate the telemedicine visit at any time. I understand that I have the right to inspect all information obtained and/or recorded in the course of the telemedicine visit and may receive copies of available information for a reasonable fee.  I understand that some of the potential risks of receiving the Services via telemedicine include:  Marland Kitchen Delay or interruption in medical evaluation due to technological equipment failure or disruption; . Information transmitted may not be sufficient (e.g. poor resolution of images) to allow for appropriate medical decision making by the Practitioner; and/or  . In rare instances, security protocols could fail, causing a breach of personal health information.  Furthermore, I acknowledge that it is my responsibility to provide information about my medical history, conditions and care that is complete and accurate to the best of my ability. I acknowledge that Practitioner's advice, recommendations, and/or decision may be based on factors not within their control, such  as incomplete or inaccurate data provided by me or distortions of diagnostic images or specimens that may result from electronic transmissions. I understand that the practice of medicine is not an exact science and that Practitioner makes no warranties or guarantees regarding treatment outcomes. I acknowledge that I will receive a copy of this consent concurrently upon execution via email to the email address I last provided but may also request a printed copy by calling the office of [facility].    I understand that my insurance will be billed for this visit.   I have read or had this consent read to me. . I understand the contents of this consent, which adequately explains the benefits and risks of the Services being provided via telemedicine.   . I have been provided ample opportunity to ask questions regarding this consent and the Services and have had my questions answered to my satisfaction. . I give my informed consent for the services to be provided through the use of telemedicine in my medical care  By participating in this telemedicine visit I agree to the above.

## 2019-02-08 NOTE — Progress Notes (Signed)
Electrophysiology TeleHealth Note   Due to national recommendations of social distancing due to COVID 19, an audio/video telehealth visit is felt to be most appropriate for this patient at this time.  See MyChart message from today for the patient's consent to telehealth for [facility].   Date:  02/09/2019   ID:  Steve Buck., DOB 16-Feb-1956, MRN [id number]  Location: patient's home  Provider location: [address] [place]  Evaluation Performed: Follow-up visit  PCP:  Default, Provider, MD  Cardiologist:     Electrophysiologist:  SK   Chief Complaint:  Dyspnea and palpitations  History of Present Illness:    Steve Buck. is a 63 y.o. male who presents via audio/video conferencing for a telehealth visit today.  Since last being seen in our clinic    the patient reports less problems with his PVCs.  Intermittent atrial fibrillation.  Chronic dyspnea.  He has put on 50 pounds in the last year.  No edema.   Pain in knees and feet.  Melancholy.  Denies suicidality  Sleep disordered breathing and daytime somnolence.  DATE TEST EF   4/19 MYOVIEW   37 % No ischemia  4/19 Echo   60-65 %   12/19 CTA  CaScore 540 -- FFR neg Ao Root 43     The patient denies symptoms of fevers, chills, cough, or new SOB worrisome for COVID 19   Past Medical History:  Diagnosis Date  . (HFpEF) heart failure with preserved ejection fraction ([place])    a. 12/2017 Echo: EF 60-65%, no rwma, Gr1DD, mild MR, mildly dil LA, nl RV fxn.  . Allergy    Seasonal  . Chest pain    a. 12/2017 Lexiscan MV: EF 37% (nl by echo). Small mild, fixed region of apical thinning, likely attenuation artifact. No ischemia. Low risk.  . CKD (chronic kidney disease) stage 3, GFR 30-59 ml/min (HCC) 03/30/2018  . Diabetic nephropathy associated with type 2 diabetes mellitus ([place]) 03/25/2018  . DKA (diabetic ketoacidoses) ([place]) 12/01/2017  . Heart murmur    a. 12/2017 Echo: Mild MR.  Marland Kitchen  History of Bell's palsy 03/30/2018  . Hypertension   . Morbid obesity ([place])   . Palpitations   . PVC's (premature ventricular contractions)    a. 05/2018 Holter: RSR, avg rate of 60. Freq PVC's (6.4% burden). Some ventricular bigeminy/trigeminy.  . Type 2 diabetes mellitus, uncontrolled, with neuropathy ([place])     Past Surgical History:  Procedure Laterality Date  . colonoscvopy  2015  . FOOT SURGERY  around 1994  . LOOP RECORDER INSERTION N/A 07/08/2018   Procedure: LOOP RECORDER INSERTION;  Surgeon: Deboraha Sprang, MD;  Location: [facility];  Service: Cardiovascular;  Laterality: N/A;    Current Outpatient Medications  Medication Sig Dispense Refill  . acetaminophen (TYLENOL) 500 MG tablet Take 2 tablets (1,000 mg total) by mouth every 8 (eight) hours as needed. 100 tablet 2  . allopurinol (ZYLOPRIM) 100 MG tablet Take 1 tablet (100 mg total) by mouth daily. 90 tablet 3  . amLODipine (NORVASC) 5 MG tablet Take 1 tablet (5 mg total) by mouth daily. 180 tablet 3  . atorvastatin (LIPITOR) 10 MG tablet Take 1 tablet (10 mg total) by mouth daily. 90 tablet 3  . Cholecalciferol (VITAMIN D) 50 MCG (2000 UT) tablet Take 1 tablet (2,000 Units total) by mouth daily. 90 tablet 2  . cloNIDine (CATAPRES) 0.3 MG tablet Take 1 tablet (0.3  mg total) by mouth 2 (two) times daily. 180 tablet 3  . gabapentin (NEURONTIN) 100 MG capsule Take 2 capsules (200 mg total) by mouth 2 (two) times daily. 180 capsule 2  . gabapentin (NEURONTIN) 300 MG capsule Take 1 capsule (300 mg total) by mouth at bedtime. 90 capsule 3  . glucose blood test strip 1 each by Other route 3 (three) times daily. Use as instructed 300 each 4  . hydrochlorothiazide (HYDRODIURIL) 25 MG tablet Take 1 tablet (25 mg total) by mouth daily. 90 tablet 3  . Lancets MISC 1 each by Does not apply route 3 (three) times daily. 300 each 4  . losartan (COZAAR) 50 MG tablet Take 1 tablet (50 mg total) by mouth at bedtime. 90 tablet 2  .  metFORMIN (GLUCOPHAGE) 500 MG tablet Take 1 tablet (500 mg total) by mouth 2 (two) times daily with a meal. 180 tablet 3  . metoprolol tartrate (LOPRESSOR) 25 MG tablet Take 1 tablet in the AM and 2 tablets in the PM 270 tablet 2  . polyethylene glycol powder (GLYCOLAX/MIRALAX) 17 GM/SCOOP powder Take 17 g by mouth as needed. 850 g 2  . psyllium (METAMUCIL) 58.6 % powder Take 1 packet by mouth daily. 283 g 2  . rivaroxaban (XARELTO) 20 MG TABS tablet Take 1 tablet (20 mg total) by mouth daily with supper. 30 tablet 6  . vitamin B-12 (CYANOCOBALAMIN) 1000 MCG tablet Take 1 tablet (1,000 mcg total) by mouth daily. 90 tablet 2   No current facility-administered medications for this visit.     Allergies:   Lisinopril   Social History:  The patient  reports that he has never smoked. He has never used smokeless tobacco. He reports previous alcohol use. He reports previous drug use. Drug: Marijuana.   Family History:  The patient's   family history includes Arthritis in his father; CAD in his father; Cancer in his mother.   ROS:  Please see the history of present illness.   All other systems are personally reviewed and negative.    Exam:    Vital Signs:  BP (!) 158/108   Pulse 79   Ht 6' (1.829 m)   Wt 289 lb (131.1 kg)   BMI 39.20 kg/m     Well appearing, alert and conversant, regular work of breathing,  good skin color Eyes- anicteric, neuro- grossly intact, skin- no apparent rash or lesions or cyanosis, mouth- oral mucosa is pink   Labs/Other Tests and Data Reviewed:    Recent Labs: 05/20/2018: TSH 2.170 07/22/2018: Hemoglobin 13.2; Platelets 244 11/11/2018: ALT 18; BUN 24; Creatinine, Ser 1.46; Magnesium 1.9; Potassium 3.8; Sodium 144   Wt Readings from Last 3 Encounters:  02/09/19 289 lb (131.1 kg)  12/28/18 289 lb (131.1 kg)  11/18/18 294 lb (133.4 kg)     Other studies personally reviewed: Additional studies/ records that were reviewed today include     ASSESSMENT &  PLAN:    PVCs left bundle inferior axis with a QS in lead aVR and R wave in lead aVL-delayed intrinsicoid deflection  Hypertension  Atrial fibrillation-secondary recurrent   Obesity  Anxiety/depression  Aortic enlargement  Gynecomastia Left  Interval atrial fibrillation and has been started on Xarelto.  No interval bleeding.  Blood pressures at home have been elevated particularly diastolic.  We will increase his amlodipine from 5--10  Struggling with increased weight.  Spent a long time discussing anxiety/melancholy, rapid increase in weight and the need for exercise.  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  Sleep disordered breathing and daytime somnolence.  Will need a sleep study.  Will defer until after August when he gets his Medicare  Will need aortic root surveillance     COVID 19 screen The patient denies symptoms of COVID 19 at this time.  The importance of social distancing was discussed today.  Follow-up:  4 m We will plan a sleep study for September    Current medicines are reviewed at length with the patient today.   The patient does not have concerns regarding his medicines.  The following changes were made today:   Increase amlodipine 5--10  Labs/ tests ordered today include:  No orders of the defined types were placed in this encounter.   Future tests ( post COVID )     Patient Risk:  after full review of this patients clinical status, I feel that they are at moderate  risk at this time.  Today, I have spent 28 minutes with the patient with telehealth technology discussing the above.  Signed, Virl Axe, MD  02/09/2019 9:59 AM     [facility] [address] [place] [address] [phone] (office) [phone] (fax)

## 2019-02-09 ENCOUNTER — Encounter: Payer: Self-pay | Admitting: Internal Medicine

## 2019-02-09 ENCOUNTER — Telehealth (INDEPENDENT_AMBULATORY_CARE_PROVIDER_SITE_OTHER): Payer: Self-pay | Admitting: Internal Medicine

## 2019-02-09 ENCOUNTER — Other Ambulatory Visit: Payer: Self-pay

## 2019-02-09 VITALS — BP 158/108 | HR 79 | Ht 72.0 in | Wt 289.0 lb

## 2019-02-09 DIAGNOSIS — Z604 Social exclusion and rejection: Secondary | ICD-10-CM

## 2019-02-09 DIAGNOSIS — I10 Essential (primary) hypertension: Secondary | ICD-10-CM

## 2019-02-09 DIAGNOSIS — I480 Paroxysmal atrial fibrillation: Secondary | ICD-10-CM

## 2019-02-09 DIAGNOSIS — I493 Ventricular premature depolarization: Secondary | ICD-10-CM

## 2019-02-09 DIAGNOSIS — R0602 Shortness of breath: Secondary | ICD-10-CM

## 2019-02-09 DIAGNOSIS — F419 Anxiety disorder, unspecified: Secondary | ICD-10-CM

## 2019-02-09 DIAGNOSIS — F329 Major depressive disorder, single episode, unspecified: Secondary | ICD-10-CM

## 2019-02-09 DIAGNOSIS — F32A Depression, unspecified: Secondary | ICD-10-CM

## 2019-02-09 MED ORDER — AMLODIPINE BESYLATE 10 MG PO TABS: 10.0000 mg | ORAL_TABLET | Freq: Every day | ORAL | 3 refills | Status: DC

## 2019-02-09 NOTE — Patient Instructions (Signed)
Called and discussed the following recommendations with pt. He verbalized understanding and had no additional questions.  Medication Instructions:  Your physician has recommended you make the following change in your medication:  Increase your Amlodipine to 5mg , two tablets of your current supply, once daily  Labwork: None ordered.   Testing/Procedures: None ordered.   Follow-Up:  You have a follow up appointment made with Dr Caryl Comes on Sept 24 at 1:45pm  Any Other Special Instructions Will Be Listed Below (If Applicable).     If you need a refill on your cardiac medications before your next appointment, please call your pharmacy.

## 2019-02-25 ENCOUNTER — Ambulatory Visit (INDEPENDENT_AMBULATORY_CARE_PROVIDER_SITE_OTHER): Payer: Self-pay | Admitting: *Deleted

## 2019-02-25 DIAGNOSIS — R002 Palpitations: Secondary | ICD-10-CM

## 2019-02-25 LAB — CUP PACEART REMOTE DEVICE CHECK
Date Time Interrogation Session: 20200611174022
Implantable Pulse Generator Implant Date: 20191024

## 2019-02-28 NOTE — Progress Notes (Signed)
Carelink Summary Report / Loop Recorder 

## 2019-03-04 ENCOUNTER — Telehealth: Payer: Self-pay | Admitting: Internal Medicine

## 2019-03-08 ENCOUNTER — Other Ambulatory Visit: Payer: Self-pay

## 2019-03-08 DIAGNOSIS — M1A9XX0 Chronic gout, unspecified, without tophus (tophi): Secondary | ICD-10-CM

## 2019-03-09 ENCOUNTER — Other Ambulatory Visit: Payer: Self-pay

## 2019-03-10 ENCOUNTER — Ambulatory Visit: Payer: Self-pay | Admitting: Adult Health

## 2019-03-10 ENCOUNTER — Encounter: Payer: Self-pay | Admitting: Gerontology

## 2019-03-10 ENCOUNTER — Ambulatory Visit: Payer: Self-pay | Admitting: Gerontology

## 2019-03-10 ENCOUNTER — Other Ambulatory Visit: Payer: Self-pay

## 2019-03-10 DIAGNOSIS — M25862 Other specified joint disorders, left knee: Secondary | ICD-10-CM | POA: Insufficient documentation

## 2019-03-10 DIAGNOSIS — M109 Gout, unspecified: Secondary | ICD-10-CM | POA: Insufficient documentation

## 2019-03-10 MED ORDER — PREDNISONE 5 MG PO TABS: ORAL_TABLET | ORAL | 0 refills | Status: DC

## 2019-03-10 NOTE — Progress Notes (Signed)
Established Patient Office Visit  Subjective:  Patient ID: Steve Grosso., male    DOB: 14-Aug-1956  Age: 63 y.o. MRN: [id number]  CC:  Chief Complaint  Patient presents with  . Follow-up    Gout in L foot and L knee  Patient consents to telephone visit and 2 patient identifiers was used to identify patient.  HPI Steve Buck. has a history of type 2 diabetes with neuropathy,  hypertension associated with diabetes, atrial fibrillation, and CKD stage II who presents today for gout exacerbation to left 5th toe and foot that started 5 days ago. He states that his left fifth toe, lateral aspect of his left foot and ankle are swollen, erythematous, warm and tender when touched. He reports experiencing intense constant non radiating 10/10 pain to the left foot, and he is unable to bear weight and uses crutches when walking. He has diabetic neuropathy and takes 200 mg gabapentin bid, 300 mg at bedtime and 100 mg of allopurinol daily. He reports that he has taking ibuprofen, cherry capsule with minimal relief. He denies consuming high purine diet. He equally complained of swelling and tenderness around the cyst below his left kneecap which has being going on for 5 days. He states that the cyst has being on his kneecap since childhood. He reports that the pain is sharp and constant, it's intensity increases from 5-8, and he's unable to bend his knee completely.  He denies any trauma, fever, chills and no further concerns.  Past Medical History:  Diagnosis Date  . (HFpEF) heart failure with preserved ejection fraction ([place])    a. 12/2017 Echo: EF 60-65%, no rwma, Gr1DD, mild MR, mildly dil LA, nl RV fxn.  . Allergy    Seasonal  . Chest pain    a. 12/2017 Lexiscan MV: EF 37% (nl by echo). Small mild, fixed region of apical thinning, likely attenuation artifact. No ischemia. Low risk.  . CKD (chronic kidney disease) stage 3, GFR 30-59 ml/min (HCC) 03/30/2018  . Diabetic nephropathy  associated with type 2 diabetes mellitus ([place]) 03/25/2018  . DKA (diabetic ketoacidoses) ([place]) 12/01/2017  . Heart murmur    a. 12/2017 Echo: Mild MR.  Marland Kitchen History of Bell's palsy 03/30/2018  . Hypertension   . Morbid obesity ([place])   . Palpitations   . PVC's (premature ventricular contractions)    a. 05/2018 Holter: RSR, avg rate of 60. Freq PVC's (6.4% burden). Some ventricular bigeminy/trigeminy.  . Type 2 diabetes mellitus, uncontrolled, with neuropathy ([place])     Past Surgical History:  Procedure Laterality Date  . colonoscvopy  2015  . FOOT SURGERY  around 1994  . LOOP RECORDER INSERTION N/A 07/08/2018   Procedure: LOOP RECORDER INSERTION;  Surgeon: Deboraha Sprang, MD;  Location: [facility];  Service: Cardiovascular;  Laterality: N/A;    Family History  Problem Relation Age of Onset  . Cancer Mother        Skin  . CAD Father   . Arthritis Father     Social History   Socioeconomic History  . Marital status: Married    Spouse name: Not on file  . Number of children: 3  . Years of education: Not on file  . Highest education level: Not on file  Occupational History  . Occupation: retired    Comment: Delivery driver-produce  Social Needs  . Financial resource strain: Somewhat hard  . Food insecurity    Worry: Never true    Inability:  Never true  . Transportation needs    Medical: No    Non-medical: No  Tobacco Use  . Smoking status: Never Smoker  . Smokeless tobacco: Never Used  Substance and Sexual Activity  . Alcohol use: Not Currently  . Drug use: Yes    Types: Marijuana    Comment: occasionally  . Sexual activity: Not on file  Lifestyle  . Physical activity    Days per week: 5 days    Minutes per session: 20 min  . Stress: To some extent  Relationships  . Social Herbalist on phone: Three times a week    Gets together: Twice a week    Attends religious service: More than 4 times per year    Active member of club or organization: No     Attends meetings of clubs or organizations: Never    Relationship status: Married  . Intimate partner violence    Fear of current or ex partner: No    Emotionally abused: No    Physically abused: No    Forced sexual activity: No  Other Topics Concern  . Not on file  Social History Narrative   Pt said that everything was going well.    Said the main stress for him is paying for medical care, but things are going well here.   No alcohol use noted.  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    Outpatient Medications Prior to Visit  Medication Sig Dispense Refill  . allopurinol (ZYLOPRIM) 100 MG tablet Take 1 tablet (100 mg total) by mouth daily. 90 tablet 3  . amLODipine (NORVASC) 10 MG tablet Take 1 tablet (10 mg total) by mouth daily. 90 tablet 3  . atorvastatin (LIPITOR) 10 MG tablet Take 1 tablet (10 mg total) by mouth daily. 90 tablet 3  . cloNIDine (CATAPRES) 0.3 MG tablet Take 1 tablet (0.3 mg total) by mouth 2 (two) times daily. 180 tablet 3  . gabapentin (NEURONTIN) 100 MG capsule Take 2 capsules (200 mg total) by mouth 2 (two) times daily. 180 capsule 2  . gabapentin (NEURONTIN) 300 MG capsule Take 1 capsule (300 mg total) by mouth at bedtime. 90 capsule 3  . glucose blood test strip 1 each by Other route 3 (three) times daily. Use as instructed 300 each 4  . hydrochlorothiazide (HYDRODIURIL) 25 MG tablet Take 1 tablet (25 mg total) by mouth daily. 90 tablet 3  . Lancets MISC 1 each by Does not apply route 3 (three) times daily. 300 each 4  . losartan (COZAAR) 50 MG tablet Take 1 tablet (50 mg total) by mouth at bedtime. 90 tablet 2  . metFORMIN (GLUCOPHAGE) 500 MG tablet Take 1 tablet (500 mg total) by mouth 2 (two) times daily with a meal. 180 tablet 3  . metoprolol tartrate (LOPRESSOR) 25 MG tablet Take 1 tablet in the AM and 2 tablets in the PM 270 tablet 2  . polyethylene glycol powder (GLYCOLAX/MIRALAX) 17 GM/SCOOP powder Take 17 g by mouth as needed. 850 g  2  . psyllium (METAMUCIL) 58.6 % powder Take 1 packet by mouth daily. 283 g 2  . rivaroxaban (XARELTO) 20 MG TABS tablet Take 1 tablet (20 mg total) by mouth daily with supper. 30 tablet 6  . acetaminophen (TYLENOL) 500 MG tablet Take 2 tablets (1,000 mg total) by mouth every 8 (eight) hours as needed. (Patient not taking: Reported on 03/10/2019) 100 tablet 2  . Cholecalciferol (VITAMIN D)  50 MCG (2000 UT) tablet Take 1 tablet (2,000 Units total) by mouth daily. (Patient not taking: Reported on 03/10/2019) 90 tablet 2  . vitamin B-12 (CYANOCOBALAMIN) 1000 MCG tablet Take 1 tablet (1,000 mcg total) by mouth daily. (Patient not taking: Reported on 03/10/2019) 90 tablet 2   No facility-administered medications prior to visit.     Allergies  Allergen Reactions  . Lisinopril Rash and Cough    ROS Review of Systems  Constitutional: Negative.   Respiratory: Negative.   Cardiovascular: Negative.   Musculoskeletal: Positive for arthralgias (constant pain to left 5th toe, lateral aspect of left foot, ankle) and joint swelling (left knee).  Skin: Negative.   Neurological: Negative.   Psychiatric/Behavioral: Negative.       Objective:    Physical Exam No vital sign or PE done There were no vitals taken for this visit. Wt Readings from Last 3 Encounters:  02/09/19 289 lb (131.1 kg)  12/28/18 289 lb (131.1 kg)  11/18/18 294 lb (133.4 kg)     Health Maintenance Due  Topic Date Due  . Hepatitis C Screening  1956/05/10  . FOOT EXAM  04/26/1964  . HIV Screening  04/26/1969  . TETANUS/TDAP  04/26/1973  . COLONOSCOPY  04/26/2004    There are no preventive care reminders to display for this patient.  Lab Results  Component Value Date   TSH 2.170 05/20/2018   Lab Results  Component Value Date   WBC 8.2 07/22/2018   HGB 13.2 07/22/2018   HCT 38.8 07/22/2018   MCV 90 07/22/2018   PLT 244 07/22/2018   Lab Results  Component Value Date   NA 144 11/11/2018   K 3.8 11/11/2018   CO2  29 11/11/2018   GLUCOSE 83 11/11/2018   BUN 24 11/11/2018   CREATININE 1.46 (H) 11/11/2018   BILITOT 0.5 11/11/2018   ALKPHOS 77 11/11/2018   AST 15 11/11/2018   ALT 18 11/11/2018   PROT 7.2 11/11/2018   ALBUMIN 4.4 11/11/2018   CALCIUM 9.9 11/11/2018   ANIONGAP 10 12/04/2017   Lab Results  Component Value Date   CHOL 156 11/11/2018   Lab Results  Component Value Date   HDL 28 (L) 11/11/2018   Lab Results  Component Value Date   LDLCALC 80 11/11/2018   Lab Results  Component Value Date   TRIG 238 (H) 11/11/2018   Lab Results  Component Value Date   CHOLHDL 5.6 (H) 11/11/2018   Lab Results  Component Value Date   HGBA1C 6.3 (H) 11/11/2018      Assessment & Plan:    1. Acute gout of left foot, unspecified cause - Possible worsening neuropathic pain or gout flare. He will continue on 200 mg gabapentin bid, 300 mg gabapentin at bedtime, 100 mg allopurinol daily and will start prednisone taper, was educated on medication side effects. Will obtain uric acid level and routine labs. He was advised to go to the ED for worsening symptoms. - predniSONE (DELTASONE) 5 MG tablet; Take 30 mg Day 1, 25 mg Day 2, 20 mg Day 3, 15 mg Day 4, 10 mg Day 5 and 5 mg Day 6  Dispense: 21 tablet; Refill: 0  2. Cyst of left knee joint - He was advised to take otc tylenol, alternate using ice pack and heat to left knee. Continue on prednisone. He was encouraged to go to the ED for worsening symptoms.    Follow-up: Return in about 1 week (around 03/17/2019), or if symptoms worsen or  fail to improve.    Pina Sirianni Jerold Coombe, NP

## 2019-03-10 NOTE — Patient Instructions (Signed)

## 2019-03-16 ENCOUNTER — Other Ambulatory Visit: Payer: Self-pay

## 2019-03-16 DIAGNOSIS — I4891 Unspecified atrial fibrillation: Secondary | ICD-10-CM

## 2019-03-16 DIAGNOSIS — E1121 Type 2 diabetes mellitus with diabetic nephropathy: Secondary | ICD-10-CM

## 2019-03-16 NOTE — Progress Notes (Unsigned)
uric

## 2019-03-17 ENCOUNTER — Ambulatory Visit: Payer: Self-pay | Admitting: Adult Health

## 2019-03-17 DIAGNOSIS — E785 Hyperlipidemia, unspecified: Secondary | ICD-10-CM

## 2019-03-17 DIAGNOSIS — E1159 Type 2 diabetes mellitus with other circulatory complications: Secondary | ICD-10-CM

## 2019-03-17 DIAGNOSIS — M25862 Other specified joint disorders, left knee: Secondary | ICD-10-CM

## 2019-03-17 DIAGNOSIS — E1140 Type 2 diabetes mellitus with diabetic neuropathy, unspecified: Secondary | ICD-10-CM

## 2019-03-17 DIAGNOSIS — E1121 Type 2 diabetes mellitus with diabetic nephropathy: Secondary | ICD-10-CM

## 2019-03-17 DIAGNOSIS — N183 Chronic kidney disease, stage 3 unspecified: Secondary | ICD-10-CM

## 2019-03-17 DIAGNOSIS — M109 Gout, unspecified: Secondary | ICD-10-CM

## 2019-03-17 DIAGNOSIS — IMO0002 Reserved for concepts with insufficient information to code with codable children: Secondary | ICD-10-CM

## 2019-03-17 DIAGNOSIS — I4891 Unspecified atrial fibrillation: Secondary | ICD-10-CM

## 2019-03-17 DIAGNOSIS — E1169 Type 2 diabetes mellitus with other specified complication: Secondary | ICD-10-CM

## 2019-03-17 MED ORDER — ALLOPURINOL 300 MG PO TABS: 150.0000 mg | ORAL_TABLET | Freq: Every day | ORAL | 2 refills | Status: AC

## 2019-03-17 NOTE — Progress Notes (Signed)
Patient: Steve Buck. Male    DOB: [birth year]   63 y.o.   MRN: [id number] Visit Date: 03/17/2019  Today's Provider: Deforest Hoyles, NP  Patient consents to Telephone/or Telehealth visit and two identifiers have been used to establish patient's identity prior to visit  Chief Complaint  Patient presents with  . Follow-up    review recent labs; Gout is improving, just in L big toe   Subjective:    HPI This is a 63 year old Caucasian male who presents for routine follow-up and for labs review.  He reports doing well overall.  He had gout flareup in his left great toe but that has improved.  He is some allopurinol 100 mg daily for gout prophylaxis.  His last uric acid was 8.8.  Blood glucose levels have been stable at home with readings all less than 150 mg/dL in the morning and less than 175 mg/dL postprandial.  He denies any hypoglycemic episodes.  His lipid panel however showed a triglyceride level of 276 mg/dL up from 238 previously.  His potassium was marginally low.  He denies any symptoms pertaining to low potassium levels.  He is complaining of small swelling behind his left kneecap which is not new but recently he has been experiencing some pain and increased swelling. He reports a h/o Osgood-Schlatter disease but I cannot find any information in his chart pertaining to this diagnosis.  Otherwise patient offers no other complaints.  He takes all his medications as prescribed.  He denies any medication side effects.  He is not due for any refills.   Allergies  Allergen Reactions  . Lisinopril Rash and Cough   Previous Medications   ACETAMINOPHEN (TYLENOL) 500 MG TABLET    Take 2 tablets (1,000 mg total) by mouth every 8 (eight) hours as needed.   ALLOPURINOL (ZYLOPRIM) 100 MG TABLET    Take 1 tablet (100 mg total) by mouth daily.   AMLODIPINE (NORVASC) 10 MG TABLET    Take 1 tablet (10 mg total) by mouth daily.   ATORVASTATIN (LIPITOR) 10 MG TABLET    Take 1 tablet (10 mg  total) by mouth daily.   CHOLECALCIFEROL (VITAMIN D) 50 MCG (2000 UT) TABLET    Take 1 tablet (2,000 Units total) by mouth daily.   CLONIDINE (CATAPRES) 0.3 MG TABLET    Take 1 tablet (0.3 mg total) by mouth 2 (two) times daily.   GABAPENTIN (NEURONTIN) 100 MG CAPSULE    Take 2 capsules (200 mg total) by mouth 2 (two) times daily.   GABAPENTIN (NEURONTIN) 300 MG CAPSULE    Take 1 capsule (300 mg total) by mouth at bedtime.   GLUCOSE BLOOD TEST STRIP    1 each by Other route 3 (three) times daily. Use as instructed   HYDROCHLOROTHIAZIDE (HYDRODIURIL) 25 MG TABLET    Take 1 tablet (25 mg total) by mouth daily.   LANCETS MISC    1 each by Does not apply route 3 (three) times daily.   LOSARTAN (COZAAR) 50 MG TABLET    Take 1 tablet (50 mg total) by mouth at bedtime.   METFORMIN (GLUCOPHAGE) 500 MG TABLET    Take 1 tablet (500 mg total) by mouth 2 (two) times daily with a meal.   METOPROLOL TARTRATE (LOPRESSOR) 25 MG TABLET    Take 1 tablet in the AM and 2 tablets in the PM   POLYETHYLENE GLYCOL POWDER (GLYCOLAX/MIRALAX) 17 GM/SCOOP POWDER    Take 17 g by mouth as needed.  PSYLLIUM (METAMUCIL) 58.6 % POWDER    Take 1 packet by mouth daily.   RIVAROXABAN (XARELTO) 20 MG TABS TABLET    Take 1 tablet (20 mg total) by mouth daily with supper.   VITAMIN B-12 (CYANOCOBALAMIN) 1000 MCG TABLET    Take 1 tablet (1,000 mcg total) by mouth daily.    Review of Systems  Constitutional: Negative.   Respiratory: Negative.   Cardiovascular: Negative.   Gastrointestinal: Negative.   Endocrine: Negative.   Musculoskeletal: Positive for arthralgias (Left knee pain).  Skin: Negative.   Neurological:       Bilateral lower extremity neuropathic pain    Social History   Tobacco Use  . Smoking status: Never Smoker  . Smokeless tobacco: Never Used  Substance Use Topics  . Alcohol use: Not Currently   Objective:   There were no vitals taken for this visit.  Physical Exam Unable to obtain as this is a  virtual visit     Assessment & Plan:  1. Acute gout of left foot, unspecified cause Improved. Will increase ALLOPURINOL to 150 mg daily - Uric acid IN 6 WEEKS  2. Cyst of left knee joint Voltaren gel to affected areas 4 time a day as needed. Will refer to ortho if symptoms get worse  3. Diabetic nephropathy associated with type 2 diabetes mellitus ([place]) Continue current medications and daily foot care as recommended  4. Atrial fibrillation, unspecified type ([place]) Continue current medications, f/u with cardiology and monitor HR  5. Type 2 diabetes mellitus, uncontrolled, with neuropathy (HCC) Well controlled. Continue current medications  6. Hypertension associated with diabetes ([place]) Well controlled. Continue current medications  7. CKD (chronic kidney disease) stage 3, GFR 30-59 ml/min (HCC) GFR/Creatinine stable. Continue ARB. Will continue to monitor  8. Morbid obesity ([place]) Lifestyle modification reinforced  9. Hyperlipidemia associated with type 2 diabetes mellitus ([place]) Continue current statin dose  Deforest Hoyles, NP   [facility]

## 2019-03-18 ENCOUNTER — Encounter: Payer: Self-pay | Admitting: Adult Health

## 2019-03-18 MED ORDER — DICLOFENAC SODIUM 1 % TD GEL: 2.0000 g | Freq: Four times a day (QID) | TRANSDERMAL | 2 refills | Status: DC

## 2019-03-21 LAB — CBC WITH DIFFERENTIAL/PLATELET
Basophils Absolute: 0.1 10*3/uL (ref 0.0–0.2)
Basos: 0 %
EOS (ABSOLUTE): 0.2 10*3/uL (ref 0.0–0.4)
Eos: 2 %
Hematocrit: 41.4 % (ref 37.5–51.0)
Hemoglobin: 14.4 g/dL (ref 13.0–17.7)
Immature Grans (Abs): 0.1 10*3/uL (ref 0.0–0.1)
Immature Granulocytes: 1 %
Lymphocytes Absolute: 3.3 10*3/uL — ABNORMAL HIGH (ref 0.7–3.1)
Lymphs: 29 %
MCH: 32.1 pg (ref 26.6–33.0)
MCHC: 34.8 g/dL (ref 31.5–35.7)
MCV: 92 fL (ref 79–97)
Monocytes Absolute: 0.7 10*3/uL (ref 0.1–0.9)
Monocytes: 6 %
Neutrophils Absolute: 7.2 10*3/uL — ABNORMAL HIGH (ref 1.4–7.0)
Neutrophils: 62 %
Platelets: 338 10*3/uL (ref 150–450)
RBC: 4.48 x10E6/uL (ref 4.14–5.80)
RDW: 13.9 % (ref 11.6–15.4)
WBC: 11.5 10*3/uL — ABNORMAL HIGH (ref 3.4–10.8)

## 2019-03-21 LAB — COMPREHENSIVE METABOLIC PANEL
ALT: 16 IU/L (ref 0–44)
AST: 13 IU/L (ref 0–40)
Albumin/Globulin Ratio: 1.3 (ref 1.2–2.2)
Albumin: 4.1 g/dL (ref 3.8–4.8)
Alkaline Phosphatase: 74 IU/L (ref 39–117)
BUN/Creatinine Ratio: 24 (ref 10–24)
BUN: 38 mg/dL — ABNORMAL HIGH (ref 8–27)
Bilirubin Total: 0.5 mg/dL (ref 0.0–1.2)
CO2: 27 mmol/L (ref 20–29)
Calcium: 9.6 mg/dL (ref 8.6–10.2)
Chloride: 94 mmol/L — ABNORMAL LOW (ref 96–106)
Creatinine, Ser: 1.56 mg/dL — ABNORMAL HIGH (ref 0.76–1.27)
GFR calc Af Amer: 53 mL/min/{1.73_m2} — ABNORMAL LOW (ref >59)
GFR calc non Af Amer: 46 mL/min/{1.73_m2} — ABNORMAL LOW (ref >59)
Globulin, Total: 3.1 g/dL (ref 1.5–4.5)
Glucose: 126 mg/dL — ABNORMAL HIGH (ref 65–99)
Potassium: 3.4 mmol/L — ABNORMAL LOW (ref 3.5–5.2)
Sodium: 139 mmol/L (ref 134–144)
Total Protein: 7.2 g/dL (ref 6.0–8.5)

## 2019-03-21 LAB — MAGNESIUM: Magnesium: 2.1 mg/dL (ref 1.6–2.3)

## 2019-03-21 LAB — URIC ACID: Uric Acid: 8.8 mg/dL — ABNORMAL HIGH (ref 3.7–8.6)

## 2019-03-21 LAB — HEMOGLOBIN A1C
Est. average glucose Bld gHb Est-mCnc: 140 mg/dL
Hgb A1c MFr Bld: 6.5 % — ABNORMAL HIGH (ref 4.8–5.6)

## 2019-03-21 LAB — VITAMIN D 1,25 DIHYDROXY
Vitamin D 1, 25 (OH)2 Total: 40 pg/mL
Vitamin D2 1, 25 (OH)2: <10 pg/mL
Vitamin D3 1, 25 (OH)2: 40 pg/mL

## 2019-03-21 LAB — LIPID PANEL
Chol/HDL Ratio: 5.6 ratio — ABNORMAL HIGH (ref 0.0–5.0)
Cholesterol, Total: 158 mg/dL (ref 100–199)
HDL: 28 mg/dL — ABNORMAL LOW (ref >39)
LDL Calculated: 75 mg/dL (ref 0–99)
Triglycerides: 276 mg/dL — ABNORMAL HIGH (ref 0–149)
VLDL Cholesterol Cal: 55 mg/dL — ABNORMAL HIGH (ref 5–40)

## 2019-03-21 LAB — PHOSPHORUS: Phosphorus: 3.4 mg/dL (ref 2.8–4.1)

## 2019-03-21 LAB — VITAMIN B12: Vitamin B-12: 462 pg/mL (ref 232–1245)

## 2019-03-24 ENCOUNTER — Ambulatory Visit: Payer: Self-pay | Admitting: Ophthalmology

## 2019-03-30 ENCOUNTER — Encounter: Payer: Self-pay | Admitting: *Deleted

## 2019-03-31 ENCOUNTER — Other Ambulatory Visit: Payer: Self-pay | Admitting: Adult Health

## 2019-03-31 MED ORDER — COLCHICINE 0.6 MG PO TABS: 1.2000 mg | ORAL_TABLET | Freq: Every day | ORAL | 0 refills | Status: DC

## 2019-03-31 MED ORDER — PREDNISONE 20 MG PO TABS: 60.0000 mg | ORAL_TABLET | Freq: Every day | ORAL | 0 refills | Status: AC

## 2019-03-31 NOTE — Progress Notes (Signed)
Ordered prednisone and colchicine for acute gout

## 2019-04-18 DIAGNOSIS — I5032 Chronic diastolic (congestive) heart failure: Secondary | ICD-10-CM | POA: Diagnosis not present

## 2019-04-18 DIAGNOSIS — Z23 Encounter for immunization: Secondary | ICD-10-CM | POA: Diagnosis not present

## 2019-04-18 DIAGNOSIS — E1142 Type 2 diabetes mellitus with diabetic polyneuropathy: Secondary | ICD-10-CM | POA: Diagnosis not present

## 2019-04-18 DIAGNOSIS — Z0000 Encounter for general adult medical examination without abnormal findings: Secondary | ICD-10-CM | POA: Diagnosis not present

## 2019-04-18 DIAGNOSIS — N183 Chronic kidney disease, stage 3 (moderate): Secondary | ICD-10-CM | POA: Diagnosis not present

## 2019-04-18 DIAGNOSIS — I480 Paroxysmal atrial fibrillation: Secondary | ICD-10-CM | POA: Diagnosis not present

## 2019-04-18 DIAGNOSIS — E1122 Type 2 diabetes mellitus with diabetic chronic kidney disease: Secondary | ICD-10-CM | POA: Diagnosis not present

## 2019-04-28 ENCOUNTER — Other Ambulatory Visit: Payer: Self-pay

## 2019-05-05 ENCOUNTER — Ambulatory Visit: Payer: Self-pay | Admitting: Adult Health

## 2019-06-03 ENCOUNTER — Ambulatory Visit (INDEPENDENT_AMBULATORY_CARE_PROVIDER_SITE_OTHER): Payer: Self-pay | Admitting: *Deleted

## 2019-06-03 DIAGNOSIS — R002 Palpitations: Secondary | ICD-10-CM

## 2019-06-04 LAB — CUP PACEART REMOTE DEVICE CHECK
Date Time Interrogation Session: 20200918180832
Implantable Pulse Generator Implant Date: 20191024

## 2019-06-06 NOTE — Progress Notes (Signed)
Carelink Summary Report / Loop Recorder 

## 2019-06-09 ENCOUNTER — Other Ambulatory Visit: Payer: Self-pay

## 2019-06-09 ENCOUNTER — Telehealth (INDEPENDENT_AMBULATORY_CARE_PROVIDER_SITE_OTHER): Payer: PPO | Admitting: Internal Medicine

## 2019-06-09 VITALS — BP 131/68 | HR 62 | Ht 72.0 in | Wt 287.0 lb

## 2019-06-09 DIAGNOSIS — I4891 Unspecified atrial fibrillation: Secondary | ICD-10-CM | POA: Diagnosis not present

## 2019-06-09 DIAGNOSIS — R001 Bradycardia, unspecified: Secondary | ICD-10-CM | POA: Diagnosis not present

## 2019-06-09 NOTE — Progress Notes (Signed)
Electrophysiology TeleHealth Note   Due to national recommendations of social distancing due to COVID 19, an audio/video telehealth visit is felt to be most appropriate for this patient at this time. The patient did not have access to video technology/had technical difficulties with video requiring transitioning to audio format only (telephone).  All issues noted in this document were discussed and addressed.  No physical exam could be performed with this format.     See MyChart message from today for the patient's consent to telehealth for [facility].   Date:  06/09/2019   ID:  Steve Marvel., DOB 1955/09/27, MRN [id number]  Location: patient's home  Provider location: [address] [place]  Evaluation Performed: Follow-up visit  PCP:  Default, Provider, MD  Cardiologist:     Electrophysiologist:  SK   Chief Complaint:  Dyspnea and palpitations  History of Present Illness:    Steve Morning. is a 63 y.o. male who presents via audio/video conferencing for a telehealth visit today.  Since last being seen in our clinic    the patient reports less problems with his PVCs.  Intermittent atrial fibrillation.  Chronic dyspnea.  He had put on 50 pounds in the last year.  No edema.   Pain in knees and feet.  He has been able to lose 20+ lbs in the last few weeks with diet control  Dyspnea with exertion is somewhat less  Sleep disordered breathing and daytime somnolence.  DATE TEST EF   4/19 MYOVIEW   37 % No ischemia  4/19 Echo   60-65 %   12/19 CTA  CaScore 540 -- FFR neg Ao Root 43   Date Cr K Hgb  7/20 1.56 3.4 14.4           The patient denies symptoms of fevers, chills, cough, or new SOB worrisome for COVID 19   Past Medical History:  Diagnosis Date  . (HFpEF) heart failure with preserved ejection fraction ([place])    a. 12/2017 Echo: EF 60-65%, no rwma, Gr1DD, mild MR, mildly dil LA, nl RV fxn.  . Allergy    Seasonal  . Chest pain     a. 12/2017 Lexiscan MV: EF 37% (nl by echo). Small mild, fixed region of apical thinning, likely attenuation artifact. No ischemia. Low risk.  . CKD (chronic kidney disease) stage 3, GFR 30-59 ml/min (HCC) 03/30/2018  . Diabetic nephropathy associated with type 2 diabetes mellitus ([place]) 03/25/2018  . DKA (diabetic ketoacidoses) ([place]) 12/01/2017  . Heart murmur    a. 12/2017 Echo: Mild MR.  Marland Kitchen History of Bell's palsy 03/30/2018  . Hypertension   . Morbid obesity ([place])   . Palpitations   . PVC's (premature ventricular contractions)    a. 05/2018 Holter: RSR, avg rate of 60. Freq PVC's (6.4% burden). Some ventricular bigeminy/trigeminy.  . Type 2 diabetes mellitus, uncontrolled, with neuropathy ([place])     Past Surgical History:  Procedure Laterality Date  . colonoscvopy  2015  . FOOT SURGERY  around 1994  . LOOP RECORDER INSERTION N/A 07/08/2018   Procedure: LOOP RECORDER INSERTION;  Surgeon: Deboraha Sprang, MD;  Location: [facility];  Service: Cardiovascular;  Laterality: N/A;    Current Outpatient Medications  Medication Sig Dispense Refill  . acetaminophen (TYLENOL) 500 MG tablet Take 2 tablets (1,000 mg total) by mouth every 8 (eight) hours as needed. 100 tablet 2  . allopurinol (ZYLOPRIM) 300 MG tablet Take 0.5 tablets (  150 mg total) by mouth daily. 90 tablet 2  . amLODipine (NORVASC) 10 MG tablet Take 1 tablet (10 mg total) by mouth daily. 90 tablet 3  . atorvastatin (LIPITOR) 10 MG tablet Take 1 tablet (10 mg total) by mouth daily. 90 tablet 3  . cloNIDine (CATAPRES) 0.3 MG tablet Take 1 tablet (0.3 mg total) by mouth 2 (two) times daily. 180 tablet 3  . diclofenac sodium (VOLTAREN) 1 % GEL Apply 2 g topically 4 (four) times daily. 50 g 2  . gabapentin (NEURONTIN) 100 MG capsule Take 2 capsules (200 mg total) by mouth 2 (two) times daily. 180 capsule 2  . gabapentin (NEURONTIN) 300 MG capsule Take 1 capsule (300 mg total) by mouth at bedtime. 90 capsule 3  . glucose blood  test strip 1 each by Other route 3 (three) times daily. Use as instructed 300 each 4  . hydrochlorothiazide (HYDRODIURIL) 25 MG tablet Take 1 tablet (25 mg total) by mouth daily. 90 tablet 3  . Lancets MISC 1 each by Does not apply route 3 (three) times daily. 300 each 4  . losartan (COZAAR) 50 MG tablet Take 1 tablet (50 mg total) by mouth at bedtime. 90 tablet 2  . metFORMIN (GLUCOPHAGE) 500 MG tablet Take 1 tablet (500 mg total) by mouth 2 (two) times daily with a meal. 180 tablet 3  . metoprolol tartrate (LOPRESSOR) 25 MG tablet Take 1 tablet in the AM and 2 tablets in the PM 270 tablet 2  . polyethylene glycol powder (GLYCOLAX/MIRALAX) 17 GM/SCOOP powder Take 17 g by mouth as needed. 850 g 2  . psyllium (METAMUCIL) 58.6 % powder Take 1 packet by mouth daily. 283 g 2  . rivaroxaban (XARELTO) 20 MG TABS tablet Take 1 tablet (20 mg total) by mouth daily with supper. 30 tablet 6  . Cholecalciferol (VITAMIN D) 50 MCG (2000 UT) tablet Take 1 tablet (2,000 Units total) by mouth daily. (Patient not taking: Reported on 06/09/2019) 90 tablet 2  . colchicine 0.6 MG tablet Take 2 tablets (1.2 mg total) by mouth daily. Take 1.2mg (2 tablets) po x1 and then 0.6 mg 1 hr later x1 (Patient not taking: Reported on 06/09/2019) 3 tablet 0  . vitamin B-12 (CYANOCOBALAMIN) 1000 MCG tablet Take 1 tablet (1,000 mcg total) by mouth daily. (Patient not taking: Reported on 06/09/2019) 90 tablet 2   No current facility-administered medications for this visit.     Allergies:   Lisinopril   Social History:  The patient  reports that he has never smoked. He has never used smokeless tobacco. He reports previous alcohol use. He reports current drug use. Drug: Marijuana.   Family History:  The patient's   family history includes Arthritis in his father; CAD in his father; Cancer in his mother.   ROS:  Please see the history of present illness.   All other systems are personally reviewed and negative.    Exam:    Vital  Signs:  BP 131/68   Pulse 62   Ht 6' (1.829 m)   Wt 287 lb (130.2 kg)   BMI 38.92 kg/m     Well appearing, alert and conversant, regular work of breathing,  good skin color Eyes- anicteric, neuro- grossly intact, skin- no apparent rash or lesions or cyanosis, mouth- oral mucosa is pink   Labs/Other Tests and Data Reviewed:    Recent Labs: 03/16/2019: ALT 16; BUN 38; Creatinine, Ser 1.56; Hemoglobin 14.4; Magnesium 2.1; Platelets 338; Potassium 3.4; Sodium 139  Wt Readings from Last 3 Encounters:  06/09/19 287 lb (130.2 kg)  02/09/19 289 lb (131.1 kg)  12/28/18 289 lb (131.1 kg)     Other studies personally reviewed: Additional studies/ records that were reviewed today include     ASSESSMENT & PLAN:    PVCs left bundle inferior axis with a QS in lead aVR and R wave in lead aVL-delayed intrinsicoid deflection  Hypertension  Atrial fibrillation-secondary recurrent   Obesity  Anxiety/depression  Aortic enlargement  Gynecomastia Left    He has lost 20 + lbs  We discussed rather than proceeding with sleep study now as he has a weight loss goal of another 50 + lbs, we should reassess in a few months and see if he continues to make progress   On Anticoagulation;  No bleeding issues   His K was borderline low at it needs to be repeated Will arrange    COVID 19 screen The patient denies symptoms of COVID 19 at this time.  The importance of social distancing was discussed today.  Follow-up:  Will need sleep study Current medicines are reviewed at length with the patient today.   The patient does   not have  concerns regarding his medicines.  The following changes were made today:  none    Labs/ tests ordered today include:  No orders of the defined types were placed in this encounter.   Future tests ( post COVID )     Patient Risk:  after full review of this patients clinical status, I feel that they are at moderate  risk at this time.  Today, I have  spent 11 minutes with the patient with telehealth technology discussing the above.  Signed, Virl Axe, MD  06/09/2019 5:30 PM     [address] [place] [address] [phone] (office) [phone] (fax)

## 2019-06-13 ENCOUNTER — Telehealth: Payer: Self-pay

## 2019-06-13 ENCOUNTER — Other Ambulatory Visit
Admission: RE | Admit: 2019-06-13 | Discharge: 2019-06-13 | Disposition: A | Discharge status: Home / Self Care | Payer: PPO | Source: Ambulatory Visit | Attending: Internal Medicine | Admitting: Internal Medicine

## 2019-06-13 DIAGNOSIS — I4891 Unspecified atrial fibrillation: Secondary | ICD-10-CM

## 2019-06-13 LAB — BASIC METABOLIC PANEL
Anion gap: 10 (ref 5–15)
BUN: 25 mg/dL — ABNORMAL HIGH (ref 8–23)
CO2: 32 mmol/L (ref 22–32)
Calcium: 9.7 mg/dL (ref 8.9–10.3)
Chloride: 97 mmol/L — ABNORMAL LOW (ref 98–111)
Creatinine, Ser: 1.55 mg/dL — ABNORMAL HIGH (ref 0.61–1.24)
GFR calc Af Amer: 54 mL/min — ABNORMAL LOW (ref >60)
GFR calc non Af Amer: 46 mL/min — ABNORMAL LOW (ref >60)
Glucose, Bld: 111 mg/dL — ABNORMAL HIGH (ref 70–99)
Potassium: 3.8 mmol/L (ref 3.5–5.1)
Sodium: 139 mmol/L (ref 135–145)

## 2019-06-13 NOTE — Telephone Encounter (Signed)
Incoming message received from Dr. Caryl Comes. Pt needing lab redraw for K of 3.4.   Call to patient to make him aware of recommendations from Dr. Caryl Comes. He verbalized understanding and will report to the medical mall today.   Advised pt to call for any further questions or concerns.

## 2019-06-21 ENCOUNTER — Telehealth: Payer: Self-pay | Admitting: Internal Medicine

## 2019-06-21 NOTE — Telephone Encounter (Signed)
Attempted to call the patient with results.  No answer- I left a message to please call back.  Phone note started.

## 2019-06-21 NOTE — Telephone Encounter (Signed)
Notes recorded by Deboraha Sprang, MD on 06/20/2019 at 8:45 AM EDT  Please Inform Patient that labs are stable witb modest renal insufficiency   Thanks

## 2019-06-23 NOTE — Telephone Encounter (Signed)
I spoke with the patient regarding his lab results. He voiced understanding.

## 2019-06-30 ENCOUNTER — Telehealth: Payer: Self-pay | Admitting: Internal Medicine

## 2019-06-30 NOTE — Telephone Encounter (Signed)
Attempted to call the patient. No answer- I left a message to call back.  Can offer Xarelto samples- we currently have 10 mg tabs in the office (he would need to take 2 tablets daily) and try to apply for patient assistance.

## 2019-06-30 NOTE — Telephone Encounter (Signed)
New Message  Pt c/o medication issue:  1. Name of Medication: rivaroxaban (XARELTO) 20 MG TABS tablet  2. How are you currently taking this medication (dosage and times per day)? As directed  3. Are you having a reaction (difficulty breathing--STAT)? No  4. What is your medication issue? Patient states that he can not afford this medication and he needs another medication. States that he will just start back taking aspirin if there is no other medication that he can take. Patient states that he has 4 pills left and he will not be going to refill due to him not being able to afford the medication.

## 2019-06-30 NOTE — Telephone Encounter (Signed)
[place] pt.  Will route to their triage pool to see if they are able to assist pt with this issue.

## 2019-06-30 NOTE — Telephone Encounter (Signed)
Returned call to patient to talk about different options for medication and insurance process. Pt agrees to call insurance provider to see what best option will be and give [place] a call back to see if he would like to proceed with patient assistance.   Pt will return call to make [place] aware of preference.

## 2019-06-30 NOTE — Telephone Encounter (Signed)
Patient doesn't think he will qualify for assistance based on spouse income.  Patient wants to ask klein about a different med.

## 2019-07-01 NOTE — Telephone Encounter (Signed)
Pt returned call, he reports that insurance suggested j[place]oven for insurance to fully cover.   At the time he does not want to proceed with pt assistance form as he has applied and did not qualify d/t spouse income. He only has $100/m left over after bills and cannot afford Xarelto OOP.   Pt agreeable to come into clinic if necessary for blood checks.   Routing to Dr. Caryl Comes to advise.

## 2019-07-04 NOTE — Telephone Encounter (Signed)
I   THx  Difficult situation  .chadsvas score is 3 so annual risk of stroke is about 3+% / yr and this can be reduced by about 60-70% by ,anticoagulation Warfarin is not as good safe or convenient as Rivaroxaban but it is much better than ASA or nothing  Let me know if he needs more information to help with his decision Thanks SK

## 2019-07-05 NOTE — Telephone Encounter (Signed)
Attempted to call patient. LMTCB 07/05/2019

## 2019-07-05 NOTE — Telephone Encounter (Signed)
Spoke to patient about response from Dr. Caryl Comes. He said he would be fine with attempting Xarelto pt assistance application.   He will come in tomorrow to pick up samples and fill out patient portion of application and will provide all necessary paperwork.

## 2019-07-05 NOTE — Telephone Encounter (Signed)
Patient is returning your call.  

## 2019-07-06 ENCOUNTER — Ambulatory Visit (INDEPENDENT_AMBULATORY_CARE_PROVIDER_SITE_OTHER): Payer: PPO | Admitting: *Deleted

## 2019-07-06 DIAGNOSIS — I480 Paroxysmal atrial fibrillation: Secondary | ICD-10-CM | POA: Diagnosis not present

## 2019-07-07 LAB — CUP PACEART REMOTE DEVICE CHECK
Date Time Interrogation Session: 20201021181230
Implantable Pulse Generator Implant Date: 20191024

## 2019-07-07 NOTE — Telephone Encounter (Signed)
Pt came into office 10/21. Received samples for 2 weeks and took patient portion of application home to fill out and return at earliest convenience.   He gave [place] copies of tax return and insurance cards.

## 2019-07-19 DIAGNOSIS — E1142 Type 2 diabetes mellitus with diabetic polyneuropathy: Secondary | ICD-10-CM | POA: Diagnosis not present

## 2019-07-19 DIAGNOSIS — I480 Paroxysmal atrial fibrillation: Secondary | ICD-10-CM | POA: Diagnosis not present

## 2019-07-19 DIAGNOSIS — I10 Essential (primary) hypertension: Secondary | ICD-10-CM | POA: Diagnosis not present

## 2019-07-19 DIAGNOSIS — I5032 Chronic diastolic (congestive) heart failure: Secondary | ICD-10-CM | POA: Diagnosis not present

## 2019-07-19 DIAGNOSIS — E1121 Type 2 diabetes mellitus with diabetic nephropathy: Secondary | ICD-10-CM | POA: Diagnosis not present

## 2019-07-19 DIAGNOSIS — N1832 Chronic kidney disease, stage 3b: Secondary | ICD-10-CM | POA: Diagnosis not present

## 2019-07-19 NOTE — Progress Notes (Signed)
Carelink Summary Report / Loop Recorder 

## 2019-07-22 ENCOUNTER — Telehealth: Payer: Self-pay

## 2019-07-22 NOTE — Telephone Encounter (Signed)
Pt states he is not at home but will send a manual transmission when he get home. I told him the nurse will review it and give him a call back on Monday. The pt verbalized understanding.

## 2019-07-22 NOTE — Telephone Encounter (Signed)
NOTES ON [place] [phone], SENT REFERRAL TO SCHEDULING

## 2019-07-22 NOTE — Telephone Encounter (Addendum)
Call to patient to make him aware that patient assistance form has been signed and submitted by fax.   Made call to [facility] to give them 30 day free code that will be used for next refill.   Pt also reported he was seen by PCP at [facility] yesterday and had low HR in 40s. Pt reports episodes of low HR over the past month w assoc sx of dizziness, SOB and intermittent chest discomfort. Highest pain level 3/10. Denies sx at this time, pain score 0/10.  Pt has loop recorder and last transmission 10/21, no a fib present at that time.   I will reach out to device clinic to see about helping patient send remote transmission for review.   Pt having telephone issues at this time. Best number to reach is house number [phone].

## 2019-07-22 NOTE — Telephone Encounter (Signed)
LMOVM for pt to call me on my direct office number to receive assistance in sending a manual transmission with his home remote monitor.

## 2019-07-22 NOTE — Addendum Note (Signed)
Addended by: Lucienne Minks F on: 07/22/2019 01:50 PM   Modules accepted: Level of Service, SmartSet

## 2019-07-22 NOTE — Telephone Encounter (Signed)
This encounter was created in error - please disregard.

## 2019-07-22 NOTE — Telephone Encounter (Signed)
Pt assistance forms faxed to [employer].   Called pt to make him aware and offer 30 day free card while we are waiting to hear back. Line busy. Will try back.

## 2019-07-25 ENCOUNTER — Telehealth: Payer: Self-pay | Admitting: Emergency Medicine

## 2019-07-25 NOTE — Telephone Encounter (Signed)
LMOM .  Returning call reference his transmission received today. No episodes listed since 06/22/2019  Which was a false pause related to undersensing. Today's presenting appears to show bigeminal PVC's. Transmission from 07/22/19 did not show PVC's in presenting.

## 2019-07-25 NOTE — Telephone Encounter (Signed)
Transmission received.

## 2019-07-26 NOTE — Telephone Encounter (Signed)
Dr Caryl Comes updated on patient's symptoms and aware of ILR transmissions. Patient to continue medications as ordered. Have patient take walk using symptom activator to note episodes on transmission. Will follow-up 08/01/19 to assess patient and data from linq.   Patient educated on use of symptom activator and given instructions to use it when he feel symptoms. Will continue meds as ordered an ED precautions given.

## 2019-07-26 NOTE — Telephone Encounter (Signed)
Patient reports he has been experiencing episodes of dizziness and has a pulse of 38-55 according to his home BP monitor. He reports intermittent dull non-radiating left sided CP that he rates 3/10, no nausea, and no diaphoresis with the symptoms. Will send remote transmission today. ED precautions given.

## 2019-08-08 ENCOUNTER — Ambulatory Visit (INDEPENDENT_AMBULATORY_CARE_PROVIDER_SITE_OTHER): Payer: PPO | Admitting: *Deleted

## 2019-08-08 DIAGNOSIS — R001 Bradycardia, unspecified: Secondary | ICD-10-CM | POA: Diagnosis not present

## 2019-08-09 LAB — CUP PACEART REMOTE DEVICE CHECK
Date Time Interrogation Session: 20201124071102
Implantable Pulse Generator Implant Date: 20191024

## 2019-08-17 NOTE — Progress Notes (Signed)
Cardiology Office Note Date:  08/18/2019  Patient ID:  Steve Buck., DOB 1955/10/13, MRN [id number] PCP:  Kirk Ruths, MD  Cardiologist:  Dr. Caryl Comes     Chief Complaint: referred back by his PMD for bradycardia  History of Present Illness: Steve Buck. is a 63 y.o. male with history of HTN, DM, CKD (III), bells palsy, HFpEF, PVCs,  Afib, Obesity.  He comes in today to be seen for Dr. Caryl Comes.  Last seen by him Sept 2020 via tele-health.  Noted he had significant weight gain over the last year (50lbs), though with dietary restrictions had lost 20 with some improvement in his SOB.  Noted PVCs.  Planned to replace K+ that was borderline low.  Recent phone notes discuss pt inquiry about changing to warfarin, Dr. Caryl Comes recommended staying on [facility].  The patient is accompanied by his wife today.  He is very upset with his care from our practice, mentions being placed on a drug he can not afford (Xarelto) and no one here being willing to consider an alternative.  Having had a heart monitor implant that costs thousands though being told it was not necessary to monitor monthly (this seems to have been out of a cost concerns with monthly monitoring during a time when he was without insurance and offered to hold off monthly transmission to do Q 60mo in-office checks), and wants to know why would we implant something so expensive if it or he did not need monitoring.  He has become overwhelming frustrated, taking so many medicines, having a fixed income and his copay for the xarelto being just too much.  In regards to symptoms 1. Palpitations.       These can be on/off for hours, though not every day all day, and after much discussion is a missed/skipped or hard heart beat  2. He mentions his pulse can be in the 30's at times, taken manually by his wife     His PMD note reports reducing his metoprolol in 1/2 but the pt denies this, says he only added potassium, and is still taking  his same 25AM, 50PM metoprolol   3. CP     This is lower L chest, fleeting, sharp, lasts a moment/second, random, non-radiating, not positional or exertional  4. Fatigue     Generally tired all of the time, sometimes lightheaded, not positional, but a general sense of being lightheaded     No near syncope or syncope  5. DOE     Not new, but worse in the last 6 months or so, and more so of late     No rest SOB, no symptoms of PND or orthopnea    AFib Hx Initially associated with diabetic ketoacidosis/coma felt to be secondary Loop implanted April 2020 noted to have AFib > started on Xarelto  Device information MDT ILR, 06/18/2018, AFib survelleince   Past Medical History:  Diagnosis Date  . (HFpEF) heart failure with preserved ejection fraction ([place])    a. 12/2017 Echo: EF 60-65%, no rwma, Gr1DD, mild MR, mildly dil LA, nl RV fxn.  . Allergy    Seasonal  . Chest pain    a. 12/2017 Lexiscan MV: EF 37% (nl by echo). Small mild, fixed region of apical thinning, likely attenuation artifact. No ischemia. Low risk.  . CKD (chronic kidney disease) stage 3, GFR 30-59 ml/min (HCC) 03/30/2018  . Diabetic nephropathy associated with type 2 diabetes mellitus ([place]) 03/25/2018  . DKA (diabetic  ketoacidoses) ([place]) 12/01/2017  . Heart murmur    a. 12/2017 Echo: Mild MR.  Marland Kitchen History of Bell's palsy 03/30/2018  . Hypertension   . Morbid obesity ([place])   . Palpitations   . PVC's (premature ventricular contractions)    a. 05/2018 Holter: RSR, avg rate of 60. Freq PVC's (6.4% burden). Some ventricular bigeminy/trigeminy.  . Type 2 diabetes mellitus, uncontrolled, with neuropathy ([place])     Past Surgical History:  Procedure Laterality Date  . colonoscvopy  2015  . FOOT SURGERY  around 1994  . LOOP RECORDER INSERTION N/A 07/08/2018   Procedure: LOOP RECORDER INSERTION;  Surgeon: Deboraha Sprang, MD;  Location: [facility];  Service: Cardiovascular;  Laterality: N/A;    Current Outpatient  Medications  Medication Sig Dispense Refill  . acetaminophen (TYLENOL) 500 MG tablet Take 2 tablets (1,000 mg total) by mouth every 8 (eight) hours as needed. 100 tablet 2  . allopurinol (ZYLOPRIM) 300 MG tablet Take 0.5 tablets (150 mg total) by mouth daily. 90 tablet 2  . atorvastatin (LIPITOR) 10 MG tablet Take 1 tablet (10 mg total) by mouth daily. 90 tablet 3  . Cholecalciferol (VITAMIN D) 50 MCG (2000 UT) tablet Take 1 tablet (2,000 Units total) by mouth daily. 90 tablet 2  . cloNIDine (CATAPRES) 0.3 MG tablet Take 1 tablet (0.3 mg total) by mouth 2 (two) times daily. 180 tablet 3  . colchicine 0.6 MG tablet Take 2 tablets (1.2 mg total) by mouth daily. Take 1.2mg (2 tablets) po x1 and then 0.6 mg 1 hr later x1 3 tablet 0  . diclofenac sodium (VOLTAREN) 1 % GEL Apply 2 g topically 4 (four) times daily. 50 g 2  . escitalopram (LEXAPRO) 10 MG tablet Take 10 mg by mouth daily.    Marland Kitchen gabapentin (NEURONTIN) 100 MG capsule Take 2 capsules (200 mg total) by mouth 2 (two) times daily. 180 capsule 2  . gabapentin (NEURONTIN) 300 MG capsule Take 1 capsule (300 mg total) by mouth at bedtime. 90 capsule 3  . glucose blood test strip 1 each by Other route 3 (three) times daily. Use as instructed 300 each 4  . hydrochlorothiazide (HYDRODIURIL) 25 MG tablet Take 1 tablet (25 mg total) by mouth daily. 90 tablet 3  . Lancets MISC 1 each by Does not apply route 3 (three) times daily. 300 each 4  . losartan (COZAAR) 50 MG tablet Take 1 tablet (50 mg total) by mouth at bedtime. 90 tablet 2  . metFORMIN (GLUCOPHAGE) 500 MG tablet Take 1 tablet (500 mg total) by mouth 2 (two) times daily with a meal. 180 tablet 3  . metoprolol tartrate (LOPRESSOR) 25 MG tablet Take 1 tablet (25 mg total) by mouth 2 (two) times daily. Take 1 tablet in the AM and 2 tablets in the PM 180 tablet 2  . polyethylene glycol powder (GLYCOLAX/MIRALAX) 17 GM/SCOOP powder Take 17 g by mouth as needed. 850 g 2  . potassium chloride (KLOR-CON)  10 MEQ tablet Take 10 mEq by mouth daily.    . psyllium (METAMUCIL) 58.6 % powder Take 1 packet by mouth daily. 283 g 2  . rivaroxaban (XARELTO) 20 MG TABS tablet Take 1 tablet (20 mg total) by mouth daily with supper. 30 tablet 6  . vitamin B-12 (CYANOCOBALAMIN) 1000 MCG tablet Take 1 tablet (1,000 mcg total) by mouth daily. 90 tablet 2  . amLODipine (NORVASC) 10 MG tablet Take 1 tablet (10 mg total) by mouth daily. 90 tablet 3  No current facility-administered medications for this visit.     Allergies:   Lisinopril   Social History:  The patient  reports that he has never smoked. He has never used smokeless tobacco. He reports previous alcohol use. He reports current drug use. Drug: Marijuana.   Family History:  The patient's family history includes Arthritis in his father; CAD in his father; Cancer in his mother.  ROS:  Please see the history of present illness.  All other systems are reviewed and otherwise negative.   PHYSICAL EXAM:  VS:  BP (!) 144/80   Pulse (!) 50   Ht 6' (1.829 m)   Wt 284 lb (128.8 kg)   BMI 38.52 kg/m  BMI: Body mass index is 38.52 kg/m. Well nourished, well developed, in no acute distress  HEENT: normocephalic, atraumatic  Neck: no JVD, carotid bruits or masses Cardiac:  RRR; no significant murmurs, no rubs, or gallops Lungs:  CTA b/l, no wheezing, rhonchi or rales  Abd: soft, nontender MS: no deformity oratrophy Ext: no edema  Skin: warm and dry, no rash Neuro:  No gross deficits appreciated Psych: euthymic mood, full affect    EKG:  Done today and reviewed by myself shows SB, 1st degree AVblock, PR 215ms, some baseline movement, nonspecific ST/T changes  ILR interrogation done today and reviewed by myself Battery is good, R waves 0.06 5 symptoms episodes are all PVCs.  Single PVCs,  bigeminy or less frequent One AFib back in March (known, this is what prompted start of his a/c), none since One pause episode was false, undersensing No  tachy, no brady   08/2018 CTA IMPRESSION: 1. Ectatic ascending thoracic aorta measures approximately 4.2 cm diameter. Recommend annual imaging followup by CTA or MRA. This recommendation follows 2010 ACCF/AHA/AATS/ACR/ASA/SCA/SCAI/SIR/STS/SVM Guidelines for the Diagnosis and Management of Patients with Thoracic Aortic Disease. Circulation. 2010; 121: [id number]. 2. No significant extracardiac findings otherwise.  IMPRESSION: 1. Coronary calcium score of 542. This was 62 percentile for age and sex matched control.  2. Normal coronary origin with right and left co-dominance.  3. Study quality limited by patients size. There is diffuse CAD with moderate stenosis in the mid RCA, mid LAD, proximal D1, proximal LCX and mid OM1 artery. Additional analysis with CT FFR will be submitted.  4. There is ascending aortic aneurysm with maximum diameter 43 mm. Annual follow up with chest CTA or MRA is recommended.  5. Mildly dilated pulmonary artery measuring 32 mm suggestive of pulmonary hypertension.  IMPRESSION: 1.  CT FFR analysis didn't show any significant stenosis.   12/31/2017: TTE Study Conclusions - Left ventricle: The cavity size was normal. There was moderate   concentric hypertrophy. Systolic function was normal. The   estimated ejection fraction was in the range of 60% to 65%. Wall   motion was normal; there were no regional wall motion   abnormalities. Doppler parameters are consistent with abnormal   left ventricular relaxation (grade 1 diastolic dysfunction). - Mitral valve: There was mild regurgitation. - Left atrium: The atrium was mildly dilated. - Right ventricle: Systolic function was normal. - Pulmonary arteries: Systolic pressure was within the normal   range.  Recent Labs: 03/16/2019: ALT 16; Hemoglobin 14.4; Magnesium 2.1; Platelets 338 06/13/2019: BUN 25; Creatinine, Ser 1.55; Potassium 3.8; Sodium 139  03/16/2019: Chol/HDL Ratio 5.6; Cholesterol, Total  158; HDL 28; LDL Calculated 75; Triglycerides 276   CrCl cannot be calculated (Patient's most recent lab result is older than the maximum 21 days allowed.).  Wt Readings from Last 3 Encounters:  08/18/19 284 lb (128.8 kg)  06/09/19 287 lb (130.2 kg)  02/09/19 289 lb (131.1 kg)     Other studies reviewed: Additional studies/records reviewed today include: summarized above  ASSESSMENT AND PLAN:  1. Paroxysmal AFib     CHCA2DS2Vasc is 3, on Xarelto, appropriately dosed  Low burden We discussed alternatives to xarelto, (other OACs and warfarin)  His wife seems to have some experience with residents at the home she works at and is less likey to consider this, the pt reports the other medicines will be just as expensive.  He asks if the amount of AFib he has had warrants a/c.  I discussed with him embolic risk and rational for anticoagulation with Afib.  Discussed unknown how much AFib is "enough" to cause a stroke  He has decided to stay with Xarelto  They have filled out assistance form and returned it to the [place] office, they are given number to call to check on the status of his paperwork, and encouraged to call the office back if no progress. He is given 2 weeks of samples of xarelto and told to check in with [place], and will try to help with samples in the meantime when we can.   2. HTN     Looks OK today  3. PVCs     These are his palpitations/symptom events      He is quite anxious about these  Unclear what his burden is, unfortunately loop can not help with this. He does not feel them every day, but the days he has them can feel them intermittently for a couple hours   Discussed his loop monitor was not implanted for PVCs, and doesnot have an algorithm/ability to help monitor the burden of these, though his symptoms with them do and for now, will monitor PVC burden via palpitations/stymptoms  We discussed monitoring strategies with his loop for AFib surveillance, and  what he feels most comfortable with clinically and financially For now, he would like to continue monthly and see if with his current insurance what his copay is  PMD did labs, pt reports was started on potassium, will defer ongoing management to his primary.   4. Bradycardia     He is fatigued, tired, has had some vague sense of lightheadedness, perhaps dizzy     ? If his bradycardia is a component of his DOE     Pulses noted by his wife in the 30's I suspect are 2/2 to his PVCs given they check it when he is having palpitations  Discussed striking a balance between minimizing his PVCs without worsening bradycardia I have changed his brady detection on his loop to 40bpm  When I am checking his loop as he is talking his HR increases to 60-65 He mentions the PM dose was initially increased for fast HR at night when he would get up to use the bathroom, should these start again he will use the symptom activator so we can see what it is. Reduced his metoprolol to 25mg  BID  PMD did TSH, unknown results, though will defer to primary  5. HFpEF     He does not appear volume OL today, lungs are very clear, no edema     Weight is down from earlier this year     He does though had some progressing DOE     ? Bradycardia, ? Change in EF       Will repeat his echo  6. CP     Very atypical sounding     No obstructive CAD by CTA/FFR last year     No further coronary w/u at this time     In review of his PMD notes, they did labs including lipids, will defer to PMD management of his cholesterol     Notes also discuss they went over healthy eating, DM control and management as well.   7. CT last year noted Ectatic ascending thoracic aorta measures approximately 4.2 cm     Recommended annual surveillenc     I did not discuss this with the patient during his visit     I have staff messaged my MA to call the patient and let him know we need to follow up on this, if he had any questions or concerns I  would be happy to discuss with him   Disposition: as above, [place] office is much more convenient for him, will have him f/u there in 2-3 mo, sooner if needed.  We will request his labs from his PMD    Current medicines are reviewed at length with the patient today.  The patient did not have any concerns regarding medicines.  Venetia Night, PA-C 08/18/2019 6:12 PM     [place] [address] [phone] (office)  [phone] (fax)

## 2019-08-18 ENCOUNTER — Telehealth: Payer: Self-pay | Admitting: Physician Assistant

## 2019-08-18 ENCOUNTER — Ambulatory Visit: Payer: PPO | Admitting: Physician Assistant

## 2019-08-18 ENCOUNTER — Other Ambulatory Visit: Payer: Self-pay

## 2019-08-18 VITALS — BP 144/80 | HR 50 | Ht 72.0 in | Wt 284.0 lb

## 2019-08-18 DIAGNOSIS — R0600 Dyspnea, unspecified: Secondary | ICD-10-CM | POA: Diagnosis not present

## 2019-08-18 DIAGNOSIS — I10 Essential (primary) hypertension: Secondary | ICD-10-CM

## 2019-08-18 DIAGNOSIS — I4891 Unspecified atrial fibrillation: Secondary | ICD-10-CM | POA: Diagnosis not present

## 2019-08-18 DIAGNOSIS — R0789 Other chest pain: Secondary | ICD-10-CM

## 2019-08-18 DIAGNOSIS — I493 Ventricular premature depolarization: Secondary | ICD-10-CM

## 2019-08-18 DIAGNOSIS — I495 Sick sinus syndrome: Secondary | ICD-10-CM | POA: Diagnosis not present

## 2019-08-18 MED ORDER — METOPROLOL TARTRATE 25 MG PO TABS: 25.0000 mg | ORAL_TABLET | Freq: Two times a day (BID) | ORAL | 2 refills | Status: DC

## 2019-08-18 NOTE — Telephone Encounter (Signed)
Pt called to let this office know that his wife will be coming with him to this appointment.

## 2019-08-18 NOTE — Patient Instructions (Addendum)
Medication Instructions:   START TAKING: METOPROLOL 25 MG TWICE A DAY    *If you need a refill on your cardiac medications before your next appointment, please call your pharmacy*  Lab Work: NONE ORDERED  TODAY   If you have labs (blood work) drawn today and your tests are completely normal, you will receive your results only by: Marland Kitchen MyChart Message (if you have MyChart) OR . A paper copy in the mail If you have any lab test that is abnormal or we need to change your treatment, we will call you to review the results.  Testing/Procedures: Your physician has requested that you have an echocardiogram. Echocardiography is a painless test that uses sound waves to create images of your heart. It provides your doctor with information about the size and shape of your heart and how well your heart's chambers and valves are working. This procedure takes approximately one hour. There are no restrictions for this procedure.   Follow-Up: At [facility], you and your health needs are our priority.  As part of our continuing mission to provide you with exceptional heart care, we have created designated Provider Care Teams.  These Care Teams include your primary Cardiologist (physician) and Advanced Practice Providers (APPs -  Physician Assistants and Nurse Practitioners) who all work together to provide you with the care you need, when you need it.  Your next appointment:   3 month(s)  The format for your next appointment:   In Person  Provider:  DR Caryl Comes IN 3 MONTHS    Other Instructions

## 2019-08-19 ENCOUNTER — Telehealth: Payer: Self-pay | Admitting: Physician Assistant

## 2019-08-19 MED ORDER — METOPROLOL TARTRATE 25 MG PO TABS: 25.0000 mg | ORAL_TABLET | Freq: Two times a day (BID) | ORAL | 2 refills | Status: DC

## 2019-08-19 NOTE — Telephone Encounter (Signed)
Contact pharmacy with refill department clarify location of pharmacy in [place] not [place] as suggested online and phone voicemail system.

## 2019-08-26 ENCOUNTER — Telehealth: Payer: Self-pay | Admitting: Internal Medicine

## 2019-08-26 NOTE — Telephone Encounter (Signed)
**Note De-Identified Steve Buck Obfuscation** Will forward to our social worker Raquel Sarna or Tammy Sours to callm the pt with assistance.

## 2019-08-26 NOTE — Telephone Encounter (Signed)
    Pt c/o medication issue:  1. Name of Medication: rivaroxaban (XARELTO) 20 MG TABS tablet  2. How are you currently taking this medication (dosage and times per day)? As written  3. Are you having a reaction (difficulty breathing--STAT)? no  4. What is your medication issue? Patient called to report med to costly, Needs assistance completing assistance form

## 2019-08-29 ENCOUNTER — Telehealth (HOSPITAL_COMMUNITY): Payer: Self-pay | Admitting: Licensed Clinical Social Worker

## 2019-08-29 NOTE — Telephone Encounter (Signed)
Call to patient to give him update on needing to resubmit form for patient assistance.   He was able to [facility] month supply [facility].   Will resubmit provider paper after it is signed by Ignacia Bayley, NP.

## 2019-08-29 NOTE — Telephone Encounter (Signed)
Returned call to patient. He reports that he received a letter in the mail about more information needed to process medication assistance form. He currently has 5 days left.   I told patient I would reach out to [employer]. I asked him to call [facility], as I believe they still have the 1 month free coupon that I called into them in October.   Made call to [employer]. They reported since something was crossed out on the provider page, it would need to be resubmitted.   Fax number provided. When I see the provider I will get it signed and faxed over. Ignacia Bayley, NP not in office today.

## 2019-08-29 NOTE — Telephone Encounter (Signed)
CSW consulted to speak with pt regarding Xarelto copay cost concerns.  Pt reports that he pays $45/month for his medications and this was difficult for him to manage.    For Xarelto $45/month is on the lower end of cost and don't think that a tier exception would help with this price.  Pt reports his [place] MD office is already completing the patient assistance application- CSW explained that he will likely be rejected at first but to make sure he calls to see what his copay requirement is as it could help him later in the year.  CSW then inquired about assisting pt in applying for the Extra Help program application as patient reports getting $1000/month so could potentially be a candidate as long as his wife's income doesn't set them over. CSW attempted to apply with patient but he was unable to provide his wifes SS#- CSW instead sent patient a link to apply and encouraged him to call if he and his wife struggled with it.  Jorge Ny, [facility] Desk#: [phone] Cell#: [phone]

## 2019-08-29 NOTE — Telephone Encounter (Signed)
Patient calling States he has been speaking with Iva on assistance forms Has received a letter and would like to discuss with Iva Please call to discuss

## 2019-08-31 NOTE — Telephone Encounter (Signed)
Resubmitted physician sheet via fax after being signed by Ignacia Bayley, NP.

## 2019-09-04 NOTE — Addendum Note (Signed)
Addended by: Patsey Berthold on: 09/04/2019 07:44 PM   Modules accepted: Level of Service

## 2019-09-05 NOTE — Telephone Encounter (Signed)
Call to [place] to see if patient has qualified for assistance. They said it had been declined due to not paying max out of pocket expenditure which is $1617.60.  Call to patient to make him aware. He said he had not hit that amount. I see in the chart that Tammy Sours, LCSW had also been looking to help patient w medication expense.   I reached out to her and left a VM.

## 2019-09-06 NOTE — Telephone Encounter (Signed)
Spoke to patient after speaking with social worker [place].   She said since patient is only spending $45/m on medication that it would be pointless to try for tier exception. She is helping patient out with other options for med assistance.   Pt was appreciative of call and said he had phone number for Romeo, LCSW.   Advised pt to call for any further questions or concerns.

## 2019-09-06 NOTE — Telephone Encounter (Signed)
Eliezer Lofts ,  LCSW, calling to discuss patients assistance for medication.   Please call back at [phone] when able to discuss

## 2019-09-08 ENCOUNTER — Ambulatory Visit (INDEPENDENT_AMBULATORY_CARE_PROVIDER_SITE_OTHER): Payer: PPO | Admitting: *Deleted

## 2019-09-08 ENCOUNTER — Telehealth: Payer: Self-pay

## 2019-09-08 DIAGNOSIS — R001 Bradycardia, unspecified: Secondary | ICD-10-CM | POA: Diagnosis not present

## 2019-09-08 LAB — CUP PACEART REMOTE DEVICE CHECK
Date Time Interrogation Session: 20201223230832
Implantable Pulse Generator Implant Date: 20191024

## 2019-09-08 NOTE — Telephone Encounter (Signed)
The pt states when he tried to do a transmission the receiver would not do anything. I gave him the number to [facility] support.

## 2019-09-08 NOTE — Telephone Encounter (Signed)
Manual transmission requested for review of un-transmitted symptom episodes noted on monthly Summary Report.

## 2019-09-11 NOTE — Progress Notes (Signed)
ILR remote 

## 2019-09-15 ENCOUNTER — Other Ambulatory Visit: Payer: Self-pay

## 2019-09-15 ENCOUNTER — Ambulatory Visit (INDEPENDENT_AMBULATORY_CARE_PROVIDER_SITE_OTHER): Payer: PPO

## 2019-09-15 DIAGNOSIS — R0600 Dyspnea, unspecified: Secondary | ICD-10-CM | POA: Diagnosis not present

## 2019-09-27 DIAGNOSIS — Z20822 Contact with and (suspected) exposure to covid-19: Secondary | ICD-10-CM | POA: Diagnosis not present

## 2019-10-14 ENCOUNTER — Telehealth: Payer: Self-pay

## 2019-10-14 NOTE — Telephone Encounter (Signed)
ILR alert received for SB, additional noted events including AF and Pause.  The pause appears to be false as ILR picked up PVC and undersensed next Rwave (see below).   Pt has known AF on [place].  Pt medications include Metoprolol 25mg  BID, Clonidine 0.3mg   BID, Amlodipine 10mg , Losartan 50mg  Daily.    Spoke with pt, he confirms that he was sleeping at time of AF and bradycardic episodes.  He has not been tested for OSA.  He frequently feels dizzy and has reported this in previous office visits.    Advised will forward info to MD for review and recommendations.        Documented as Pause:

## 2019-10-14 NOTE — Telephone Encounter (Signed)
I asked the pt to send a manual transmission because his monitor sent an Alert per [employer]. Manual transmission received. I told him if the nurse do not call him today, she will call him on Monday.

## 2019-11-10 ENCOUNTER — Ambulatory Visit (INDEPENDENT_AMBULATORY_CARE_PROVIDER_SITE_OTHER): Payer: PPO | Admitting: *Deleted

## 2019-11-10 DIAGNOSIS — R001 Bradycardia, unspecified: Secondary | ICD-10-CM

## 2019-11-10 LAB — CUP PACEART REMOTE DEVICE CHECK
Date Time Interrogation Session: 20210225000316
Implantable Pulse Generator Implant Date: 20191024

## 2019-11-10 NOTE — Progress Notes (Signed)
ILR Remote 

## 2019-11-17 ENCOUNTER — Ambulatory Visit: Payer: PPO | Admitting: Internal Medicine

## 2019-11-24 ENCOUNTER — Ambulatory Visit (INDEPENDENT_AMBULATORY_CARE_PROVIDER_SITE_OTHER): Payer: PPO | Admitting: Internal Medicine

## 2019-11-24 ENCOUNTER — Other Ambulatory Visit: Payer: Self-pay

## 2019-11-24 ENCOUNTER — Encounter: Payer: Self-pay | Admitting: Internal Medicine

## 2019-11-24 VITALS — BP 120/80 | HR 52 | Ht 72.0 in | Wt 297.4 lb

## 2019-11-24 DIAGNOSIS — I4891 Unspecified atrial fibrillation: Secondary | ICD-10-CM | POA: Diagnosis not present

## 2019-11-24 DIAGNOSIS — I493 Ventricular premature depolarization: Secondary | ICD-10-CM

## 2019-11-24 DIAGNOSIS — I10 Essential (primary) hypertension: Secondary | ICD-10-CM | POA: Diagnosis not present

## 2019-11-24 NOTE — Patient Instructions (Signed)
Medication Instructions:  - Your physician has recommended you make the following change in your medication:   1) Stop xarelto  *If you need a refill on your cardiac medications before your next appointment, please call your pharmacy*   Lab Work: - none ordered  If you have labs (blood work) drawn today and your tests are completely normal, you will receive your results only by: Marland Kitchen MyChart Message (if you have MyChart) OR . A paper copy in the mail If you have any lab test that is abnormal or we need to change your treatment, we will call you to review the results.   Testing/Procedures: - none ordered   Follow-Up: At [facility], you and your health needs are our priority.  As part of our continuing mission to provide you with exceptional heart care, we have created designated Provider Care Teams.  These Care Teams include your primary Cardiologist (physician) and Advanced Practice Providers (APPs -  Physician Assistants and Nurse Practitioners) who all work together to provide you with the care you need, when you need it.  We recommend signing up for the patient portal called "MyChart".  Sign up information is provided on this After Visit Summary.  MyChart is used to connect with patients for Virtual Visits (Telemedicine).  Patients are able to view lab/test results, encounter notes, upcoming appointments, etc.  Non-urgent messages can be sent to your provider as well.   To learn more about what you can do with MyChart, go to [email or web address].    Your next appointment:   1 year(s)  The format for your next appointment:   In Person  Provider:   Virl Axe, MD   Other Instructions n/a

## 2019-11-24 NOTE — Progress Notes (Signed)
Patient Care Team: Kirk Ruths, MD as PCP - General (Internal Medicine) Wellington Hampshire, MD as PCP - Cardiology (Cardiology)   HPI  Steve Buck. is a 64 y.o. male Seen in follow-up for PVCs and secondary atrial fibrillation.  It was elected to implant a monitor to look for evidence of recurrent atrial fibrillation rather than embark on anticoagulation not withstanding the BMJ study from the [employer].   He had 3 hours of atrial fibrillation 3/20.  He was referred (by me: (()] In any case, recurrent atrial fibrillation.  Started on Xarelto.  No bleeding  The patient denies chest pain, shortness of breath, nocturnal dyspnea, orthopnea or peripheral edema.  There have been no palpitations, lightheadedness or syncope.   Activity limited by his neuropathy  Wife got Covid -- he is not going to get vaccinated   DATE TEST EF   4/19 MYOVIEW   37 % No ischemia  4/19 Echo   60-65 %   12/19 CTA  CaScore 540 -- FFR neg Ao Root 43    Thromboembolic risk factors ( age -40, HTN-1, DM-1) for a CHADSVASc Score of 3   Records and Results Reviewed   Past Medical History:  Diagnosis Date  . (HFpEF) heart failure with preserved ejection fraction ([name])    a. 12/2017 Echo: EF 60-65%, no rwma, Gr1DD, mild MR, mildly dil LA, nl RV fxn.  . Allergy    Seasonal  . Chest pain    a. 12/2017 Lexiscan MV: EF 37% (nl by echo). Small mild, fixed region of apical thinning, likely attenuation artifact. No ischemia. Low risk.  . CKD (chronic kidney disease) stage 3, GFR 30-59 ml/min 03/30/2018  . Diabetic nephropathy associated with type 2 diabetes mellitus ([name]) 03/25/2018  . DKA (diabetic ketoacidoses) ([name]) 12/01/2017  . Heart murmur    a. 12/2017 Echo: Mild MR.  Marland Kitchen History of Bell's palsy 03/30/2018  . Hypertension   . Morbid obesity ([name])   . Palpitations   . PVC's (premature ventricular contractions)    a. 05/2018 Holter: RSR, avg rate of 60. Freq PVC's (6.4% burden).  Some ventricular bigeminy/trigeminy.  . Type 2 diabetes mellitus, uncontrolled, with neuropathy ([name])     Past Surgical History:  Procedure Laterality Date  . colonoscvopy  2015  . FOOT SURGERY  around 1994  . LOOP RECORDER INSERTION N/A 07/08/2018   Procedure: LOOP RECORDER INSERTION;  Surgeon: Deboraha Sprang, MD;  Location: [facility];  Service: Cardiovascular;  Laterality: N/A;    Current Meds  Medication Sig  . acetaminophen (TYLENOL) 500 MG tablet Take 2 tablets (1,000 mg total) by mouth every 8 (eight) hours as needed.  Marland Kitchen allopurinol (ZYLOPRIM) 300 MG tablet Take 0.5 tablets (150 mg total) by mouth daily.  Marland Kitchen amLODipine (NORVASC) 10 MG tablet Take 1 tablet (10 mg total) by mouth daily.  Marland Kitchen atorvastatin (LIPITOR) 10 MG tablet Take 1 tablet (10 mg total) by mouth daily.  . cloNIDine (CATAPRES) 0.3 MG tablet Take 1 tablet (0.3 mg total) by mouth 2 (two) times daily.  . colchicine 0.6 MG tablet Take 2 tablets (1.2 mg total) by mouth daily. Take 1.2mg (2 tablets) po x1 and then 0.6 mg 1 hr later x1 (Patient taking differently: Take 0.6 mg by mouth as needed. )  . diclofenac sodium (VOLTAREN) 1 % GEL Apply 2 g topically 4 (four) times daily.  Marland Kitchen escitalopram (LEXAPRO) 10 MG tablet Take 10 mg by mouth  daily.  . gabapentin (NEURONTIN) 100 MG capsule Take 2 capsules (200 mg total) by mouth 2 (two) times daily.  Marland Kitchen gabapentin (NEURONTIN) 300 MG capsule Take 1 capsule (300 mg total) by mouth at bedtime.  Marland Kitchen glucose blood test strip 1 each by Other route 3 (three) times daily. Use as instructed  . hydrochlorothiazide (HYDRODIURIL) 25 MG tablet Take 1 tablet (25 mg total) by mouth daily.  . Lancets MISC 1 each by Does not apply route 3 (three) times daily.  Marland Kitchen losartan (COZAAR) 50 MG tablet Take 1 tablet (50 mg total) by mouth at bedtime.  . metFORMIN (GLUCOPHAGE) 500 MG tablet Take 1 tablet (500 mg total) by mouth 2 (two) times daily with a meal.  . metoprolol tartrate (LOPRESSOR) 25 MG  tablet Take 1 tablet (25 mg total) by mouth 2 (two) times daily.  . polyethylene glycol powder (GLYCOLAX/MIRALAX) 17 GM/SCOOP powder Take 17 g by mouth as needed.  . potassium chloride (KLOR-CON) 10 MEQ tablet Take 10 mEq by mouth daily.  . psyllium (METAMUCIL) 58.6 % powder Take 1 packet by mouth daily.  . rivaroxaban (XARELTO) 20 MG TABS tablet Take 1 tablet (20 mg total) by mouth daily with supper.    Allergies  Allergen Reactions  . Lisinopril Rash and Cough      Review of Systems negative except from HPI and PMH  Physical Exam BP 120/80 (BP Location: Left Arm, Patient Position: Sitting, Cuff Size: Large)   Pulse (!) 52   Ht 6' (1.829 m)   Wt 297 lb 6 oz (134.9 kg)   SpO2 98%   BMI 40.33 kg/m  Well developed and nourished in no acute distress HENT normal Neck supple with JVP-  Flat Clear Regular rate and rhythm, no murmurs or gallops Abd-soft with active BS No Clubbing cyanosis edema Skin-warm and dry A & Oriented  Grossly normal sensory and motor function  Loop recorder interrogated ><> interval afib and some bradycardia Device reprogrammed to brady at 30 AFib already at 60 min   Assessment and  Plan  PVCs left bundle inferior axis with a QS in lead aVR and R wave in lead aVL-delayed intrinsicoid deflection  Hypertension  Atrial fibrillation-secondary/SCAF  Obesity  Anxiety   Aortic enlargement  Gynecomastia Left  Interval atrial fibrillation of duration up to 3 hours.  This would qualify as I think in the context of prior atrial fibrillation being secondary.  Hence, we have discontinued his Xarelto  Blood pressures well controlled.  Have encouraged exercise.  PVCs quiescient.  Device reprogrammed as above

## 2019-12-11 LAB — CUP PACEART REMOTE DEVICE CHECK
Date Time Interrogation Session: 20210328023145
Implantable Pulse Generator Implant Date: 20191024

## 2019-12-12 ENCOUNTER — Ambulatory Visit (INDEPENDENT_AMBULATORY_CARE_PROVIDER_SITE_OTHER): Payer: PPO | Admitting: *Deleted

## 2019-12-12 DIAGNOSIS — R001 Bradycardia, unspecified: Secondary | ICD-10-CM | POA: Diagnosis not present

## 2019-12-12 NOTE — Progress Notes (Signed)
ILR Remote 

## 2020-01-11 ENCOUNTER — Telehealth: Payer: Self-pay

## 2020-01-11 NOTE — Telephone Encounter (Signed)
Spoke with patient to inform of disconnected monitor. °

## 2020-02-14 ENCOUNTER — Ambulatory Visit (INDEPENDENT_AMBULATORY_CARE_PROVIDER_SITE_OTHER): Payer: PPO | Admitting: *Deleted

## 2020-02-14 DIAGNOSIS — I4891 Unspecified atrial fibrillation: Secondary | ICD-10-CM | POA: Diagnosis not present

## 2020-02-14 LAB — CUP PACEART REMOTE DEVICE CHECK
Date Time Interrogation Session: 20210529233452
Implantable Pulse Generator Implant Date: 20191024

## 2020-02-15 NOTE — Progress Notes (Signed)
Carelink Summary Report / Loop Recorder 

## 2020-03-02 DIAGNOSIS — Z8639 Personal history of other endocrine, nutritional and metabolic disease: Secondary | ICD-10-CM | POA: Diagnosis not present

## 2020-03-02 DIAGNOSIS — N1832 Chronic kidney disease, stage 3b: Secondary | ICD-10-CM | POA: Diagnosis not present

## 2020-03-02 DIAGNOSIS — Z0000 Encounter for general adult medical examination without abnormal findings: Secondary | ICD-10-CM | POA: Diagnosis not present

## 2020-03-02 DIAGNOSIS — I10 Essential (primary) hypertension: Secondary | ICD-10-CM | POA: Diagnosis not present

## 2020-03-02 DIAGNOSIS — I5032 Chronic diastolic (congestive) heart failure: Secondary | ICD-10-CM | POA: Diagnosis not present

## 2020-03-02 DIAGNOSIS — E1142 Type 2 diabetes mellitus with diabetic polyneuropathy: Secondary | ICD-10-CM | POA: Diagnosis not present

## 2020-03-02 DIAGNOSIS — Z8669 Personal history of other diseases of the nervous system and sense organs: Secondary | ICD-10-CM | POA: Diagnosis not present

## 2020-03-02 DIAGNOSIS — I480 Paroxysmal atrial fibrillation: Secondary | ICD-10-CM | POA: Diagnosis not present

## 2020-03-02 DIAGNOSIS — E1122 Type 2 diabetes mellitus with diabetic chronic kidney disease: Secondary | ICD-10-CM | POA: Diagnosis not present

## 2020-03-16 ENCOUNTER — Other Ambulatory Visit: Payer: Self-pay | Admitting: Internal Medicine

## 2020-03-16 ENCOUNTER — Ambulatory Visit (INDEPENDENT_AMBULATORY_CARE_PROVIDER_SITE_OTHER): Payer: PPO | Admitting: *Deleted

## 2020-03-16 DIAGNOSIS — I4891 Unspecified atrial fibrillation: Secondary | ICD-10-CM | POA: Diagnosis not present

## 2020-03-16 LAB — CUP PACEART REMOTE DEVICE CHECK
Date Time Interrogation Session: 20210701230357
Implantable Pulse Generator Implant Date: 20191024

## 2020-03-16 NOTE — Telephone Encounter (Signed)
This is a [place] pt 

## 2020-03-20 NOTE — Progress Notes (Signed)
Carelink Summary Report / Loop Recorder 

## 2020-04-21 LAB — CUP PACEART REMOTE DEVICE CHECK
Date Time Interrogation Session: 20210803231538
Implantable Pulse Generator Implant Date: 20191024

## 2020-04-23 ENCOUNTER — Ambulatory Visit (INDEPENDENT_AMBULATORY_CARE_PROVIDER_SITE_OTHER): Payer: PPO | Admitting: *Deleted

## 2020-04-23 DIAGNOSIS — I4891 Unspecified atrial fibrillation: Secondary | ICD-10-CM | POA: Diagnosis not present

## 2020-04-24 NOTE — Progress Notes (Signed)
Carelink Summary Report / Loop Recorder 

## 2020-06-08 ENCOUNTER — Other Ambulatory Visit: Payer: Self-pay | Admitting: Physician Assistant

## 2020-06-28 DIAGNOSIS — N1832 Chronic kidney disease, stage 3b: Secondary | ICD-10-CM | POA: Diagnosis not present

## 2020-06-28 DIAGNOSIS — I5032 Chronic diastolic (congestive) heart failure: Secondary | ICD-10-CM | POA: Diagnosis not present

## 2020-06-28 DIAGNOSIS — E1122 Type 2 diabetes mellitus with diabetic chronic kidney disease: Secondary | ICD-10-CM | POA: Diagnosis not present

## 2020-06-28 DIAGNOSIS — I10 Essential (primary) hypertension: Secondary | ICD-10-CM | POA: Diagnosis not present

## 2020-08-29 ENCOUNTER — Telehealth: Payer: Self-pay | Admitting: Emergency Medicine

## 2020-08-29 NOTE — Telephone Encounter (Signed)
LMOM of wife per DPR. No working # for patient. Need remote transmission from [facility] . Received report that had no details.

## 2020-09-04 NOTE — Telephone Encounter (Signed)
Called to request manual transmission. No answer, unable to leave VM.

## 2020-09-05 NOTE — Telephone Encounter (Signed)
Attempted to reach pt, no answer/ no VM on his number.  LVM on [facility] phone number requesting she have patient call us,

## 2020-09-06 ENCOUNTER — Encounter: Payer: Self-pay | Admitting: Emergency Medicine

## 2020-09-06 NOTE — Telephone Encounter (Signed)
Attempted to contact patient by phone in reference to sending a remote transmission. Patient not available and unable to left a message on patient's voicemail to to phone rang continuously.   Will send certified letter to patient.

## 2020-10-01 ENCOUNTER — Ambulatory Visit (INDEPENDENT_AMBULATORY_CARE_PROVIDER_SITE_OTHER): Payer: PPO

## 2020-10-01 DIAGNOSIS — R002 Palpitations: Secondary | ICD-10-CM | POA: Diagnosis not present

## 2020-10-03 LAB — CUP PACEART REMOTE DEVICE CHECK
Date Time Interrogation Session: 20220115224800
Implantable Pulse Generator Implant Date: 20191024

## 2020-10-15 NOTE — Progress Notes (Signed)
Carelink Summary Report / Loop Recorder 

## 2020-10-17 ENCOUNTER — Telehealth: Payer: Self-pay

## 2020-10-17 DIAGNOSIS — Z23 Encounter for immunization: Secondary | ICD-10-CM | POA: Diagnosis not present

## 2020-10-17 DIAGNOSIS — I5032 Chronic diastolic (congestive) heart failure: Secondary | ICD-10-CM | POA: Diagnosis not present

## 2020-10-17 DIAGNOSIS — Z135 Encounter for screening for eye and ear disorders: Secondary | ICD-10-CM | POA: Diagnosis not present

## 2020-10-17 DIAGNOSIS — E782 Mixed hyperlipidemia: Secondary | ICD-10-CM | POA: Diagnosis not present

## 2020-10-17 DIAGNOSIS — Z0000 Encounter for general adult medical examination without abnormal findings: Secondary | ICD-10-CM | POA: Diagnosis not present

## 2020-10-17 DIAGNOSIS — I10 Essential (primary) hypertension: Secondary | ICD-10-CM | POA: Diagnosis not present

## 2020-10-17 DIAGNOSIS — E1142 Type 2 diabetes mellitus with diabetic polyneuropathy: Secondary | ICD-10-CM | POA: Diagnosis not present

## 2020-10-17 DIAGNOSIS — N1832 Chronic kidney disease, stage 3b: Secondary | ICD-10-CM | POA: Diagnosis not present

## 2020-10-17 DIAGNOSIS — E1122 Type 2 diabetes mellitus with diabetic chronic kidney disease: Secondary | ICD-10-CM | POA: Diagnosis not present

## 2020-10-17 DIAGNOSIS — I480 Paroxysmal atrial fibrillation: Secondary | ICD-10-CM | POA: Diagnosis not present

## 2020-10-17 NOTE — Telephone Encounter (Signed)
[facility] alert received Alert for pause event stored at 12:08 AM; ECG shows SB w/ bigeminal PVCs followed by compensatory pauses w/ longest being ~3.5 seconds in length.   Attempted to reach pt, pt number is not a valid number;  Attempted to reach spouse, her number goes straight to unidentified VM (DPR on file does not indicate if ok to leave VM for her).    Noted that there have been previous unsuccessful  attempted to reach pt recently.   A manual transmission is needed to fill in nearly 20 missing episodes.   Certified letter mailed to pt on 09/06/20.    Pt has recall in for appt to be scheduled in March 2022 with Dr. Caryl Comes.

## 2020-10-18 NOTE — Telephone Encounter (Signed)
Noted and thnaks for effort I would not pursue further

## 2020-11-01 ENCOUNTER — Ambulatory Visit (INDEPENDENT_AMBULATORY_CARE_PROVIDER_SITE_OTHER): Payer: PPO

## 2020-11-01 DIAGNOSIS — R002 Palpitations: Secondary | ICD-10-CM

## 2020-11-02 LAB — CUP PACEART REMOTE DEVICE CHECK
Date Time Interrogation Session: 20220217225104
Implantable Pulse Generator Implant Date: 20191024

## 2020-11-08 NOTE — Progress Notes (Signed)
Carelink Summary Report / Loop Recorder 

## 2020-11-27 DIAGNOSIS — E119 Type 2 diabetes mellitus without complications: Secondary | ICD-10-CM | POA: Diagnosis not present

## 2021-02-13 DIAGNOSIS — E1122 Type 2 diabetes mellitus with diabetic chronic kidney disease: Secondary | ICD-10-CM | POA: Diagnosis not present

## 2021-02-13 DIAGNOSIS — N1832 Chronic kidney disease, stage 3b: Secondary | ICD-10-CM | POA: Diagnosis not present

## 2021-02-13 DIAGNOSIS — I10 Essential (primary) hypertension: Secondary | ICD-10-CM | POA: Diagnosis not present

## 2021-02-13 DIAGNOSIS — I5032 Chronic diastolic (congestive) heart failure: Secondary | ICD-10-CM | POA: Diagnosis not present

## 2021-02-20 DIAGNOSIS — E1122 Type 2 diabetes mellitus with diabetic chronic kidney disease: Secondary | ICD-10-CM | POA: Diagnosis not present

## 2021-02-20 DIAGNOSIS — I480 Paroxysmal atrial fibrillation: Secondary | ICD-10-CM | POA: Diagnosis not present

## 2021-02-20 DIAGNOSIS — M25562 Pain in left knee: Secondary | ICD-10-CM | POA: Diagnosis not present

## 2021-02-20 DIAGNOSIS — E1142 Type 2 diabetes mellitus with diabetic polyneuropathy: Secondary | ICD-10-CM | POA: Diagnosis not present

## 2021-02-20 DIAGNOSIS — I5032 Chronic diastolic (congestive) heart failure: Secondary | ICD-10-CM | POA: Diagnosis not present

## 2021-02-20 DIAGNOSIS — N1832 Chronic kidney disease, stage 3b: Secondary | ICD-10-CM | POA: Diagnosis not present

## 2021-02-20 DIAGNOSIS — I10 Essential (primary) hypertension: Secondary | ICD-10-CM | POA: Diagnosis not present

## 2021-02-22 DIAGNOSIS — M25562 Pain in left knee: Secondary | ICD-10-CM | POA: Diagnosis not present

## 2021-02-22 DIAGNOSIS — M1712 Unilateral primary osteoarthritis, left knee: Secondary | ICD-10-CM | POA: Diagnosis not present

## 2021-03-28 DIAGNOSIS — M9903 Segmental and somatic dysfunction of lumbar region: Secondary | ICD-10-CM | POA: Diagnosis not present

## 2021-03-28 DIAGNOSIS — M5127 Other intervertebral disc displacement, lumbosacral region: Secondary | ICD-10-CM | POA: Diagnosis not present

## 2021-03-28 DIAGNOSIS — M9904 Segmental and somatic dysfunction of sacral region: Secondary | ICD-10-CM | POA: Diagnosis not present

## 2021-03-28 DIAGNOSIS — M62830 Muscle spasm of back: Secondary | ICD-10-CM | POA: Diagnosis not present

## 2021-03-29 DIAGNOSIS — M9903 Segmental and somatic dysfunction of lumbar region: Secondary | ICD-10-CM | POA: Diagnosis not present

## 2021-03-29 DIAGNOSIS — M9904 Segmental and somatic dysfunction of sacral region: Secondary | ICD-10-CM | POA: Diagnosis not present

## 2021-03-29 DIAGNOSIS — M62830 Muscle spasm of back: Secondary | ICD-10-CM | POA: Diagnosis not present

## 2021-03-29 DIAGNOSIS — M5127 Other intervertebral disc displacement, lumbosacral region: Secondary | ICD-10-CM | POA: Diagnosis not present

## 2021-04-01 DIAGNOSIS — M62830 Muscle spasm of back: Secondary | ICD-10-CM | POA: Diagnosis not present

## 2021-04-01 DIAGNOSIS — M9904 Segmental and somatic dysfunction of sacral region: Secondary | ICD-10-CM | POA: Diagnosis not present

## 2021-04-01 DIAGNOSIS — M9903 Segmental and somatic dysfunction of lumbar region: Secondary | ICD-10-CM | POA: Diagnosis not present

## 2021-04-01 DIAGNOSIS — M5127 Other intervertebral disc displacement, lumbosacral region: Secondary | ICD-10-CM | POA: Diagnosis not present

## 2021-04-02 DIAGNOSIS — M9903 Segmental and somatic dysfunction of lumbar region: Secondary | ICD-10-CM | POA: Diagnosis not present

## 2021-04-02 DIAGNOSIS — M5127 Other intervertebral disc displacement, lumbosacral region: Secondary | ICD-10-CM | POA: Diagnosis not present

## 2021-04-02 DIAGNOSIS — M62830 Muscle spasm of back: Secondary | ICD-10-CM | POA: Diagnosis not present

## 2021-04-02 DIAGNOSIS — M9904 Segmental and somatic dysfunction of sacral region: Secondary | ICD-10-CM | POA: Diagnosis not present

## 2021-04-04 DIAGNOSIS — M9903 Segmental and somatic dysfunction of lumbar region: Secondary | ICD-10-CM | POA: Diagnosis not present

## 2021-04-04 DIAGNOSIS — M5127 Other intervertebral disc displacement, lumbosacral region: Secondary | ICD-10-CM | POA: Diagnosis not present

## 2021-04-04 DIAGNOSIS — M62830 Muscle spasm of back: Secondary | ICD-10-CM | POA: Diagnosis not present

## 2021-04-04 DIAGNOSIS — M9904 Segmental and somatic dysfunction of sacral region: Secondary | ICD-10-CM | POA: Diagnosis not present

## 2021-04-09 DIAGNOSIS — M5127 Other intervertebral disc displacement, lumbosacral region: Secondary | ICD-10-CM | POA: Diagnosis not present

## 2021-04-09 DIAGNOSIS — M62830 Muscle spasm of back: Secondary | ICD-10-CM | POA: Diagnosis not present

## 2021-04-09 DIAGNOSIS — M9904 Segmental and somatic dysfunction of sacral region: Secondary | ICD-10-CM | POA: Diagnosis not present

## 2021-04-09 DIAGNOSIS — M9903 Segmental and somatic dysfunction of lumbar region: Secondary | ICD-10-CM | POA: Diagnosis not present

## 2021-04-12 DIAGNOSIS — N183 Chronic kidney disease, stage 3 unspecified: Secondary | ICD-10-CM | POA: Diagnosis not present

## 2021-04-12 DIAGNOSIS — E1122 Type 2 diabetes mellitus with diabetic chronic kidney disease: Secondary | ICD-10-CM | POA: Diagnosis not present

## 2021-04-12 DIAGNOSIS — L97911 Non-pressure chronic ulcer of unspecified part of right lower leg limited to breakdown of skin: Secondary | ICD-10-CM | POA: Diagnosis not present

## 2021-04-12 DIAGNOSIS — Z23 Encounter for immunization: Secondary | ICD-10-CM | POA: Diagnosis not present

## 2021-04-18 DIAGNOSIS — L97911 Non-pressure chronic ulcer of unspecified part of right lower leg limited to breakdown of skin: Secondary | ICD-10-CM | POA: Diagnosis not present

## 2021-06-12 DIAGNOSIS — D123 Benign neoplasm of transverse colon: Secondary | ICD-10-CM | POA: Diagnosis not present

## 2021-06-12 DIAGNOSIS — K635 Polyp of colon: Secondary | ICD-10-CM | POA: Diagnosis not present

## 2021-06-12 DIAGNOSIS — Z86010 Personal history of colonic polyps: Secondary | ICD-10-CM | POA: Diagnosis not present

## 2021-06-12 DIAGNOSIS — D125 Benign neoplasm of sigmoid colon: Secondary | ICD-10-CM | POA: Diagnosis not present

## 2021-06-12 DIAGNOSIS — Z1211 Encounter for screening for malignant neoplasm of colon: Secondary | ICD-10-CM | POA: Diagnosis not present

## 2021-06-12 DIAGNOSIS — I10 Essential (primary) hypertension: Secondary | ICD-10-CM | POA: Diagnosis not present

## 2021-06-12 DIAGNOSIS — K648 Other hemorrhoids: Secondary | ICD-10-CM | POA: Diagnosis not present

## 2021-06-12 DIAGNOSIS — E785 Hyperlipidemia, unspecified: Secondary | ICD-10-CM | POA: Diagnosis not present

## 2021-06-14 ENCOUNTER — Emergency Department
Admission: EM | Admit: 2021-06-14 | Discharge: 2021-06-14 | Disposition: A | Discharge status: Home / Self Care | Payer: PPO | Attending: Emergency Medicine | Admitting: Emergency Medicine

## 2021-06-14 ENCOUNTER — Other Ambulatory Visit: Payer: Self-pay

## 2021-06-14 DIAGNOSIS — I10 Essential (primary) hypertension: Secondary | ICD-10-CM | POA: Insufficient documentation

## 2021-06-14 DIAGNOSIS — Z5321 Procedure and treatment not carried out due to patient leaving prior to being seen by health care provider: Secondary | ICD-10-CM | POA: Diagnosis not present

## 2021-06-14 DIAGNOSIS — R519 Headache, unspecified: Secondary | ICD-10-CM | POA: Insufficient documentation

## 2021-06-14 NOTE — ED Triage Notes (Signed)
Pt here with hypertension. Pt has hx of same. Pt states that this morning his cp was 180/120 and came down to 160/101 after taking his medication. Pt states that his bp has since been higher in the 200s. Otherwise pt stable.

## 2021-06-14 NOTE — ED Provider Notes (Cosign Needed)
Emergency Medicine Provider Triage Evaluation Note  [name], a 65 y.o. male  was evaluated in triage.  Pt complains of high blood pressures reading despite treatment for HTN.  Review of Systems  Positive: Headache intermittently Negative: CP, SOB  Physical Exam  BP (!) 182/113 (BP Location: Left Arm)   Pulse (!) 57   Temp 98.1 F (36.7 C) (Oral)   Resp 18   Ht 6\' 1"  (1.854 m)   Wt 126.6 kg   SpO2 96%   BMI 36.81 kg/m  Gen:   Awake, no distress  NAD Resp:  Normal effort CTA MSK:   Moves extremities without difficulty  Other:  CVS; RRR  Medical Decision Making  Medically screening exam initiated at 3:09 PM.  Appropriate orders placed.  Emigdio Jerilynn Mages Dineen Kid. was informed that the remainder of the evaluation will be completed by another provider, this initial triage assessment does not replace that evaluation, and the importance of remaining in the ED until their evaluation is complete.  Patient with ED evaluation of elevated BP readings.    Melvenia Needles, PA-C 06/14/21 1511

## 2021-06-15 ENCOUNTER — Observation Stay
Admission: EM | Admit: 2021-06-15 | Discharge: 2021-06-16 | Disposition: A | Discharge status: Home / Self Care | Payer: PPO | Attending: Hospitalist | Admitting: Hospitalist

## 2021-06-15 ENCOUNTER — Observation Stay
Admit: 2021-06-15 | Discharge: 2021-06-15 | Disposition: A | Discharge status: Home / Self Care | Payer: PPO | Attending: Family Medicine | Admitting: Family Medicine

## 2021-06-15 ENCOUNTER — Emergency Department: Payer: PPO

## 2021-06-15 DIAGNOSIS — Z23 Encounter for immunization: Secondary | ICD-10-CM | POA: Insufficient documentation

## 2021-06-15 DIAGNOSIS — R079 Chest pain, unspecified: Secondary | ICD-10-CM

## 2021-06-15 DIAGNOSIS — E876 Hypokalemia: Secondary | ICD-10-CM | POA: Insufficient documentation

## 2021-06-15 DIAGNOSIS — E119 Type 2 diabetes mellitus without complications: Secondary | ICD-10-CM | POA: Insufficient documentation

## 2021-06-15 DIAGNOSIS — Z79899 Other long term (current) drug therapy: Secondary | ICD-10-CM | POA: Diagnosis not present

## 2021-06-15 DIAGNOSIS — R0789 Other chest pain: Secondary | ICD-10-CM | POA: Diagnosis not present

## 2021-06-15 DIAGNOSIS — Z20822 Contact with and (suspected) exposure to covid-19: Secondary | ICD-10-CM | POA: Diagnosis not present

## 2021-06-15 DIAGNOSIS — Z7984 Long term (current) use of oral hypoglycemic drugs: Secondary | ICD-10-CM | POA: Insufficient documentation

## 2021-06-15 DIAGNOSIS — N183 Chronic kidney disease, stage 3 unspecified: Secondary | ICD-10-CM | POA: Insufficient documentation

## 2021-06-15 DIAGNOSIS — I161 Hypertensive emergency: Principal | ICD-10-CM | POA: Diagnosis present

## 2021-06-15 DIAGNOSIS — I200 Unstable angina: Secondary | ICD-10-CM | POA: Diagnosis not present

## 2021-06-15 DIAGNOSIS — I10 Essential (primary) hypertension: Secondary | ICD-10-CM

## 2021-06-15 DIAGNOSIS — R7989 Other specified abnormal findings of blood chemistry: Secondary | ICD-10-CM | POA: Insufficient documentation

## 2021-06-15 LAB — BASIC METABOLIC PANEL
Anion gap: 10 (ref 5–15)
BUN: 35 mg/dL — ABNORMAL HIGH (ref 8–23)
CO2: 27 mmol/L (ref 22–32)
Calcium: 9.7 mg/dL (ref 8.9–10.3)
Chloride: 101 mmol/L (ref 98–111)
Creatinine, Ser: 1.7 mg/dL — ABNORMAL HIGH (ref 0.61–1.24)
GFR, Estimated: 44 mL/min — ABNORMAL LOW (ref >60)
Glucose, Bld: 120 mg/dL — ABNORMAL HIGH (ref 70–99)
Potassium: 3 mmol/L — ABNORMAL LOW (ref 3.5–5.1)
Sodium: 138 mmol/L (ref 135–145)

## 2021-06-15 LAB — RESP PANEL BY RT-PCR (FLU A&B, COVID) ARPGX2
Influenza A by PCR: NEGATIVE
Influenza B by PCR: NEGATIVE
SARS Coronavirus 2 by RT PCR: NEGATIVE

## 2021-06-15 LAB — CBC
HCT: 41.9 % (ref 39.0–52.0)
Hemoglobin: 15 g/dL (ref 13.0–17.0)
MCH: 33.1 pg (ref 26.0–34.0)
MCHC: 35.8 g/dL (ref 30.0–36.0)
MCV: 92.5 fL (ref 80.0–100.0)
Platelets: 209 10*3/uL (ref 150–400)
RBC: 4.53 MIL/uL (ref 4.22–5.81)
RDW: 14.2 % (ref 11.5–15.5)
WBC: 9.4 10*3/uL (ref 4.0–10.5)
nRBC: 0 % (ref 0.0–0.2)

## 2021-06-15 LAB — TROPONIN I (HIGH SENSITIVITY)
Troponin I (High Sensitivity): 66 ng/L — ABNORMAL HIGH (ref <18)
Troponin I (High Sensitivity): 63 ng/L — ABNORMAL HIGH (ref <18)

## 2021-06-15 MED ORDER — HYDROCHLOROTHIAZIDE 25 MG PO TABS
25.0000 mg | ORAL_TABLET | Freq: Once | ORAL | Status: AC
  Administered 2021-06-15: 25 mg via ORAL
  Filled 2021-06-15: qty 1

## 2021-06-15 MED ORDER — INFLUENZA VAC A&B SA ADJ QUAD 0.5 ML IM PRSY
0.5000 mL | PREFILLED_SYRINGE | INTRAMUSCULAR | Status: AC
  Administered 2021-06-16: 0.5 mL via INTRAMUSCULAR
  Filled 2021-06-15: qty 0.5

## 2021-06-15 MED ORDER — LOSARTAN POTASSIUM-HCTZ 100-25 MG PO TABS: 1.0000 | ORAL_TABLET | Freq: Every day | ORAL | Status: DC

## 2021-06-15 MED ORDER — LOSARTAN POTASSIUM 50 MG PO TABS
100.0000 mg | ORAL_TABLET | Freq: Every day | ORAL | Status: DC
  Administered 2021-06-16: 100 mg via ORAL
  Filled 2021-06-15: qty 2

## 2021-06-15 MED ORDER — BUSPIRONE HCL 15 MG PO TABS
15.0000 mg | ORAL_TABLET | Freq: Two times a day (BID) | ORAL | Status: DC
  Administered 2021-06-15 – 2021-06-16 (×2): 15 mg via ORAL
  Filled 2021-06-15 (×3): qty 1

## 2021-06-15 MED ORDER — MORPHINE SULFATE (PF) 2 MG/ML IV SOLN
2.0000 mg | INTRAVENOUS | Status: DC | PRN
  Administered 2021-06-15 (×3): 2 mg via INTRAVENOUS
  Filled 2021-06-15 (×3): qty 1

## 2021-06-15 MED ORDER — ONDANSETRON HCL 4 MG/2ML IJ SOLN: 4.0000 mg | Freq: Four times a day (QID) | INTRAMUSCULAR | Status: DC | PRN

## 2021-06-15 MED ORDER — ATORVASTATIN CALCIUM 20 MG PO TABS
40.0000 mg | ORAL_TABLET | Freq: Every day | ORAL | Status: DC
  Administered 2021-06-16: 40 mg via ORAL
  Filled 2021-06-15: qty 2

## 2021-06-15 MED ORDER — LOSARTAN POTASSIUM 50 MG PO TABS
100.0000 mg | ORAL_TABLET | Freq: Once | ORAL | Status: AC
  Administered 2021-06-15: 100 mg via ORAL
  Filled 2021-06-15: qty 2

## 2021-06-15 MED ORDER — CLONIDINE HCL 0.1 MG PO TABS
0.3000 mg | ORAL_TABLET | Freq: Two times a day (BID) | ORAL | Status: DC
  Administered 2021-06-15 – 2021-06-16 (×2): 0.3 mg via ORAL
  Filled 2021-06-15 (×2): qty 3

## 2021-06-15 MED ORDER — ONDANSETRON HCL 4 MG PO TABS: 4.0000 mg | ORAL_TABLET | Freq: Four times a day (QID) | ORAL | Status: DC | PRN

## 2021-06-15 MED ORDER — HYDRALAZINE HCL 20 MG/ML IJ SOLN
10.0000 mg | Freq: Once | INTRAMUSCULAR | Status: AC
  Administered 2021-06-15: 10 mg via INTRAVENOUS
  Filled 2021-06-15: qty 1

## 2021-06-15 MED ORDER — POLYETHYLENE GLYCOL 3350 17 G PO PACK: 17.0000 g | PACK | Freq: Every day | ORAL | Status: DC | PRN

## 2021-06-15 MED ORDER — POTASSIUM CHLORIDE CRYS ER 10 MEQ PO TBCR
10.0000 meq | EXTENDED_RELEASE_TABLET | Freq: Every day | ORAL | Status: DC
  Administered 2021-06-15 – 2021-06-16 (×2): 10 meq via ORAL
  Filled 2021-06-15 (×2): qty 1

## 2021-06-15 MED ORDER — METOPROLOL TARTRATE 25 MG PO TABS
25.0000 mg | ORAL_TABLET | Freq: Two times a day (BID) | ORAL | Status: DC
  Administered 2021-06-15 – 2021-06-16 (×2): 25 mg via ORAL
  Filled 2021-06-15 (×2): qty 1

## 2021-06-15 MED ORDER — ACETAMINOPHEN 650 MG RE SUPP
650.0000 mg | Freq: Four times a day (QID) | RECTAL | Status: DC | PRN
  Filled 2021-06-15: qty 1

## 2021-06-15 MED ORDER — HYDROCODONE-ACETAMINOPHEN 5-325 MG PO TABS
1.0000 | ORAL_TABLET | ORAL | Status: DC | PRN
  Administered 2021-06-15 (×2): 2 via ORAL
  Filled 2021-06-15 (×2): qty 2

## 2021-06-15 MED ORDER — GABAPENTIN 300 MG PO CAPS
300.0000 mg | ORAL_CAPSULE | Freq: Three times a day (TID) | ORAL | Status: DC
  Administered 2021-06-15 – 2021-06-16 (×3): 300 mg via ORAL
  Filled 2021-06-15 (×3): qty 1

## 2021-06-15 MED ORDER — HEPARIN SODIUM (PORCINE) 5000 UNIT/ML IJ SOLN
5000.0000 [IU] | Freq: Three times a day (TID) | INTRAMUSCULAR | Status: DC
  Administered 2021-06-15 – 2021-06-16 (×3): 5000 [IU] via SUBCUTANEOUS
  Filled 2021-06-15 (×3): qty 1

## 2021-06-15 MED ORDER — ALLOPURINOL 300 MG PO TABS
150.0000 mg | ORAL_TABLET | Freq: Every day | ORAL | Status: DC
  Administered 2021-06-16: 150 mg via ORAL
  Filled 2021-06-15: qty 0.5

## 2021-06-15 MED ORDER — FUROSEMIDE 10 MG/ML IJ SOLN
20.0000 mg | Freq: Once | INTRAMUSCULAR | Status: AC
  Administered 2021-06-15: 20 mg via INTRAVENOUS
  Filled 2021-06-15: qty 4

## 2021-06-15 MED ORDER — AMLODIPINE BESYLATE 5 MG PO TABS: 5.0000 mg | ORAL_TABLET | Freq: Every day | ORAL | Status: DC

## 2021-06-15 MED ORDER — HYDROCHLOROTHIAZIDE 25 MG PO TABS
25.0000 mg | ORAL_TABLET | Freq: Every day | ORAL | Status: DC
  Administered 2021-06-16: 25 mg via ORAL
  Filled 2021-06-15: qty 1

## 2021-06-15 MED ORDER — ACETAMINOPHEN 325 MG PO TABS: 650.0000 mg | ORAL_TABLET | Freq: Four times a day (QID) | ORAL | Status: DC | PRN

## 2021-06-15 MED ORDER — HYDRALAZINE HCL 20 MG/ML IJ SOLN: 10.0000 mg | INTRAMUSCULAR | Status: DC | PRN

## 2021-06-15 MED ORDER — CLONIDINE HCL 0.1 MG PO TABS
0.3000 mg | ORAL_TABLET | Freq: Once | ORAL | Status: AC
  Administered 2021-06-15: 0.3 mg via ORAL
  Filled 2021-06-15: qty 3

## 2021-06-15 MED ORDER — PERFLUTREN LIPID MICROSPHERE
1.0000 mL | INTRAVENOUS | Status: AC | PRN
  Administered 2021-06-15: 6 mL via INTRAVENOUS
  Filled 2021-06-15: qty 10

## 2021-06-15 MED ORDER — OXYBUTYNIN CHLORIDE ER 5 MG PO TB24
5.0000 mg | ORAL_TABLET | Freq: Every day | ORAL | Status: DC
  Administered 2021-06-15 – 2021-06-16 (×2): 5 mg via ORAL
  Filled 2021-06-15 (×2): qty 1

## 2021-06-15 MED ORDER — POTASSIUM CHLORIDE CRYS ER 20 MEQ PO TBCR
40.0000 meq | EXTENDED_RELEASE_TABLET | Freq: Once | ORAL | Status: AC
  Administered 2021-06-15: 40 meq via ORAL
  Filled 2021-06-15: qty 2

## 2021-06-15 NOTE — ED Provider Notes (Signed)
[facility] Emergency Department Provider Note ____________________________________________   Event Date/Time   First MD Initiated Contact with Patient 06/15/21 0701     (approximate)  I have reviewed the triage vital signs and the nursing notes.   HISTORY  Chief Complaint Chest Pain and Hypertension    HPI Steve Buck. is a 65 y.o. male with PMH as noted below including CHF, CKD, diabetes, and hypertension who presents with elevated blood pressures at home over the last several days as high as the 540J systolic and 811B diastolic.  The patient states he has been taking all of his blood pressure medications as prescribed with no improvement.  He reports some associated chest pain, relatively mild intensity and intermittent, once yesterday and again this morning.  He denies associated shortness of breath or lightheadedness.     Past Medical History:  Diagnosis Date   (HFpEF) heart failure with preserved ejection fraction ([place])    a. 12/2017 Echo: EF 60-65%, no rwma, Gr1DD, mild MR, mildly dil LA, nl RV fxn.   Allergy    Seasonal   Chest pain    a. 12/2017 Lexiscan MV: EF 37% (nl by echo). Small mild, fixed region of apical thinning, likely attenuation artifact. No ischemia. Low risk.   CKD (chronic kidney disease) stage 3, GFR 30-59 ml/min (HCC) 03/30/2018   Diabetic nephropathy associated with type 2 diabetes mellitus ([place]) 03/25/2018   DKA (diabetic ketoacidoses) 12/01/2017   Heart murmur    a. 12/2017 Echo: Mild MR.   History of Bell's palsy 03/30/2018   Hypertension    Morbid obesity ([place])    Palpitations    PVC's (premature ventricular contractions)    a. 05/2018 Holter: RSR, avg rate of 60. Freq PVC's (6.4% burden). Some ventricular bigeminy/trigeminy.   Type 2 diabetes mellitus, uncontrolled, with neuropathy     Patient Active Problem List   Diagnosis Date Noted   Hypertensive emergency 06/15/2021   Gout flare 03/10/2019   Cyst of left  knee joint 03/10/2019   Morbid obesity ([place]) 08/05/2018   Bradycardia 08/05/2018   PVC's (premature ventricular contractions) 08/05/2018   Atrial fibrillation ([place]) 07/08/2018   History of Bell's palsy 03/30/2018   CKD (chronic kidney disease) stage 3, GFR 30-59 ml/min ([place]) 03/30/2018   Diabetic nephropathy associated with type 2 diabetes mellitus ([place]) 03/25/2018   Type 2 diabetes mellitus, uncontrolled, with neuropathy    Hypertension associated with diabetes ([place]) 01/08/2016   Nocturia 01/08/2016   Neuropathy 01/31/2014   Leg cramps 03/03/2013   Gout 11/25/2012    Past Surgical History:  Procedure Laterality Date   colonoscvopy  2015   FOOT SURGERY  around [place] 07/08/2018   Procedure: LOOP RECORDER INSERTION;  Surgeon: Deboraha Sprang, MD;  Location: [place] [facility];  Service: Cardiovascular;  Laterality: N/A;    Prior to Admission medications   Medication Sig Start Date End Date Taking? Authorizing Provider  allopurinol (ZYLOPRIM) 300 MG tablet Take 0.5 tablets (150 mg total) by mouth daily. 03/17/19   Tukov-Yual, Arlyss Gandy, NP  amLODipine (NORVASC) 10 MG tablet TAKE 1 TABLET BY MOUTH EVERY DAY 03/16/20   Deboraha Sprang, MD  atorvastatin (LIPITOR) 10 MG tablet Take 1 tablet (10 mg total) by mouth daily. 02/03/19   Tukov-Yual, Arlyss Gandy, NP  cloNIDine (CATAPRES) 0.3 MG tablet Take 1 tablet (0.3 mg total) by mouth 2 (two) times daily. 02/03/19   Tukov-Yual, Arlyss Gandy, NP  colchicine  0.6 MG tablet Take 2 tablets (1.2 mg total) by mouth daily. Take 1.2mg (2 tablets) po x1 and then 0.6 mg 1 hr later x1 Patient taking differently: Take 0.6 mg by mouth as needed.  03/31/19   Tukov-Yual, Arlyss Gandy, NP  diclofenac sodium (VOLTAREN) 1 % GEL Apply 2 g topically 4 (four) times daily. 03/18/19   Tukov-Yual, Arlyss Gandy, NP  escitalopram (LEXAPRO) 10 MG tablet Take 10 mg by mouth daily. 07/14/19   [provider]  gabapentin (NEURONTIN) 100 MG  capsule Take 2 capsules (200 mg total) by mouth 2 (two) times daily. 02/03/19   Tukov-Yual, Arlyss Gandy, NP  gabapentin (NEURONTIN) 300 MG capsule Take 1 capsule (300 mg total) by mouth at bedtime. 02/03/19   Tukov-Yual, Magdalene S, NP  glucose blood test strip 1 each by Other route 3 (three) times daily. Use as instructed 02/03/19   Tukov-Yual, Arlyss Gandy, NP  hydrochlorothiazide (HYDRODIURIL) 25 MG tablet Take 1 tablet (25 mg total) by mouth daily. 02/03/19   Tukov-Yual, Arlyss Gandy, NP  Lancets MISC 1 each by Does not apply route 3 (three) times daily. 02/03/19   Tukov-Yual, Arlyss Gandy, NP  losartan (COZAAR) 50 MG tablet Take 1 tablet (50 mg total) by mouth at bedtime. 02/03/19   Tukov-Yual, Arlyss Gandy, NP  metFORMIN (GLUCOPHAGE) 500 MG tablet Take 1 tablet (500 mg total) by mouth 2 (two) times daily with a meal. 02/03/19   Tukov-Yual, Arlyss Gandy, NP  metoprolol tartrate (LOPRESSOR) 25 MG tablet TAKE 1 TABLET BY MOUTH TWICE A DAY 06/08/20   Deboraha Sprang, MD  polyethylene glycol powder (GLYCOLAX/MIRALAX) 17 GM/SCOOP powder Take 17 g by mouth as needed. 02/03/19   Tukov-Yual, Arlyss Gandy, NP  potassium chloride (KLOR-CON) 10 MEQ tablet Take 10 mEq by mouth daily. 07/19/19   [provider]  psyllium (METAMUCIL) 58.6 % powder Take 1 packet by mouth daily. 02/03/19   Tukov-Yual, Arlyss Gandy, NP    Allergies Lisinopril  Family History  Problem Relation Age of Onset   Cancer Mother        Skin   CAD Father    Arthritis Father     Social History Social History   Tobacco Use   Smoking status: Never   Smokeless tobacco: Never  Vaping Use   Vaping Use: Never used  Substance Use Topics   Alcohol use: Not Currently   Drug use: Yes    Types: Marijuana    Comment: occasionally    Review of Systems  Constitutional: No fever/chills Eyes: No visual changes. ENT: No sore throat. Cardiovascular: Positive for intermittent chest pain. Respiratory: Denies shortness of  breath. Gastrointestinal: No nausea, no vomiting.  No diarrhea.  Genitourinary: Negative for dysuria.  Musculoskeletal: Negative for back pain. Skin: Negative for rash. Neurological: Negative for headaches, focal weakness or numbness.   ____________________________________________   PHYSICAL EXAM:  VITAL SIGNS: ED Triage Vitals  Enc Vitals Group     BP 06/15/21 0507 (!) 195/126     Pulse Rate 06/15/21 0507 69     Resp 06/15/21 0507 20     Temp 06/15/21 0507 98 F (36.7 C)     Temp Source 06/15/21 0507 Oral     SpO2 06/15/21 0507 98 %     Weight 06/15/21 0507 279 lb (126.6 kg)     Height 06/15/21 0507 6\' 1"  (1.854 m)     Head Circumference --      Peak Flow --      Pain Score  06/15/21 0517 3     Pain Loc --      Pain Edu? --      Excl. in [place]? --     Constitutional: Alert and oriented. Well appearing and in no acute distress. Eyes: Conjunctivae are normal.  Head: Atraumatic. Nose: No congestion/rhinnorhea. Mouth/Throat: Mucous membranes are moist.   Neck: Normal range of motion.  Cardiovascular: Normal rate, regular rhythm. Grossly normal heart sounds.  Good peripheral circulation. Respiratory: Normal respiratory effort.  No retractions. Lungs CTAB. Gastrointestinal: No distention.  Musculoskeletal: No lower extremity edema.  Extremities warm and well perfused.  Neurologic:  Normal speech and language. No gross focal neurologic deficits are appreciated.  Skin:  Skin is warm and dry. No rash noted. Psychiatric: Mood and affect are normal. Speech and behavior are normal.  ____________________________________________   LABS (all labs ordered are listed, but only abnormal results are displayed)  Labs Reviewed  BASIC METABOLIC PANEL - Abnormal; Notable for the following components:      Result Value   Potassium 3.0 (*)    Glucose, Bld 120 (*)    BUN 35 (*)    Creatinine, Ser 1.70 (*)    GFR, Estimated 44 (*)    All other components within normal limits  TROPONIN  I (HIGH SENSITIVITY) - Abnormal; Notable for the following components:   Troponin I (High Sensitivity) 66 (*)    All other components within normal limits  TROPONIN I (HIGH SENSITIVITY) - Abnormal; Notable for the following components:   Troponin I (High Sensitivity) 63 (*)    All other components within normal limits  RESP PANEL BY RT-PCR (FLU A&B, COVID) ARPGX2  CBC   ____________________________________________  EKG  ED ECG REPORT I, Arta Silence, the attending physician, personally viewed and interpreted this ECG.  Date: 06/15/2021 EKG Time: 0523 Rate: 60 Rhythm: normal sinus rhythm QRS Axis: normal Intervals: normal ST/T Wave abnormalities: nonspecific T wave abnormality Narrative Interpretation: no evidence of acute ischemia   ____________________________________________  RADIOLOGY  Chest X-ray interpreted by me shows no focal consolidation or edema  ____________________________________________   PROCEDURES  Procedure(s) performed: No  Procedures  Critical Care performed: No ____________________________________________   INITIAL IMPRESSION / ASSESSMENT AND PLAN / ED COURSE  Pertinent labs & imaging results that were available during my care of the patient were reviewed by me and considered in my medical decision making (see chart for details).   65 year old male with PMH as noted above including CHF, CKD, diabetes, and hypertension who presents with significantly elevated blood pressures despite being compliant with his antihypertensives as well as intermittent chest pain.   I reviewed the past medical records in [place]; the patient is currently on clonidine, losartan-hctz, metoprolol for his blood pressure.  He has no recent ED visits or admissions.   On exam he is overall well appearing; his blood pressure is elevated but vital signs are otherwise normal.  Physical exam is unremarkable.  EKG demonstrates nonspecific T wave  abnormalities.  Initial lab workup obtained from triage reveals elevated troponin; given the chest pain this could be related to ACS versus being elevated due to the hypertension or CKD.  We will give IV hydralazine here, repeat troponin and reassess.   ----------------------------------------- 9:27 AM on 06/15/2021 -----------------------------------------  Repeat troponin has not significantly changed.  Blood pressure briefly improved with hydralazine but then went back up to as high as 161 systolic and 096 diastolic.  I have given a second dose of  hydralazine and the patient's home medications.  I consulted Dr. Rowe Pavy from the hospitalist service for admission.  ____________________________________________   FINAL CLINICAL IMPRESSION(S) / ED DIAGNOSES  Final diagnoses:  Nonspecific chest pain  Hypertension, unspecified type      NEW MEDICATIONS STARTED DURING THIS VISIT:  New Prescriptions   No medications on file     Note:  This document was prepared using Dragon voice recognition software and may include unintentional dictation errors.    Arta Silence, MD 06/15/21 [phone]

## 2021-06-15 NOTE — ED Notes (Signed)
Pt assisted to unit with [place] [facility] via wheelchair.

## 2021-06-15 NOTE — ED Notes (Signed)
Pt c/o reoccurence of his intermittent chest pain from earlier. Reports mild pain around left side of chest that lasts for approx. 30 seconds at a time. Pt also received Morphine for his headache at 10:54 am. Will re-evaluate pain.

## 2021-06-15 NOTE — ED Notes (Signed)
Dr. Rowe Pavy notified of pt needing change in level of care.

## 2021-06-15 NOTE — ED Notes (Signed)
Pt reports headache improved after pain medication to a 5 out of 10 from 8 out of 10.

## 2021-06-15 NOTE — ED Notes (Signed)
Pt ordered norvasc, but endorsing that they were taken off of it a while ago. MD aware , awaiting orders

## 2021-06-15 NOTE — ED Notes (Signed)
Gregary Signs RN aware of assigned bed

## 2021-06-15 NOTE — ED Notes (Addendum)
Pt resting comfortably in bed. Denies chest pain at this time. No headache, dizziness or lightheadedness reported. No SOB reported.

## 2021-06-15 NOTE — ED Notes (Signed)
PT endorsing increased swelling biltaerally in hands with a slight increase of work of  breathing. PT alert with o2 sat in 90s on RA. MD alerted via secure messaging

## 2021-06-15 NOTE — ED Triage Notes (Signed)
Pt presents to ER c/o chest pain and hypertension.  Pt states chest pain has been going on and off for several months, and BP has been elevated for some time as well.  Pt states he takes several BP meds and has not missed any doses.  Pt states chest pain is intermittent in nature.  Pt denies seeing his pcp for this persistent elevated BP.

## 2021-06-15 NOTE — ED Notes (Signed)
Melissa RN aware of assigned bed

## 2021-06-15 NOTE — H&P (Addendum)
History and Physical:    Steve Buck.   JKK:[id number] DOB: Jan 12, 1956 DOA: 06/15/2021  PCP: Kirk Ruths, MD  Chief Complaint: Chest pain, elevated blood pressure   History of Present Illness:    HPI: Steve Buck. is a 65 y.o. male with a past medical history of CHF with preserved ejection fraction, hypertension, chronic kidney disease stage III, diet-controlled diabetes mellitus with diabetic neuropathy, dyslipidemia, gout, anxiety.  This patient presents to the emergency department due to elevated blood pressures at home.  States for the past few weeks his systolic blood pressure has been in the 190s.  Yesterday and today his systolic pressure was in the low 200s.  He has also been having intermittent chest pain predominantly for the past 2 days.  Somewhat of a poor historian because he also says its been going on for the past few weeks.  Nonreproducible.  Describes as achy.  No radiation.  Occurs at rest as well.  Has a history of cardiac stress testing.  No history of cardiac catheterization.  States he has been compliant with his blood pressure medications.  Wife is present at bedside.  No shortness of breath, no syncope, presyncope.  No orthopnea, paroxysmal nocturnal dyspnea.  States he would like to switch his cardiologist.  Not having chest pain right now.   ED Course: The patient was given clonidine 0.3 mg p.o., Cozaar 100 mg and hydrochlorothiazide 25 mg for his hypertensive emergency.  He was also given 2 pushes of hydralazine 10 mg IV.  His systolic blood pressure has improved to 130s to 140s upon my examination at bedside.  His troponins are elevated at 66 initially and then repeat troponin is flattened to 63.  EKG shows sinus rhythm with first-degree AV block at 60 bpm.  No significant lab abnormalities.  Creatinine is at baseline and his potassium is mildly low at 3.0.   ROS:   14 point review of systems is negative except for what is mentioned  above in the HPI.   Past Medical History:   Past Medical History:  Diagnosis Date   (HFpEF) heart failure with preserved ejection fraction ([place])    a. 12/2017 Echo: EF 60-65%, no rwma, Gr1DD, mild MR, mildly dil LA, nl RV fxn.   Allergy    Seasonal   Chest pain    a. 12/2017 Lexiscan MV: EF 37% (nl by echo). Small mild, fixed region of apical thinning, likely attenuation artifact. No ischemia. Low risk.   CKD (chronic kidney disease) stage 3, GFR 30-59 ml/min (HCC) 03/30/2018   Diabetic nephropathy associated with type 2 diabetes mellitus ([place]) 03/25/2018   DKA (diabetic ketoacidoses) 12/01/2017   Heart murmur    a. 12/2017 Echo: Mild MR.   History of Bell's palsy 03/30/2018   Hypertension    Morbid obesity ([place])    Palpitations    PVC's (premature ventricular contractions)    a. 05/2018 Holter: RSR, avg rate of 60. Freq PVC's (6.4% burden). Some ventricular bigeminy/trigeminy.   Type 2 diabetes mellitus, uncontrolled, with neuropathy     Past Surgical History:   Past Surgical History:  Procedure Laterality Date   colonoscvopy  2015   FOOT SURGERY  around [place] N/A 07/08/2018   Procedure: LOOP RECORDER INSERTION;  Surgeon: Deboraha Sprang, MD;  Location: [place] [facility];  Service: Cardiovascular;  Laterality: N/A;    Social History:   Social History   Socioeconomic History  Marital status: Married    Spouse name: Not on file   Number of children: 3   Years of education: Not on file   Highest education level: Not on file  Occupational History   Occupation: retired    Comment: Delivery driver-produce  Tobacco Use   Smoking status: Never   Smokeless tobacco: Never  Vaping Use   Vaping Use: Never used  Substance and Sexual Activity   Alcohol use: Not Currently   Drug use: Yes    Types: Marijuana    Comment: occasionally   Sexual activity: Not on file  Other Topics Concern   Not on file  Social History Narrative   Pt said that  everything was going well.    Said the main stress for him is paying for medical care, but things are going well here.   No alcohol use noted.  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   Social Determinants of Health   Financial Resource Strain: Not on file  Food Insecurity: Not on file  Transportation Needs: Not on file  Physical Activity: Not on file  Stress: Not on file  Social Connections: Not on file  Intimate Partner Violence: Not on file    Allergies:   Allergies  Allergen Reactions   Lisinopril Rash and Cough    Family History:   Family History  Problem Relation Age of Onset   Cancer Mother        Skin   CAD Father    Arthritis Father      Current Medications:   Prior to Admission medications   Medication Sig Start Date End Date Taking? Authorizing Provider  allopurinol (ZYLOPRIM) 300 MG tablet Take 0.5 tablets (150 mg total) by mouth daily. 03/17/19  Yes Tukov-Yual, Magdalene S, NP  atorvastatin (LIPITOR) 40 MG tablet Take 40 mg by mouth daily. 04/19/21  Yes [provider]  busPIRone (BUSPAR) 15 MG tablet Take 15 mg by mouth 2 (two) times daily. 05/25/21  Yes [provider]  cloNIDine (CATAPRES) 0.3 MG tablet Take 1 tablet (0.3 mg total) by mouth 2 (two) times daily. 02/03/19  Yes Tukov-Yual, Magdalene S, NP  gabapentin (NEURONTIN) 300 MG capsule Take 1 capsule (300 mg total) by mouth at bedtime. Patient taking differently: Take 300 mg by mouth 3 (three) times daily. 02/03/19  Yes Tukov-Yual, Magdalene S, NP  losartan-hydrochlorothiazide (HYZAAR) 100-25 MG tablet Take 1 tablet by mouth daily. 04/19/21  Yes [provider]  metoprolol tartrate (LOPRESSOR) 25 MG tablet TAKE 1 TABLET BY MOUTH TWICE A DAY 06/08/20  Yes Deboraha Sprang, MD  oxybutynin (DITROPAN-XL) 5 MG 24 hr tablet Take 5 mg by mouth daily. 04/19/21  Yes [provider]  potassium chloride (KLOR-CON) 10 MEQ tablet Take 10 mEq by mouth daily. 07/19/19  Yes  [provider]  colchicine 0.6 MG tablet Take 2 tablets (1.2 mg total) by mouth daily. Take 1.2mg (2 tablets) po x1 and then 0.6 mg 1 hr later x1 Patient taking differently: Take 0.6 mg by mouth as needed. 03/31/19   Tukov-Yual, Arlyss Gandy, NP  diclofenac sodium (VOLTAREN) 1 % GEL Apply 2 g topically 4 (four) times daily. 03/18/19   Tukov-Yual, Magdalene S, NP  glucose blood test strip 1 each by Other route 3 (three) times daily. Use as instructed 02/03/19   Tukov-Yual, Arlyss Gandy, NP  polyethylene glycol powder (GLYCOLAX/MIRALAX) 17 GM/SCOOP powder Take 17 g by mouth as needed. 02/03/19   Erlene Quan, NP  psyllium (METAMUCIL) 58.6 % powder Take 1 packet by mouth daily. 02/03/19   Erlene Quan, NP     Physical Exam:   Vitals:   06/15/21 0930 06/15/21 0945 06/15/21 1100 06/15/21 1115  BP: (!) 155/92 (!) 191/103 139/83 126/78  Pulse: 77 (!) 49 81 77  Resp: 18  14 16   Temp:      TempSrc:      SpO2: 98% 97% 96% 96%  Weight:      Height:         General:  Appears calm and comfortable and is in NAD Cardiovascular:  RRR, no m/r/g.  Nonreproducible chest pain. Respiratory:   CTA bilaterally with no wheezes/rales/rhonchi.  Normal respiratory effort. Abdomen:  soft, NT, ND, NABS Skin:  no rash or induration seen on limited exam Musculoskeletal:  grossly normal tone BUE/BLE, good ROM, no bony abnormality Lower extremity:  No LE edema.  Limited foot exam with no ulcerations.  2+ distal pulses. Psychiatric:  grossly normal mood and affect, speech fluent and appropriate, AOx3 Neurologic:  CN 2-12 grossly intact, moves all extremities in coordinated fashion, sensation intact    Data Review:    Radiological Exams on Admission: Independently reviewed - see discussion in A/P where applicable  DG Chest 2 View  Result Date: 06/15/2021 CLINICAL DATA:  Chest pain. EXAM: CHEST - 2 VIEW COMPARISON:  December 01, 2017 FINDINGS: A loop recorder device is identified. The  heart is borderline in size. The hila and mediastinum are normal. No pneumothorax. No nodules or masses. No focal infiltrate. No edema. IMPRESSION: No active cardiopulmonary disease. Electronically Signed   By: Dorise Bullion III M.D.   On: 06/15/2021 05:45    EKG: Independently reviewed.  Sinus rhythm with first-degree AV block, 60 bpm   Labs on Admission: I have personally reviewed the available labs and imaging studies at the time of the admission.  Pertinent labs on Admission: Trop 66 initially, repeat troponin 63, potassium 3.0, creatinine 1.70, blood glucose 120     Assessment/Plan:   Hypertensive emergency: Continue home clonidine 0.3 mg twice daily, Hyzaar 100/25 mg daily, Lopressor 25 mg twice daily.  Start amlodipine 5 mg daily.  Start hydralazine 10 mg IV every 4 hours as needed for systolic blood pressure greater than 180.  Titrate blood pressure medications as needed.  Continue home Klor-Con daily.  Med rec shows additional hydrochlorothiazide 25 mg daily on top of his Hyzaar.  Unclear if he is taking this.  Would not continue this given his chronic kidney disease.  Chest pain: Likely secondary to hypertensive emergency.  Cardiology will be consulted.  Consider cardiac stress testing.  EKG shows no evidence of acute ischemia.  Troponins mildly elevated and now flattened.  Obtain echocardiogram to look for wall motion abnormalities.  Elevated troponins: Likely demand ischemia from hypertensive emergency.  Plan as above  Hypokalemia: Initial serum potassium 3.0.  KCl p.o. 40 MEQ x1.  Continue home Klor-Con 10 MEQ daily.  Hypertension: Uncontrolled.  Plan as above  Dyslipidemia: Continue home atorvastatin  Anxiety: Continue home BuSpar  Diabetes mellitus: Previously on metformin.  Diet-controlled.  Obtain A1c.  Gout: Not in acute flare.  Continue home allopurinol  CHF with preserved ejection fraction: Appears euvolemic.  Not in acute decompensation.  Chronic kidney  disease stage III: Appears at baseline  Diabetic neuropathy: Continue home gabapentin  Obesity: BMI 36.  Counseled on lifestyle modifications   Other information:    Level of Care: Cardiac progressive care DVT prophylaxis:  Heparin subcu Code Status: Full code Consults: Cardiology Admission status: Observation   Leslee Home DO [facility]   How to contact the [facility] Attending or Consulting provider [place] or covering provider during after hours [place], for this patient?  Check the care team in [facility] and look for a) attending/consulting TR[facility] provider listed and b) the [facility] team listed Log into [email or web address] and use [place]'s universal password to access. If you do not have the password, please contact the hospital operator. Locate the [facility] provider you are looking for under [facility] and page to a number that you can be directly reached. If you still have difficulty reaching the provider, please page the [facility] (Director on Call) for the Hospitalists listed on amion for assistance.   06/15/2021, 12:32 PM

## 2021-06-15 NOTE — Progress Notes (Signed)
*  PRELIMINARY RESULTS* Echocardiogram 2D Echocardiogram has been performed. Definity IV Contrast used on this study.  Claretta Fraise 06/15/2021, 3:16 PM

## 2021-06-16 DIAGNOSIS — I160 Hypertensive urgency: Secondary | ICD-10-CM

## 2021-06-16 DIAGNOSIS — I200 Unstable angina: Secondary | ICD-10-CM | POA: Diagnosis not present

## 2021-06-16 LAB — BASIC METABOLIC PANEL
Anion gap: 12 (ref 5–15)
BUN: 40 mg/dL — ABNORMAL HIGH (ref 8–23)
CO2: 29 mmol/L (ref 22–32)
Calcium: 9.7 mg/dL (ref 8.9–10.3)
Chloride: 99 mmol/L (ref 98–111)
Creatinine, Ser: 1.74 mg/dL — ABNORMAL HIGH (ref 0.61–1.24)
GFR, Estimated: 42 mL/min — ABNORMAL LOW (ref >60)
Glucose, Bld: 109 mg/dL — ABNORMAL HIGH (ref 70–99)
Potassium: 2.9 mmol/L — ABNORMAL LOW (ref 3.5–5.1)
Sodium: 140 mmol/L (ref 135–145)

## 2021-06-16 LAB — CBC
HCT: 44.4 % (ref 39.0–52.0)
Hemoglobin: 15.3 g/dL (ref 13.0–17.0)
MCH: 31.8 pg (ref 26.0–34.0)
MCHC: 34.5 g/dL (ref 30.0–36.0)
MCV: 92.3 fL (ref 80.0–100.0)
Platelets: 221 10*3/uL (ref 150–400)
RBC: 4.81 MIL/uL (ref 4.22–5.81)
RDW: 14.4 % (ref 11.5–15.5)
WBC: 8 10*3/uL (ref 4.0–10.5)
nRBC: 0 % (ref 0.0–0.2)

## 2021-06-16 LAB — ECHOCARDIOGRAM COMPLETE
AR max vel: 3.12 cm2
AV Peak grad: 5.7 mmHg
Ao pk vel: 1.19 m/s
Area-P 1/2: 4.93 cm2
Height: 73 in
Weight: 4464 oz

## 2021-06-16 LAB — HIV ANTIBODY (ROUTINE TESTING W REFLEX): HIV Screen 4th Generation wRfx: NONREACTIVE

## 2021-06-16 LAB — GLUCOSE, CAPILLARY: Glucose-Capillary: 113 mg/dL — ABNORMAL HIGH (ref 70–99)

## 2021-06-16 LAB — TSH: TSH: 3.138 u[IU]/mL (ref 0.350–4.500)

## 2021-06-16 MED ORDER — LOSARTAN POTASSIUM-HCTZ 100-25 MG PO TABS: 1.0000 | ORAL_TABLET | Freq: Every day | ORAL | 0 refills | Status: DC

## 2021-06-16 MED ORDER — GABAPENTIN 300 MG PO CAPS: 300.0000 mg | ORAL_CAPSULE | Freq: Three times a day (TID) | ORAL | Status: AC

## 2021-06-16 MED ORDER — HYDRALAZINE HCL 50 MG PO TABS: 50.0000 mg | ORAL_TABLET | Freq: Three times a day (TID) | ORAL | 0 refills | Status: DC

## 2021-06-16 MED ORDER — POTASSIUM CHLORIDE CRYS ER 20 MEQ PO TBCR
40.0000 meq | EXTENDED_RELEASE_TABLET | ORAL | Status: AC
Start: 2021-06-16 — End: 2021-06-16
  Administered 2021-06-16 (×2): 40 meq via ORAL
  Filled 2021-06-16 (×2): qty 4

## 2021-06-16 MED ORDER — AMLODIPINE BESYLATE 10 MG PO TABS
10.0000 mg | ORAL_TABLET | Freq: Every day | ORAL | Status: DC
  Administered 2021-06-16: 10 mg via ORAL
  Filled 2021-06-16: qty 1

## 2021-06-16 MED ORDER — COLCHICINE 0.6 MG PO TABS: 0.6000 mg | ORAL_TABLET | ORAL | Status: DC | PRN

## 2021-06-16 MED ORDER — AMLODIPINE BESYLATE 10 MG PO TABS: 10.0000 mg | ORAL_TABLET | Freq: Every day | ORAL | 2 refills | Status: DC

## 2021-06-16 MED ORDER — POTASSIUM CHLORIDE CRYS ER 20 MEQ PO TBCR: 40.0000 meq | EXTENDED_RELEASE_TABLET | Freq: Every day | ORAL | 0 refills | Status: DC

## 2021-06-16 MED ORDER — HYDRALAZINE HCL 50 MG PO TABS
50.0000 mg | ORAL_TABLET | Freq: Three times a day (TID) | ORAL | Status: DC
  Administered 2021-06-16: 50 mg via ORAL
  Filled 2021-06-16: qty 1

## 2021-06-16 MED ORDER — INFLUENZA VAC A&B SA ADJ QUAD 0.5 ML IM PRSY: 0.5000 mL | PREFILLED_SYRINGE | Freq: Once | INTRAMUSCULAR | 0 refills | Status: AC

## 2021-06-16 NOTE — Progress Notes (Signed)
Discharge instructions (including medications) discussed with and copy provided to patient/caregiver.    Encouraged patient checking his BP 3 times a day and to write it down in a journal to show the MD.

## 2021-06-16 NOTE — Consult Note (Signed)
[facility] Consultation Note  Patient ID: Steve Browe., MRN: [id number], DOB/AGE: [birth year] 65 y.o. Admit date: 06/15/2021   Date of Consult: 06/16/2021 Primary Physician: Kirk Ruths, MD    Chief Complaint:  Chief Complaint  Patient presents with   Chest Pain   Hypertension   Reason for Consult:  Malignant hypertension  HPI: 65 y.o. male with known HFpEF paroxysmal nonvalvular atrial fibrillation chronic kidney disease stage III with a glomerular filtration rate of 44 diabetes and hyperlipidemia having significant concerns of waxing and waning blood pressure at this time.  The patient did have some weakness and fatigue but no evidence of congestive heart failure type symptoms.  He had some episodes of atypical chest discomfort off and on in the last several days but nothing consistent.  The patient was seen in the emergency room at which time he had EKG showing normal sinus rhythm left axis deviation first-degree AV block with poor R wave progression and heart rate of 49 beats per minutes.  Since then his telemetry has shown heart rates in the 60 bpm range.  Upon arrival he had a glomerular filtration rate of 44 with a troponin of 63/65 most consistent with demand ischemia in And hypertension rather than acute coronary syndrome.  Chest x-ray was normal.  Since hospitalization he has had better blood pressure control with reinstatement of his previous home medication management as well as addition of amlodipine.  Blood pressure more reasonable but slightly high today.  The patient currently has not had any significant symptoms overnight and feels well.  Echocardiogram showing normal LV systolic function and no evidence of significant valvular heart disease  Past Medical History:  Diagnosis Date   (HFpEF) heart failure with preserved ejection fraction ([place])    a. 12/2017 Echo: EF 60-65%, no rwma, Gr1DD, mild MR, mildly dil LA, nl RV fxn.   Allergy    Seasonal    Chest pain    a. 12/2017 Lexiscan MV: EF 37% (nl by echo). Small mild, fixed region of apical thinning, likely attenuation artifact. No ischemia. Low risk.   CKD (chronic kidney disease) stage 3, GFR 30-59 ml/min (HCC) 03/30/2018   Diabetic nephropathy associated with type 2 diabetes mellitus ([place]) 03/25/2018   DKA (diabetic ketoacidoses) 12/01/2017   Heart murmur    a. 12/2017 Echo: Mild MR.   History of Bell's palsy 03/30/2018   Hypertension    Morbid obesity ([place])    Palpitations    PVC's (premature ventricular contractions)    a. 05/2018 Holter: RSR, avg rate of 60. Freq PVC's (6.4% burden). Some ventricular bigeminy/trigeminy.   Type 2 diabetes mellitus, uncontrolled, with neuropathy       Surgical History:  Past Surgical History:  Procedure Laterality Date   colonoscvopy  2015   FOOT SURGERY  around [place] 07/08/2018   Procedure: LOOP RECORDER INSERTION;  Surgeon: Deboraha Sprang, MD;  Location: [facility];  Service: Cardiovascular;  Laterality: N/A;     Home Meds: Prior to Admission medications   Medication Sig Start Date End Date Taking? Authorizing Provider  allopurinol (ZYLOPRIM) 300 MG tablet Take 0.5 tablets (150 mg total) by mouth daily. 03/17/19  Yes Tukov-Yual, Magdalene S, NP  atorvastatin (LIPITOR) 40 MG tablet Take 40 mg by mouth daily. 04/19/21  Yes [provider]  busPIRone (BUSPAR) 15 MG tablet Take 15 mg by mouth 2 (two) times daily. 05/25/21  Yes [provider]  cloNIDine (  CATAPRES) 0.3 MG tablet Take 1 tablet (0.3 mg total) by mouth 2 (two) times daily. 02/03/19  Yes Tukov-Yual, Magdalene S, NP  gabapentin (NEURONTIN) 300 MG capsule Take 1 capsule (300 mg total) by mouth at bedtime. Patient taking differently: Take 300 mg by mouth 3 (three) times daily. 02/03/19  Yes Tukov-Yual, Magdalene S, NP  losartan-hydrochlorothiazide (HYZAAR) 100-25 MG tablet Take 1 tablet by mouth daily. 04/19/21  Yes [provider]   metoprolol tartrate (LOPRESSOR) 25 MG tablet TAKE 1 TABLET BY MOUTH TWICE A DAY 06/08/20  Yes Deboraha Sprang, MD  oxybutynin (DITROPAN-XL) 5 MG 24 hr tablet Take 5 mg by mouth daily. 04/19/21  Yes [provider]  potassium chloride (KLOR-CON) 10 MEQ tablet Take 10 mEq by mouth daily. 07/19/19  Yes [provider]  colchicine 0.6 MG tablet Take 2 tablets (1.2 mg total) by mouth daily. Take 1.2mg (2 tablets) po x1 and then 0.6 mg 1 hr later x1 Patient taking differently: Take 0.6 mg by mouth as needed. 03/31/19   Tukov-Yual, Arlyss Gandy, NP  diclofenac sodium (VOLTAREN) 1 % GEL Apply 2 g topically 4 (four) times daily. 03/18/19   Tukov-Yual, Magdalene S, NP  glucose blood test strip 1 each by Other route 3 (three) times daily. Use as instructed 02/03/19   Tukov-Yual, Arlyss Gandy, NP  polyethylene glycol powder (GLYCOLAX/MIRALAX) 17 GM/SCOOP powder Take 17 g by mouth as needed. 02/03/19   Tukov-Yual, Arlyss Gandy, NP  psyllium (METAMUCIL) 58.6 % powder Take 1 packet by mouth daily. 02/03/19   Erlene Quan, NP    Inpatient Medications:   allopurinol  150 mg Oral Daily   amLODipine  5 mg Oral Daily   atorvastatin  40 mg Oral Daily   busPIRone  15 mg Oral BID   cloNIDine  0.3 mg Oral BID   gabapentin  300 mg Oral TID   heparin  5,000 Units Subcutaneous Q8H   losartan  100 mg Oral Daily   And   hydrochlorothiazide  25 mg Oral Daily   influenza vaccine adjuvanted  0.5 mL Intramuscular Tomorrow-1000   metoprolol tartrate  25 mg Oral BID   oxybutynin  5 mg Oral Daily   potassium chloride  10 mEq Oral Daily     Allergies:  Allergies  Allergen Reactions   Lisinopril Rash and Cough    Social History   Socioeconomic History   Marital status: Married    Spouse name: Not on file   Number of children: 3   Years of education: Not on file   Highest education level: Not on file  Occupational History   Occupation: retired    Comment: Delivery driver-produce  Tobacco Use    Smoking status: Never   Smokeless tobacco: Never  Vaping Use   Vaping Use: Never used  Substance and Sexual Activity   Alcohol use: Not Currently   Drug use: Yes    Types: Marijuana    Comment: occasionally   Sexual activity: Not on file  Other Topics Concern   Not on file  Social History Narrative   Pt said that everything was going well.    Said the main stress for him is paying for medical care, but things are going well here.   No alcohol use noted.  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   Social Determinants of Health   Financial Resource Strain: Not on file  Food Insecurity: Not on file  Transportation Needs: Not on file  Physical  Activity: Not on file  Stress: Not on file  Social Connections: Not on file  Intimate Partner Violence: Not on file     Family History  Problem Relation Age of Onset   Cancer Mother        Skin   CAD Father    Arthritis Father      Review of Systems Positive for shortness of breath Negative for: General:  chills, fever, night sweats or weight changes.  Cardiovascular: PND orthopnea syncope dizziness  Dermatological skin lesions rashes Respiratory: Cough congestion Urologic: Frequent urination urination at night and hematuria Abdominal: negative for nausea, vomiting, diarrhea, bright red blood per rectum, melena, or hematemesis Neurologic: negative for visual changes, and/or hearing changes  All other systems reviewed and are otherwise negative except as noted above.  Labs: No results for input(s): CKTOTAL, CKMB, TROPONINI in the last 72 hours. Lab Results  Component Value Date   WBC 9.4 06/15/2021   HGB 15.0 06/15/2021   HCT 41.9 06/15/2021   MCV 92.5 06/15/2021   PLT 209 06/15/2021    Recent Labs  Lab 06/15/21 0534  NA 138  K 3.0*  CL 101  CO2 27  BUN 35*  CREATININE 1.70*  CALCIUM 9.7  GLUCOSE 120*   Lab Results  Component Value Date   CHOL 158 03/16/2019   HDL 28 (L) 03/16/2019   LDLCALC  75 03/16/2019   TRIG 276 (H) 03/16/2019   No results found for: DDIMER  Radiology/Studies:  DG Chest 2 View  Result Date: 06/15/2021 CLINICAL DATA:  Chest pain. EXAM: CHEST - 2 VIEW COMPARISON:  December 01, 2017 FINDINGS: A loop recorder device is identified. The heart is borderline in size. The hila and mediastinum are normal. No pneumothorax. No nodules or masses. No focal infiltrate. No edema. IMPRESSION: No active cardiopulmonary disease. Electronically Signed   By: Dorise Bullion III M.D.   On: 06/15/2021 05:45   ECHOCARDIOGRAM COMPLETE  Result Date: 06/16/2021    ECHOCARDIOGRAM REPORT   Patient Name:   Steve Buck. Date of Exam: 06/15/2021 Medical Rec #:  [id number]             Height:       73.0 in Accession #:    [id number]            Weight:       279.0 lb Date of Birth:  1956-07-28             BSA:          2.479 m Patient Age:    21 years              BP:           152/115 mmHg Patient Gender: M                     HR:           82 bpm. Exam Location:  [facility] Procedure: 2D Echo and Intracardiac Opacification Agent Indications:     Chest Pain R07.9  History:         Patient has prior history of Echocardiogram examinations, most                  recent 09/15/2019.  Sonographer:     Kathlen Brunswick RDCS Referring Phys:  [place] Diagnosing Phys: Serafina Royals MD  Sonographer Comments: Technically difficult study due to poor echo windows. Image acquisition challenging due to patient body  habitus. IMPRESSIONS  1. Left ventricular ejection fraction, by estimation, is 60 to 65%. The left ventricle has normal function. The left ventricle has no regional wall motion abnormalities. Left ventricular diastolic parameters were normal.  2. Right ventricular systolic function is normal. The right ventricular size is normal.  3. Left atrial size was mildly dilated.  4. The mitral valve is normal in structure. Mild mitral valve regurgitation.  5. The aortic valve is normal in structure.  Aortic valve regurgitation is not visualized. FINDINGS  Left Ventricle: Left ventricular ejection fraction, by estimation, is 60 to 65%. The left ventricle has normal function. The left ventricle has no regional wall motion abnormalities. Definity contrast agent was given IV to delineate the left ventricular  endocardial borders. The left ventricular internal cavity size was normal in size. There is no left ventricular hypertrophy. Left ventricular diastolic parameters were normal. Right Ventricle: The right ventricular size is normal. No increase in right ventricular wall thickness. Right ventricular systolic function is normal. Left Atrium: Left atrial size was mildly dilated. Right Atrium: Right atrial size was normal in size. Pericardium: There is no evidence of pericardial effusion. Mitral Valve: The mitral valve is normal in structure. Mild mitral valve regurgitation. Tricuspid Valve: The tricuspid valve is normal in structure. Tricuspid valve regurgitation is trivial. Aortic Valve: The aortic valve is normal in structure. Aortic valve regurgitation is not visualized. Aortic valve peak gradient measures 5.7 mmHg. Pulmonic Valve: The pulmonic valve was not well visualized. Pulmonic valve regurgitation is not visualized. Aorta: The aortic root and ascending aorta are structurally normal, with no evidence of dilitation. IAS/Shunts: No atrial level shunt detected by color flow Doppler.  LEFT VENTRICLE PLAX 2D LVOT diam:     2.30 cm  Diastology LV SV:         57       LV e' medial:    7.18 cm/s LV SV Index:   23       LV E/e' medial:  5.1 LVOT Area:     4.15 cm LV e' lateral:   9.03 cm/s                         LV E/e' lateral: 4.0  RIGHT VENTRICLE RV Basal diam:  2.80 cm RV S prime:     20.10 cm/s TAPSE (M-mode): 1.9 cm LEFT ATRIUM             Index       RIGHT ATRIUM           Index LA Vol (A2C):   44.4 ml 17.91 ml/m RA Area:     14.90 cm LA Vol (A4C):   82.5 ml 33.28 ml/m RA Volume:   32.40 ml  13.07 ml/m  LA Biplane Vol: 61.5 ml 24.81 ml/m  AORTIC VALVE AV Area (Vmax): 3.12 cm AV Vmax:        119.00 cm/s AV Peak Grad:   5.7 mmHg LVOT Vmax:      89.40 cm/s LVOT Vmean:     61.800 cm/s LVOT VTI:       0.137 m  AORTA Ao Root diam: 3.40 cm MITRAL VALVE MV Area (PHT): 4.93 cm    SHUNTS MV Decel Time: 154 msec    Systemic VTI:  0.14 m MV E velocity: 36.40 cm/s  Systemic Diam: 2.30 cm MV A velocity: 72.40 cm/s MV E/A ratio:  0.50 Serafina Royals MD Electronically signed by Serafina Royals MD Signature Date/Time:  06/16/2021/6:35:24 AM    Final     EKG: Normal sinus rhythm left axis deviation first-degree AV block with poor R wave progression  Weights: Filed Weights   06/15/21 0507 06/15/21 1756  Weight: 126.6 kg 109 kg     Physical Exam: Blood pressure (!) 167/110, pulse (!) 57, temperature 97.9 F (36.6 C), temperature source Oral, resp. rate 19, height 6\' 1"  (1.854 m), weight 109 kg, SpO2 95 %. Body mass index is 31.7 kg/m. General: Well developed, well nourished, in no acute distress. Head eyes ears nose throat: Normocephalic, atraumatic, sclera non-icteric, no xanthomas, nares are without discharge. No apparent thyromegaly and/or mass  Lungs: Normal respiratory effort.  no wheezes, no rales, no rhonchi.  Heart: RRR with normal S1 S2. no murmur gallop, no rub, PMI is normal size and placement, carotid upstroke normal without bruit, jugular venous pressure is normal Abdomen: Soft, non-tender, non-distended with normoactive bowel sounds. No hepatomegaly. No rebound/guarding. No obvious abdominal masses. Abdominal aorta is normal size without bruit Extremities: Trace edema. no cyanosis, no clubbing, no ulcers  Peripheral : 2+ bilateral upper extremity pulses, 2+ bilateral femoral pulses, 2+ bilateral dorsal pedal pulse Neuro: Alert and oriented. No facial asymmetry. No focal deficit. Moves all extremities spontaneously. Musculoskeletal: Normal muscle tone without kyphosis Psych:  Responds to questions  appropriately with a normal affect.    Assessment: 65 year old male with paroxysmal nonvalvular atrial fibrillation chronic kidney disease hyperlipidemia with malignant hypertension and no current evidence of acute coronary syndrome or congestive heart failure  Plan: 1.  Continue aggressive medication management for hypertension control including his continued use of clonidine, hydralazine, angiotensin receptor blocker, beta-blocker watching closely for concerns for bradycardia 2.  Addition of amlodipine at 10 mg now that he is tolerated 5 mg for better hypertension control with a goal systolic blood pressure below 150 mm 3.  No further cardiac diagnostics necessary at this time due to normal LV systolic function by echocardiogram 4.  Begin ambulation and follow-up for improvements of symptoms and okay for discharge home from cardiac standpoint with follow-up next week  Signed, Corey Skains M.D. [facility] 06/16/2021, 7:23 AM

## 2021-06-16 NOTE — Discharge Summary (Signed)
Physician Discharge Summary   Steve Buck.  male DOB: [birth year]  ULA:[id number]  PCP: Kirk Ruths, MD  Admit date: 06/15/2021 Discharge date: 06/16/2021  Admitted From: home Disposition:  home CODE STATUS: Full code  Discharge Instructions     Discharge instructions   Complete by: As directed    Please continue all your home blood pressure medications.  We have also added amlodipine and hydralazine.  Please take them as directed.  Please check your blood pressure at least 3 times a day and keep a record, and follow up with your PCP for further adjustment.  Your potassium was low, so I have prescribed you 7 days of potassium 40 once daily.  Please have your PCP check your potassium level at followup.     Dr. Enzo Bi [facility] Course:  For full details, please see H&P, progress notes, consult notes and ancillary notes.  Briefly,  Steve Minniefield. is a 65 y.o. male with a past medical history of CHF with preserved ejection fraction, hypertension, chronic kidney disease stage III, diet-controlled diabetes mellitus with diabetic neuropathy, dyslipidemia, gout, anxiety.   This patient presented to the emergency department due to elevated blood pressures at home.  States for the past few weeks his systolic blood pressure has been in the 190s.    Hypertensive emergency: Continued home clonidine 0.3 mg twice daily, Hyzaar 100/25 mg daily, Lopressor 25 mg twice daily.  Cardiology consulted and started pt on amlodipine 5 mg daily and discharged on 10 mg daily.  Also added hydralazine 50 mg BID.  Pt was advised to check BP at least 3 times a day and keep records, and to follow up with PCP for further medication adjustment.  Chest pain, ACS ruled out Mild troponin elevation due to demand ischemia secondary to hypertensive emergency.   --trop 60's flat.  EKG no evidence of acute ischemia.  Echo showed normal LV systolic function.  Cardiology clear for  discharge with outpatient followup.   Hypokalemia:  Continue home Klor-Con 10 MEQ daily.  Extra 40 mEq daily prescribed for 7 days.  Pt advised to follow up with PCP to check potassium level 1 week after discharge.  Dyslipidemia:  Continue home atorvastatin   Anxiety:  Continue home BuSpar   Diabetes mellitus: Diet-controlled.  A1c 6.3.   Gout:  Continue home allopurinol   CHF with preserved ejection fraction:  Appears euvolemic.  Not in acute decompensation.   Chronic kidney disease stage IIIb:  Appears at baseline   Diabetic neuropathy:  Continue home gabapentin   Obesity: BMI 36.   Counseled on lifestyle modifications by admitting physician.   Discharge Diagnoses:  Active Problems:   Hypertensive emergency     Discharge Instructions:  Allergies as of 06/16/2021       Reactions   Lisinopril Rash, Cough        Medication List     TAKE these medications    allopurinol 300 MG tablet Commonly known as: ZYLOPRIM Take 0.5 tablets (150 mg total) by mouth daily.   amLODipine 10 MG tablet Commonly known as: NORVASC Take 1 tablet (10 mg total) by mouth daily.   atorvastatin 40 MG tablet Commonly known as: LIPITOR Take 40 mg by mouth daily.   busPIRone 15 MG tablet Commonly known as: BUSPAR Take 15 mg by mouth 2 (two) times daily.   cloNIDine 0.3 MG tablet Commonly known as: CATAPRES Take 1 tablet (0.3  mg total) by mouth 2 (two) times daily.   colchicine 0.6 MG tablet Take 1 tablet (0.6 mg total) by mouth as needed. Home med. What changed:  how much to take when to take this reasons to take this additional instructions   diclofenac sodium 1 % Gel Commonly known as: Voltaren Apply 2 g topically 4 (four) times daily.   gabapentin 300 MG capsule Commonly known as: NEURONTIN Take 1 capsule (300 mg total) by mouth 3 (three) times daily. Home med. What changed:  when to take this additional instructions   glucose blood test strip 1 each by  Other route 3 (three) times daily. Use as instructed   hydrALAZINE 50 MG tablet Commonly known as: APRESOLINE Take 1 tablet (50 mg total) by mouth 3 (three) times daily.   influenza vaccine adjuvanted 0.5 ML injection Commonly known as: FLUAD Inject 0.5 mLs into the muscle once for 1 dose.   losartan-hydrochlorothiazide 100-25 MG tablet Commonly known as: HYZAAR Take 1 tablet by mouth daily.   metoprolol tartrate 25 MG tablet Commonly known as: LOPRESSOR TAKE 1 TABLET BY MOUTH TWICE A DAY   oxybutynin 5 MG 24 hr tablet Commonly known as: DITROPAN-XL Take 5 mg by mouth daily.   polyethylene glycol powder 17 GM/SCOOP powder Commonly known as: GLYCOLAX/MIRALAX Take 17 g by mouth as needed.   potassium chloride 10 MEQ tablet Commonly known as: KLOR-CON Take 10 mEq by mouth daily.   potassium chloride SA 20 MEQ tablet Commonly known as: KLOR-CON Take 2 tablets (40 mEq total) by mouth daily for 7 days.   psyllium 58.6 % powder Commonly known as: METAMUCIL Take 1 packet by mouth daily.         Follow-up Information     Kirk Ruths, MD Follow up in 1 week(s).   Specialty: Internal Medicine Contact information: [place] [address] [phone]         Wellington Hampshire, MD Follow up in 1 week(s).   Specialty: Cardiology Contact information: [address] [place] [address] [phone]                 Allergies  Allergen Reactions   Lisinopril Rash and Cough     The results of significant diagnostics from this hospitalization (including imaging, microbiology, ancillary and laboratory) are listed below for reference.   Consultations:   Procedures/Studies: DG Chest 2 View  Result Date: 06/15/2021 CLINICAL DATA:  Chest pain. EXAM: CHEST - 2 VIEW COMPARISON:  December 01, 2017 FINDINGS: A loop recorder device is identified. The heart is borderline in size. The hila and  mediastinum are normal. No pneumothorax. No nodules or masses. No focal infiltrate. No edema. IMPRESSION: No active cardiopulmonary disease. Electronically Signed   By: Dorise Bullion III M.D.   On: 06/15/2021 05:45   ECHOCARDIOGRAM COMPLETE  Result Date: 06/16/2021    ECHOCARDIOGRAM REPORT   Patient Name:   Steve Buck. Date of Exam: 06/15/2021 Medical Rec #:  [id number]             Height:       73.0 in Accession #:    [id number]            Weight:       279.0 lb Date of Birth:  [birth year]             BSA:          2.479 m  Patient Age:    68 years              BP:           152/115 mmHg Patient Gender: M                     HR:           82 bpm. Exam Location:  [facility] Procedure: 2D Echo and Intracardiac Opacification Agent Indications:     Chest Pain R07.9  History:         Patient has prior history of Echocardiogram examinations, most                  recent 09/15/2019.  Sonographer:     Kathlen Brunswick RDCS Referring Phys:  [place] Diagnosing Phys: Serafina Royals MD  Sonographer Comments: Technically difficult study due to poor echo windows. Image acquisition challenging due to patient body habitus. IMPRESSIONS  1. Left ventricular ejection fraction, by estimation, is 60 to 65%. The left ventricle has normal function. The left ventricle has no regional wall motion abnormalities. Left ventricular diastolic parameters were normal.  2. Right ventricular systolic function is normal. The right ventricular size is normal.  3. Left atrial size was mildly dilated.  4. The mitral valve is normal in structure. Mild mitral valve regurgitation.  5. The aortic valve is normal in structure. Aortic valve regurgitation is not visualized. FINDINGS  Left Ventricle: Left ventricular ejection fraction, by estimation, is 60 to 65%. The left ventricle has normal function. The left ventricle has no regional wall motion abnormalities. Definity contrast agent was given IV to delineate the left ventricular   endocardial borders. The left ventricular internal cavity size was normal in size. There is no left ventricular hypertrophy. Left ventricular diastolic parameters were normal. Right Ventricle: The right ventricular size is normal. No increase in right ventricular wall thickness. Right ventricular systolic function is normal. Left Atrium: Left atrial size was mildly dilated. Right Atrium: Right atrial size was normal in size. Pericardium: There is no evidence of pericardial effusion. Mitral Valve: The mitral valve is normal in structure. Mild mitral valve regurgitation. Tricuspid Valve: The tricuspid valve is normal in structure. Tricuspid valve regurgitation is trivial. Aortic Valve: The aortic valve is normal in structure. Aortic valve regurgitation is not visualized. Aortic valve peak gradient measures 5.7 mmHg. Pulmonic Valve: The pulmonic valve was not well visualized. Pulmonic valve regurgitation is not visualized. Aorta: The aortic root and ascending aorta are structurally normal, with no evidence of dilitation. IAS/Shunts: No atrial level shunt detected by color flow Doppler.  LEFT VENTRICLE PLAX 2D LVOT diam:     2.30 cm  Diastology LV SV:         57       LV e' medial:    7.18 cm/s LV SV Index:   23       LV E/e' medial:  5.1 LVOT Area:     4.15 cm LV e' lateral:   9.03 cm/s                         LV E/e' lateral: 4.0  RIGHT VENTRICLE RV Basal diam:  2.80 cm RV S prime:     20.10 cm/s TAPSE (M-mode): 1.9 cm LEFT ATRIUM             Index       RIGHT ATRIUM  Index LA Vol (A2C):   44.4 ml 17.91 ml/m RA Area:     14.90 cm LA Vol (A4C):   82.5 ml 33.28 ml/m RA Volume:   32.40 ml  13.07 ml/m LA Biplane Vol: 61.5 ml 24.81 ml/m  AORTIC VALVE AV Area (Vmax): 3.12 cm AV Vmax:        119.00 cm/s AV Peak Grad:   5.7 mmHg LVOT Vmax:      89.40 cm/s LVOT Vmean:     61.800 cm/s LVOT VTI:       0.137 m  AORTA Ao Root diam: 3.40 cm MITRAL VALVE MV Area (PHT): 4.93 cm    SHUNTS MV Decel Time: 154 msec     Systemic VTI:  0.14 m MV E velocity: 36.40 cm/s  Systemic Diam: 2.30 cm MV A velocity: 72.40 cm/s MV E/A ratio:  0.50 Serafina Royals MD Electronically signed by Serafina Royals MD Signature Date/Time: 06/16/2021/6:35:24 AM    Final       Labs: BNP (last 3 results) No results for input(s): BNP in the last 8760 hours. Basic Metabolic Panel: Recent Labs  Lab 06/15/21 0534 06/16/21 0705  NA 138 140  K 3.0* 2.9*  CL 101 99  CO2 27 29  GLUCOSE 120* 109*  BUN 35* 40*  CREATININE 1.70* 1.74*  CALCIUM 9.7 9.7   Liver Function Tests: No results for input(s): AST, ALT, ALKPHOS, BILITOT, PROT, ALBUMIN in the last 168 hours. No results for input(s): LIPASE, AMYLASE in the last 168 hours. No results for input(s): AMMONIA in the last 168 hours. CBC: Recent Labs  Lab 06/15/21 0534 06/16/21 0705  WBC 9.4 8.0  HGB 15.0 15.3  HCT 41.9 44.4  MCV 92.5 92.3  PLT 209 221   Cardiac Enzymes: No results for input(s): CKTOTAL, CKMB, CKMBINDEX, TROPONINI in the last 168 hours. BNP: Invalid input(s): POCBNP CBG: Recent Labs  Lab 06/16/21 0722  GLUCAP 113*   D-Dimer No results for input(s): DDIMER in the last 72 hours. Hgb A1c No results for input(s): HGBA1C in the last 72 hours. Lipid Profile No results for input(s): CHOL, HDL, LDLCALC, TRIG, CHOLHDL, LDLDIRECT in the last 72 hours. Thyroid function studies Recent Labs    06/16/21 0705  TSH 3.138   Anemia work up No results for input(s): VITAMINB12, FOLATE, FERRITIN, TIBC, IRON, RETICCTPCT in the last 72 hours. Urinalysis    Component Value Date/Time   COLORURINE YELLOW (A) 12/01/2017 1358   APPEARANCEUR Clear 03/30/2018 1856   LABSPEC 1.020 12/01/2017 1358   PHURINE 6.0 12/01/2017 1358   GLUCOSEU Negative 03/30/2018 1856   HGBUR SMALL (A) 12/01/2017 1358   BILIRUBINUR Negative 03/30/2018 1856   KETONESUR NEGATIVE 12/01/2017 1358   PROTEINUR 1+ (A) 03/30/2018 1856   PROTEINUR 30 (A) 12/01/2017 1358   NITRITE Negative  03/30/2018 1856   NITRITE NEGATIVE 12/01/2017 1358   LEUKOCYTESUR Negative 03/30/2018 1856   Sepsis Labs Invalid input(s): PROCALCITONIN,  WBC,  LACTICIDVEN Microbiology Recent Results (from the past 240 hour(s))  Resp Panel by RT-PCR (Flu A&B, Covid) Nasopharyngeal Swab     Status: None   Collection Time: 06/15/21 10:04 AM   Specimen: Nasopharyngeal Swab; Nasopharyngeal(NP) swabs in vial transport medium  Result Value Ref Range Status   SARS Coronavirus 2 by RT PCR NEGATIVE NEGATIVE Final    Comment: (NOTE) SARS-CoV-2 target nucleic acids are NOT DETECTED.  The SARS-CoV-2 RNA is generally detectable in upper respiratory specimens during the acute phase of infection. The lowest concentration of SARS-CoV-2 viral  copies this assay can detect is 138 copies/mL. A negative result does not preclude SARS-Cov-2 infection and should not be used as the sole basis for treatment or other patient management decisions. A negative result may occur with  improper specimen collection/handling, submission of specimen other than nasopharyngeal swab, presence of viral mutation(s) within the areas targeted by this assay, and inadequate number of viral copies(<138 copies/mL). A negative result must be combined with clinical observations, patient history, and epidemiological information. The expected result is Negative.  Fact Sheet for Patients:  EntrepreneurPulse.com.au  Fact Sheet for Healthcare Providers:  IncredibleEmployment.be  This test is no t yet approved or cleared by the [place] FDA and  has been authorized for detection and/or diagnosis of SARS-CoV-2 by FDA under an Emergency Use Authorization (EUA). This EUA will remain  in effect (meaning this test can be used) for the duration of the COVID-19 declaration under Section 564(b)(1) of the Act, 21 U.S.C.section 360bbb-3(b)(1), unless the authorization is terminated  or revoked sooner.        Influenza A by PCR NEGATIVE NEGATIVE Final   Influenza B by PCR NEGATIVE NEGATIVE Final    Comment: (NOTE) The Xpert Xpress SARS-CoV-2/FLU/RSV plus assay is intended as an aid in the diagnosis of influenza from Nasopharyngeal swab specimens and should not be used as a sole basis for treatment. Nasal washings and aspirates are unacceptable for Xpert Xpress SARS-CoV-2/FLU/RSV testing.  Fact Sheet for Patients: EntrepreneurPulse.com.au  Fact Sheet for Healthcare Providers: IncredibleEmployment.be  This test is not yet approved or cleared by the [place] FDA and has been authorized for detection and/or diagnosis of SARS-CoV-2 by FDA under an Emergency Use Authorization (EUA). This EUA will remain in effect (meaning this test can be used) for the duration of the COVID-19 declaration under Section 564(b)(1) of the Act, 21 U.S.C. section 360bbb-3(b)(1), unless the authorization is terminated or revoked.  Performed at [facility], [address] [place], [place] [address]      Total time spend on discharging this patient, including the last patient exam, discussing the hospital stay, instructions for ongoing care as it relates to all pertinent caregivers, as well as preparing the medical discharge records, prescriptions, and/or referrals as applicable, is 45 minutes.    Enzo Bi, MD  [facility] 06/16/2021, 10:16 AM

## 2021-06-17 LAB — HEMOGLOBIN A1C
Hgb A1c MFr Bld: 6.3 % — ABNORMAL HIGH (ref 4.8–5.6)
Mean Plasma Glucose: 134 mg/dL

## 2021-06-24 DIAGNOSIS — N183 Chronic kidney disease, stage 3 unspecified: Secondary | ICD-10-CM | POA: Diagnosis not present

## 2021-06-24 DIAGNOSIS — E1122 Type 2 diabetes mellitus with diabetic chronic kidney disease: Secondary | ICD-10-CM | POA: Diagnosis not present

## 2021-06-24 DIAGNOSIS — I10 Essential (primary) hypertension: Secondary | ICD-10-CM | POA: Diagnosis not present

## 2021-08-13 ENCOUNTER — Telehealth: Payer: Self-pay

## 2021-08-13 NOTE — Telephone Encounter (Signed)
The patient states he is being seen by a different cardiologist. He did not want to speak with anyone here because Dr. Caryl Comes made he upset. I cancelled upcoming remotes. Pt states he will have new cardiologist to give [place] a call to released him in transmission.

## 2021-11-19 DIAGNOSIS — N183 Chronic kidney disease, stage 3 unspecified: Secondary | ICD-10-CM | POA: Diagnosis not present

## 2021-11-19 DIAGNOSIS — E1122 Type 2 diabetes mellitus with diabetic chronic kidney disease: Secondary | ICD-10-CM | POA: Diagnosis not present

## 2021-11-19 DIAGNOSIS — I10 Essential (primary) hypertension: Secondary | ICD-10-CM | POA: Diagnosis not present

## 2021-11-21 DIAGNOSIS — E1122 Type 2 diabetes mellitus with diabetic chronic kidney disease: Secondary | ICD-10-CM | POA: Diagnosis not present

## 2021-11-21 DIAGNOSIS — E1121 Type 2 diabetes mellitus with diabetic nephropathy: Secondary | ICD-10-CM | POA: Diagnosis not present

## 2021-11-21 DIAGNOSIS — N183 Chronic kidney disease, stage 3 unspecified: Secondary | ICD-10-CM | POA: Diagnosis not present

## 2021-11-21 DIAGNOSIS — I10 Essential (primary) hypertension: Secondary | ICD-10-CM | POA: Diagnosis not present

## 2021-11-21 DIAGNOSIS — E1142 Type 2 diabetes mellitus with diabetic polyneuropathy: Secondary | ICD-10-CM | POA: Diagnosis not present

## 2021-11-21 DIAGNOSIS — Z0000 Encounter for general adult medical examination without abnormal findings: Secondary | ICD-10-CM | POA: Diagnosis not present

## 2021-11-21 DIAGNOSIS — E1159 Type 2 diabetes mellitus with other circulatory complications: Secondary | ICD-10-CM | POA: Diagnosis not present

## 2021-11-21 DIAGNOSIS — I152 Hypertension secondary to endocrine disorders: Secondary | ICD-10-CM | POA: Diagnosis not present

## 2021-11-21 DIAGNOSIS — I480 Paroxysmal atrial fibrillation: Secondary | ICD-10-CM | POA: Diagnosis not present

## 2021-11-21 DIAGNOSIS — I5032 Chronic diastolic (congestive) heart failure: Secondary | ICD-10-CM | POA: Diagnosis not present

## 2022-05-15 DIAGNOSIS — N183 Chronic kidney disease, stage 3 unspecified: Secondary | ICD-10-CM | POA: Diagnosis not present

## 2022-05-15 DIAGNOSIS — E1121 Type 2 diabetes mellitus with diabetic nephropathy: Secondary | ICD-10-CM | POA: Diagnosis not present

## 2022-05-15 DIAGNOSIS — E1122 Type 2 diabetes mellitus with diabetic chronic kidney disease: Secondary | ICD-10-CM | POA: Diagnosis not present

## 2022-05-22 DIAGNOSIS — E1159 Type 2 diabetes mellitus with other circulatory complications: Secondary | ICD-10-CM | POA: Diagnosis not present

## 2022-05-22 DIAGNOSIS — E1122 Type 2 diabetes mellitus with diabetic chronic kidney disease: Secondary | ICD-10-CM | POA: Diagnosis not present

## 2022-05-22 DIAGNOSIS — E1142 Type 2 diabetes mellitus with diabetic polyneuropathy: Secondary | ICD-10-CM | POA: Diagnosis not present

## 2022-05-22 DIAGNOSIS — N183 Chronic kidney disease, stage 3 unspecified: Secondary | ICD-10-CM | POA: Diagnosis not present

## 2022-05-22 DIAGNOSIS — I5032 Chronic diastolic (congestive) heart failure: Secondary | ICD-10-CM | POA: Diagnosis not present

## 2022-05-22 DIAGNOSIS — I152 Hypertension secondary to endocrine disorders: Secondary | ICD-10-CM | POA: Diagnosis not present

## 2022-05-22 DIAGNOSIS — I480 Paroxysmal atrial fibrillation: Secondary | ICD-10-CM | POA: Diagnosis not present

## 2022-05-22 DIAGNOSIS — E782 Mixed hyperlipidemia: Secondary | ICD-10-CM | POA: Diagnosis not present

## 2022-06-09 DIAGNOSIS — E559 Vitamin D deficiency, unspecified: Secondary | ICD-10-CM | POA: Diagnosis not present

## 2022-06-09 DIAGNOSIS — E1169 Type 2 diabetes mellitus with other specified complication: Secondary | ICD-10-CM | POA: Diagnosis not present

## 2022-06-09 DIAGNOSIS — I5032 Chronic diastolic (congestive) heart failure: Secondary | ICD-10-CM | POA: Diagnosis not present

## 2022-06-09 DIAGNOSIS — I480 Paroxysmal atrial fibrillation: Secondary | ICD-10-CM | POA: Diagnosis not present

## 2022-06-09 DIAGNOSIS — I10 Essential (primary) hypertension: Secondary | ICD-10-CM | POA: Diagnosis not present

## 2022-06-09 DIAGNOSIS — E1142 Type 2 diabetes mellitus with diabetic polyneuropathy: Secondary | ICD-10-CM | POA: Diagnosis not present

## 2022-07-13 ENCOUNTER — Emergency Department: Payer: PPO

## 2022-07-13 ENCOUNTER — Inpatient Hospital Stay
Admission: EM | Admit: 2022-07-13 | Discharge: 2022-07-17 | DRG: 871 | Disposition: A | Discharge status: Home / Self Care | Payer: PPO | Attending: Internal Medicine | Admitting: Internal Medicine

## 2022-07-13 ENCOUNTER — Encounter: Payer: Self-pay | Admitting: Internal Medicine

## 2022-07-13 ENCOUNTER — Other Ambulatory Visit: Payer: Self-pay

## 2022-07-13 DIAGNOSIS — E669 Obesity, unspecified: Secondary | ICD-10-CM | POA: Diagnosis not present

## 2022-07-13 DIAGNOSIS — R6520 Severe sepsis without septic shock: Secondary | ICD-10-CM | POA: Diagnosis not present

## 2022-07-13 DIAGNOSIS — N179 Acute kidney failure, unspecified: Secondary | ICD-10-CM

## 2022-07-13 DIAGNOSIS — E1122 Type 2 diabetes mellitus with diabetic chronic kidney disease: Secondary | ICD-10-CM | POA: Diagnosis not present

## 2022-07-13 DIAGNOSIS — J9601 Acute respiratory failure with hypoxia: Secondary | ICD-10-CM | POA: Diagnosis present

## 2022-07-13 DIAGNOSIS — E876 Hypokalemia: Secondary | ICD-10-CM | POA: Diagnosis not present

## 2022-07-13 DIAGNOSIS — E1140 Type 2 diabetes mellitus with diabetic neuropathy, unspecified: Secondary | ICD-10-CM | POA: Diagnosis present

## 2022-07-13 DIAGNOSIS — N1832 Chronic kidney disease, stage 3b: Secondary | ICD-10-CM | POA: Diagnosis not present

## 2022-07-13 DIAGNOSIS — I152 Hypertension secondary to endocrine disorders: Secondary | ICD-10-CM | POA: Diagnosis not present

## 2022-07-13 DIAGNOSIS — Z888 Allergy status to other drugs, medicaments and biological substances status: Secondary | ICD-10-CM | POA: Diagnosis not present

## 2022-07-13 DIAGNOSIS — Z95818 Presence of other cardiac implants and grafts: Secondary | ICD-10-CM | POA: Diagnosis not present

## 2022-07-13 DIAGNOSIS — R059 Cough, unspecified: Secondary | ICD-10-CM | POA: Diagnosis not present

## 2022-07-13 DIAGNOSIS — E1169 Type 2 diabetes mellitus with other specified complication: Secondary | ICD-10-CM | POA: Diagnosis present

## 2022-07-13 DIAGNOSIS — Z8249 Family history of ischemic heart disease and other diseases of the circulatory system: Secondary | ICD-10-CM | POA: Diagnosis not present

## 2022-07-13 DIAGNOSIS — A419 Sepsis, unspecified organism: Principal | ICD-10-CM | POA: Diagnosis present

## 2022-07-13 DIAGNOSIS — R079 Chest pain, unspecified: Secondary | ICD-10-CM | POA: Diagnosis not present

## 2022-07-13 DIAGNOSIS — Z1152 Encounter for screening for COVID-19: Secondary | ICD-10-CM | POA: Diagnosis not present

## 2022-07-13 DIAGNOSIS — Z6836 Body mass index (BMI) 36.0-36.9, adult: Secondary | ICD-10-CM | POA: Diagnosis not present

## 2022-07-13 DIAGNOSIS — N183 Chronic kidney disease, stage 3 unspecified: Secondary | ICD-10-CM | POA: Diagnosis present

## 2022-07-13 DIAGNOSIS — J189 Pneumonia, unspecified organism: Secondary | ICD-10-CM | POA: Diagnosis not present

## 2022-07-13 DIAGNOSIS — I5032 Chronic diastolic (congestive) heart failure: Secondary | ICD-10-CM | POA: Diagnosis not present

## 2022-07-13 DIAGNOSIS — Z79899 Other long term (current) drug therapy: Secondary | ICD-10-CM

## 2022-07-13 DIAGNOSIS — Z599 Problem related to housing and economic circumstances, unspecified: Secondary | ICD-10-CM

## 2022-07-13 DIAGNOSIS — E1159 Type 2 diabetes mellitus with other circulatory complications: Secondary | ICD-10-CM | POA: Diagnosis not present

## 2022-07-13 HISTORY — DX: Acute respiratory failure with hypoxia: J96.01

## 2022-07-13 HISTORY — DX: Sepsis, unspecified organism: A41.9

## 2022-07-13 LAB — BASIC METABOLIC PANEL
Anion gap: 13 (ref 5–15)
BUN: 32 mg/dL — ABNORMAL HIGH (ref 8–23)
CO2: 30 mmol/L (ref 22–32)
Calcium: 9 mg/dL (ref 8.9–10.3)
Chloride: 94 mmol/L — ABNORMAL LOW (ref 98–111)
Creatinine, Ser: 1.93 mg/dL — ABNORMAL HIGH (ref 0.61–1.24)
GFR, Estimated: 37 mL/min — ABNORMAL LOW (ref >60)
Glucose, Bld: 122 mg/dL — ABNORMAL HIGH (ref 70–99)
Potassium: 2.7 mmol/L — CL (ref 3.5–5.1)
Sodium: 137 mmol/L (ref 135–145)

## 2022-07-13 LAB — CBC
HCT: 37.9 % — ABNORMAL LOW (ref 39.0–52.0)
Hemoglobin: 12.9 g/dL — ABNORMAL LOW (ref 13.0–17.0)
MCH: 32 pg (ref 26.0–34.0)
MCHC: 34 g/dL (ref 30.0–36.0)
MCV: 94 fL (ref 80.0–100.0)
Platelets: 288 10*3/uL (ref 150–400)
RBC: 4.03 MIL/uL — ABNORMAL LOW (ref 4.22–5.81)
RDW: 13.8 % (ref 11.5–15.5)
WBC: 18.2 10*3/uL — ABNORMAL HIGH (ref 4.0–10.5)
nRBC: 0 % (ref 0.0–0.2)

## 2022-07-13 LAB — BRAIN NATRIURETIC PEPTIDE: B Natriuretic Peptide: 320 pg/mL — ABNORMAL HIGH (ref 0.0–100.0)

## 2022-07-13 LAB — LACTIC ACID, PLASMA
Lactic Acid, Venous: 1.2 mmol/L (ref 0.5–1.9)
Lactic Acid, Venous: 1.4 mmol/L (ref 0.5–1.9)

## 2022-07-13 LAB — CBG MONITORING, ED: Glucose-Capillary: 111 mg/dL — ABNORMAL HIGH (ref 70–99)

## 2022-07-13 LAB — RESP PANEL BY RT-PCR (FLU A&B, COVID) ARPGX2
Influenza A by PCR: NEGATIVE
Influenza B by PCR: NEGATIVE
SARS Coronavirus 2 by RT PCR: NEGATIVE

## 2022-07-13 LAB — GLUCOSE, CAPILLARY: Glucose-Capillary: 204 mg/dL — ABNORMAL HIGH (ref 70–99)

## 2022-07-13 LAB — MAGNESIUM: Magnesium: 2.1 mg/dL (ref 1.7–2.4)

## 2022-07-13 LAB — TROPONIN I (HIGH SENSITIVITY)
Troponin I (High Sensitivity): 137 ng/L (ref <18)
Troponin I (High Sensitivity): 152 ng/L (ref <18)

## 2022-07-13 MED ORDER — SODIUM CHLORIDE 0.9 % IV SOLN
2.0000 g | INTRAVENOUS | Status: DC
  Administered 2022-07-13 – 2022-07-16 (×4): 2 g via INTRAVENOUS
  Filled 2022-07-13 (×5): qty 20

## 2022-07-13 MED ORDER — ASPIRIN 81 MG PO CHEW
324.0000 mg | CHEWABLE_TABLET | Freq: Once | ORAL | Status: AC
  Administered 2022-07-13: 324 mg via ORAL
  Filled 2022-07-13: qty 4

## 2022-07-13 MED ORDER — IPRATROPIUM-ALBUTEROL 0.5-2.5 (3) MG/3ML IN SOLN
3.0000 mL | Freq: Once | RESPIRATORY_TRACT | Status: AC
  Administered 2022-07-13: 3 mL via RESPIRATORY_TRACT
  Filled 2022-07-13: qty 3

## 2022-07-13 MED ORDER — POTASSIUM CHLORIDE CRYS ER 20 MEQ PO TBCR
40.0000 meq | EXTENDED_RELEASE_TABLET | Freq: Once | ORAL | Status: AC
  Administered 2022-07-13: 40 meq via ORAL
  Filled 2022-07-13: qty 2

## 2022-07-13 MED ORDER — SODIUM CHLORIDE 0.9 % IV SOLN: 500.0000 mg | INTRAVENOUS | Status: DC

## 2022-07-13 MED ORDER — LACTATED RINGERS IV SOLN: INTRAVENOUS | Status: DC

## 2022-07-13 MED ORDER — GUAIFENESIN-DM 100-10 MG/5ML PO SYRP
5.0000 mL | ORAL_SOLUTION | ORAL | Status: DC | PRN
  Administered 2022-07-13: 5 mL via ORAL
  Filled 2022-07-13: qty 10

## 2022-07-13 MED ORDER — SODIUM CHLORIDE 0.9 % IV SOLN
500.0000 mg | INTRAVENOUS | Status: DC
  Administered 2022-07-13 – 2022-07-15 (×3): 500 mg via INTRAVENOUS
  Filled 2022-07-13 (×4): qty 5

## 2022-07-13 MED ORDER — ACETAMINOPHEN 325 MG PO TABS
650.0000 mg | ORAL_TABLET | Freq: Four times a day (QID) | ORAL | Status: DC | PRN
  Administered 2022-07-14 – 2022-07-16 (×5): 650 mg via ORAL
  Filled 2022-07-13 (×5): qty 2

## 2022-07-13 MED ORDER — SODIUM CHLORIDE 0.9 % IV BOLUS
500.0000 mL | Freq: Once | INTRAVENOUS | Status: AC
  Administered 2022-07-13: 500 mL via INTRAVENOUS

## 2022-07-13 MED ORDER — POTASSIUM CHLORIDE 10 MEQ/100ML IV SOLN
10.0000 meq | INTRAVENOUS | Status: AC
  Administered 2022-07-13 (×3): 10 meq via INTRAVENOUS
  Filled 2022-07-13 (×3): qty 100

## 2022-07-13 MED ORDER — ACETAMINOPHEN 325 MG PO TABS
650.0000 mg | ORAL_TABLET | Freq: Once | ORAL | Status: AC
  Administered 2022-07-13: 650 mg via ORAL
  Filled 2022-07-13: qty 2

## 2022-07-13 MED ORDER — IOHEXOL 350 MG/ML SOLN
60.0000 mL | Freq: Once | INTRAVENOUS | Status: AC | PRN
  Administered 2022-07-13: 60 mL via INTRAVENOUS

## 2022-07-13 MED ORDER — LACTATED RINGERS IV SOLN: INTRAVENOUS | Status: AC

## 2022-07-13 MED ORDER — ENOXAPARIN SODIUM 80 MG/0.8ML IJ SOSY
0.5000 mg/kg | PREFILLED_SYRINGE | INTRAMUSCULAR | Status: DC
  Administered 2022-07-14 – 2022-07-16 (×3): 62.5 mg via SUBCUTANEOUS
  Filled 2022-07-13 (×3): qty 0.63

## 2022-07-13 MED ORDER — SODIUM CHLORIDE 0.9 % IV SOLN: 2.0000 g | INTRAVENOUS | Status: DC

## 2022-07-13 NOTE — ED Notes (Signed)
Pt transported to CT at this time.

## 2022-07-13 NOTE — ED Notes (Signed)
Patient transported to CT 

## 2022-07-13 NOTE — ED Triage Notes (Signed)
Pt in via [facility] with c/o SOB on exertion. Pt started with flu like sx's Tuesday ow worse. 102.0 tmep, 140/80, HR 88, hx of a-fib, 100% on 2L

## 2022-07-13 NOTE — ED Triage Notes (Signed)
Pt presents from [facility] after presenting there for feeling "flu like symptoms" since Wednesday.  Checked his pulse ox at home with his dad's meter and saw it was in the 80s.  Pt has had fever at home.  2L [place] placed on pt at [facility].  Brought over for eval.

## 2022-07-13 NOTE — ED Notes (Signed)
Advised nurse that patient has ready bed 

## 2022-07-13 NOTE — ED Provider Notes (Signed)
[facility] Provider Note    Event Date/Time   First MD Initiated Contact with Patient 07/13/22 1520     (approximate)   History   Shortness of Breath   HPI  Steve Washabaugh. is a 66 y.o. male with a history of marijuana use but no history of COPD, asthma, emphysema or heart disease presents to the ER for evaluation of several days of generalized malaise worsening shortness of breath productive cough fevers and chills.  States he is measuring his home oxygen with a pulse oximeter that he had at home over the past 24 hours and has been down in the 80s.  Typically 98%.  Has not been on any antibiotics no steroids and breathing treatments.  Went to [place] clinic was found to be hypoxic placed on 2 L nasal cannula and brought over to the ER for further evaluation.     Physical Exam   Triage Vital Signs: ED Triage Vitals  Enc Vitals Group     BP 07/13/22 1500 (!) 137/98     Pulse Rate 07/13/22 1500 86     Resp 07/13/22 1500 18     Temp --      Temp src --      SpO2 07/13/22 1500 91 %     Weight --      Height --      Head Circumference --      Peak Flow --      Pain Score 07/13/22 1459 6     Pain Loc --      Pain Edu? --      Excl. in [place]? --     Most recent vital signs: Vitals:   07/13/22 1627 07/13/22 1630  BP:  (!) 149/91  Pulse:  90  Resp:  20  Temp: 98.7 F (37.1 C)   SpO2:  94%     Constitutional: Alert  Eyes: Conjunctivae are normal.  Head: Atraumatic. Nose: No congestion/rhinnorhea. Mouth/Throat: Mucous membranes are moist.   Neck: Painless ROM.  Cardiovascular:   Good peripheral circulation. Respiratory: Normal respiratory effort.  Coarse bibasilar breath sounds. Gastrointestinal: Soft and nontender.  Musculoskeletal:  no deformity Neurologic:  MAE spontaneously. No gross focal neurologic deficits are appreciated.  Skin:  Skin is warm, dry and intact. No rash noted. Psychiatric: Mood and affect are normal. Speech and  behavior are normal.    ED Results / Procedures / Treatments   Labs (all labs ordered are listed, but only abnormal results are displayed) Labs Reviewed  BASIC METABOLIC PANEL - Abnormal; Notable for the following components:      Result Value   Potassium 2.7 (*)    Chloride 94 (*)    Glucose, Bld 122 (*)    BUN 32 (*)    Creatinine, Ser 1.93 (*)    GFR, Estimated 37 (*)    All other components within normal limits  CBC - Abnormal; Notable for the following components:   WBC 18.2 (*)    RBC 4.03 (*)    Hemoglobin 12.9 (*)    HCT 37.9 (*)    All other components within normal limits  CBG MONITORING, ED - Abnormal; Notable for the following components:   Glucose-Capillary 111 (*)    All other components within normal limits  TROPONIN I (HIGH SENSITIVITY) - Abnormal; Notable for the following components:   Troponin I (High Sensitivity) 137 (*)    All other components within normal limits  RESP PANEL BY  RT-PCR (FLU A&B, COVID) ARPGX2  CULTURE, BLOOD (ROUTINE X 2)  CULTURE, BLOOD (ROUTINE X 2)  LACTIC ACID, PLASMA  MAGNESIUM  LACTIC ACID, PLASMA  BRAIN NATRIURETIC PEPTIDE  BRAIN NATRIURETIC PEPTIDE  TROPONIN I (HIGH SENSITIVITY)     EKG  ED ECG REPORT I, Merlyn Lot, the attending physician, personally viewed and interpreted this ECG.   Date: 07/13/2022  EKG Time: 15:02  Rate: 85  Rhythm: sinus  Axis: left  Intervals: lvh  ST&T Change: nonspecific st abn, no stemi    RADIOLOGY Please see ED Course for my review and interpretation.  I personally reviewed all radiographic images ordered to evaluate for the above acute complaints and reviewed radiology reports and findings.  These findings were personally discussed with the patient.  Please see medical record for radiology report.    PROCEDURES:  Critical Care performed: Yes, see critical care procedure note(s)  .Critical Care  Performed by: Merlyn Lot, MD Authorized by: Merlyn Lot, MD    Critical care provider statement:    Critical care time (minutes):  40   Critical care was necessary to treat or prevent imminent or life-threatening deterioration of the following conditions:  Respiratory failure   Critical care was time spent personally by me on the following activities:  Ordering and performing treatments and interventions, ordering and review of laboratory studies, ordering and review of radiographic studies, pulse oximetry, re-evaluation of patient's condition, review of old charts, obtaining history from patient or surrogate, examination of patient, evaluation of patient's response to treatment, discussions with primary provider, discussions with consultants and development of treatment plan with patient or surrogate    MEDICATIONS ORDERED IN ED: Medications  lactated ringers infusion ( Intravenous New Bag/Given 07/13/22 1647)  cefTRIAXone (ROCEPHIN) 2 g in sodium chloride 0.9 % 100 mL IVPB (0 g Intravenous Stopped 07/13/22 1643)  azithromycin (ZITHROMAX) 500 mg in sodium chloride 0.9 % 250 mL IVPB (500 mg Intravenous New Bag/Given 07/13/22 1601)  potassium chloride 10 mEq in 100 mL IVPB (has no administration in time range)  ipratropium-albuterol (DUONEB) 0.5-2.5 (3) MG/3ML nebulizer solution 3 mL (has no administration in time range)  aspirin chewable tablet 324 mg (has no administration in time range)  ipratropium-albuterol (DUONEB) 0.5-2.5 (3) MG/3ML nebulizer solution 3 mL (has no administration in time range)  sodium chloride 0.9 % bolus 500 mL (0 mLs Intravenous Stopped 07/13/22 1643)  iohexol (OMNIPAQUE) 350 MG/ML injection 60 mL (60 mLs Intravenous Contrast Given 07/13/22 1611)  acetaminophen (TYLENOL) tablet 650 mg (650 mg Oral Given 07/13/22 1627)     IMPRESSION / MDM / [place] / ED COURSE  I reviewed the triage vital signs and the nursing notes.                              Differential diagnosis includes, but is not limited to, Asthma,  copd, CHF, pna, ptx, malignancy, Pe, anemia  Patient presenting to the ER for evaluation of symptoms as described above.  Based on symptoms, risk factors and considered above differential, this presenting complaint could reflect a potentially life-threatening illness therefore the patient will be placed on continuous pulse oximetry and telemetry for monitoring.  Laboratory evaluation will be sent to evaluate for the above complaints.     Clinical Course as of 07/13/22 1651  Sun Jul 13, 2022  1529 Chest x-ray on my review and interpretation does not show any evidence of pneumothorax.  Await formal radiology  report. [PR]  9798 Patient's troponin is significantly elevated.  Given his new hypoxia this may be demand ischemia but also concern for PE as chest x-ray does not show any evidence of pneumonia.  Order CTA to further evaluate. [PR]  9211 CTA on my review and interpretation does not show any evidence of PE but is concerning for right middle and lower lobe pneumonia. [PR]  36 CT with findings consistent with pneumonia.  Admitted elevated troponin likely related to demand ischemia in the setting of his hypoxia.  He remained stable on 2 L nasal cannula.  I have consulted hospitalist for admission. [PR]    Clinical Course User Index [PR] Merlyn Lot, MD    FINAL CLINICAL IMPRESSION(S) / ED DIAGNOSES   Final diagnoses:  Acute respiratory failure with hypoxia ([place])  AKI (acute kidney injury) ([place])     Rx / DC Orders   ED Discharge Orders     None        Note:  This document was prepared using Dragon voice recognition software and may include unintentional dictation errors.    Merlyn Lot, MD 07/13/22 1651

## 2022-07-13 NOTE — H&P (Signed)
History and Physical:    Steve Buck.   HFW:[id number] DOB: 08/31/1956 DOA: 07/13/2022  Referring MD/provider: Merlyn Lot, MD PCP: Kirk Ruths, MD   Patient coming from: Home  Chief Complaint: Cough and shortness of breath  History of Present Illness:   Steve Buck. is a 66 y.o. male with medical history significant for chronic diastolic CHF, type II DM hypertension, obesity, who presented to the hospital with cough productive of greenish sputum, chest pain from coughing, shortness of breath, sore throat and fever.  Symptoms started about 4 days ago.  He had been taking ibuprofen for pain and fever.  He noticed that his symptoms were worsening.  His oxygen saturation was 83% last.  He was able to get oxygen saturation up into the 90s after taking deep breaths.  He went to a walk-in clinic this afternoon and he was referred to the emergency room for further management.  Of note, he said he had flulike symptoms (malaise, cough and runny nose, about a week prior to the development of his current symptoms.  However those symptoms lasted a few days and he was well until about 4 days ago when he felt sick again. He has not been around any sick contacts.  He has nausea but no vomiting, diarrhea, abdominal pain urinary symptoms, dizziness or syncope.   ED Course:  The patient was given IV fluids, IV ceftriaxone and IV azithromycin.  He was also given IV potassium chloride.  ROS:   ROS all other systems reviewed were negative.  Past Medical History:   Past Medical History:  Diagnosis Date   (HFpEF) heart failure with preserved ejection fraction ([place])    a. 12/2017 Echo: EF 60-65%, no rwma, Gr1DD, mild MR, mildly dil LA, nl RV fxn.   Allergy    Seasonal   Chest pain    a. 12/2017 Lexiscan MV: EF 37% (nl by echo). Small mild, fixed region of apical thinning, likely attenuation artifact. No ischemia. Low risk.   CKD (chronic kidney disease) stage 3, GFR 30-59  ml/min (HCC) 03/30/2018   Diabetic nephropathy associated with type 2 diabetes mellitus ([place]) 03/25/2018   DKA (diabetic ketoacidoses) 12/01/2017   Heart murmur    a. 12/2017 Echo: Mild MR.   History of Bell's palsy 03/30/2018   Hypertension    Morbid obesity ([place])    Palpitations    PVC's (premature ventricular contractions)    a. 05/2018 Holter: RSR, avg rate of 60. Freq PVC's (6.4% burden). Some ventricular bigeminy/trigeminy.   Type 2 diabetes mellitus, uncontrolled, with neuropathy     Past Surgical History:   Past Surgical History:  Procedure Laterality Date   colonoscvopy  2015   FOOT SURGERY  around [place] N/A 07/08/2018   Procedure: LOOP RECORDER INSERTION;  Surgeon: Deboraha Sprang, MD;  Location: [facility];  Service: Cardiovascular;  Laterality: N/A;    Social History:   Social History   Socioeconomic History   Marital status: Married    Spouse name: Not on file   Number of children: 3   Years of education: Not on file   Highest education level: Not on file  Occupational History   Occupation: retired    Comment: Delivery driver-produce  Tobacco Use   Smoking status: Never   Smokeless tobacco: Never  Vaping Use   Vaping Use: Never used  Substance and Sexual Activity   Alcohol use: Not Currently   Drug use:  Yes    Types: Marijuana    Comment: occasionally   Sexual activity: Not on file  Other Topics Concern   Not on file  Social History Narrative   Pt said that everything was going well.    Said the main stress for him is paying for medical care, but things are going well here.   No alcohol use noted.  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   Social Determinants of Health   Financial Resource Strain: Medium Risk (03/25/2018)   Overall Financial Resource Strain (CARDIA)    Difficulty of Paying Living Expenses: Somewhat hard  Food Insecurity: No Food Insecurity (03/25/2018)   Hunger Vital Sign    Worried  About Running Out of Food in the Last Year: Never true    Ran Out of Food in the Last Year: Never true  Transportation Needs: No Transportation Needs (03/25/2018)   PRAPARE - Hydrologist (Medical): No    Lack of Transportation (Non-Medical): No  Physical Activity: Insufficiently Active (03/25/2018)   Exercise Vital Sign    Days of Exercise per Week: 5 days    Minutes of Exercise per Session: 20 min  Stress: Stress Concern Present (03/25/2018)   [place]    Feeling of Stress : To some extent  Social Connections: Moderately Integrated (03/25/2018)   Social Connection and Isolation Panel [NHANES]    Frequency of Communication with Friends and Family: Three times a week    Frequency of Social Gatherings with Friends and Family: Twice a week    Attends Religious Services: More than 4 times per year    Active Member of [employer] or Organizations: No    Attends Archivist Meetings: Never    Marital Status: Married  Human resources officer Violence: Not At Risk (03/25/2018)   Humiliation, Afraid, Rape, and Kick questionnaire    Fear of Current or Ex-Partner: No    Emotionally Abused: No    Physically Abused: No    Sexually Abused: No    Allergies   Lisinopril  Family history:   Family History  Problem Relation Age of Onset   Cancer Mother        Skin   CAD Father    Arthritis Father     Current Medications:   Prior to Admission medications   Medication Sig Start Date End Date Taking? Authorizing Provider  allopurinol (ZYLOPRIM) 300 MG tablet Take 0.5 tablets (150 mg total) by mouth daily. 03/17/19   Tukov-Yual, Arlyss Gandy, NP  amLODipine (NORVASC) 10 MG tablet Take 1 tablet (10 mg total) by mouth daily. 06/16/21 09/14/21  Enzo Bi, MD  atorvastatin (LIPITOR) 40 MG tablet Take 40 mg by mouth daily. 04/19/21   [provider]  busPIRone (BUSPAR) 15 MG tablet Take 15 mg by mouth 2  (two) times daily. 05/25/21   [provider]  cloNIDine (CATAPRES) 0.3 MG tablet Take 1 tablet (0.3 mg total) by mouth 2 (two) times daily. 02/03/19   Tukov-Yual, Arlyss Gandy, NP  colchicine 0.6 MG tablet Take 1 tablet (0.6 mg total) by mouth as needed. Home med. 06/16/21   Enzo Bi, MD  diclofenac sodium (VOLTAREN) 1 % GEL Apply 2 g topically 4 (four) times daily. 03/18/19   Tukov-Yual, Arlyss Gandy, NP  gabapentin (NEURONTIN) 300 MG capsule Take 1 capsule (300 mg total) by mouth 3 (three) times daily. Home med. 06/16/21   Enzo Bi, MD  glucose blood test strip 1 each by Other route 3 (three) times daily. Use as instructed 02/03/19   Tukov-Yual, Arlyss Gandy, NP  hydrALAZINE (APRESOLINE) 50 MG tablet Take 1 tablet (50 mg total) by mouth 3 (three) times daily. 06/16/21 09/14/21  Enzo Bi, MD  losartan-hydrochlorothiazide (HYZAAR) 100-25 MG tablet Take 1 tablet by mouth daily. 06/16/21 09/14/21  Enzo Bi, MD  metoprolol tartrate (LOPRESSOR) 25 MG tablet TAKE 1 TABLET BY MOUTH TWICE A DAY 06/08/20   Deboraha Sprang, MD  oxybutynin (DITROPAN-XL) 5 MG 24 hr tablet Take 5 mg by mouth daily. 04/19/21   [provider]  polyethylene glycol powder (GLYCOLAX/MIRALAX) 17 GM/SCOOP powder Take 17 g by mouth as needed. 02/03/19   Tukov-Yual, Arlyss Gandy, NP  potassium chloride (KLOR-CON) 10 MEQ tablet Take 10 mEq by mouth daily. 07/19/19   [provider]  potassium chloride SA (KLOR-CON) 20 MEQ tablet Take 2 tablets (40 mEq total) by mouth daily for 7 days. 06/16/21 06/23/21  Enzo Bi, MD  psyllium (METAMUCIL) 58.6 % powder Take 1 packet by mouth daily. 02/03/19   Erlene Quan, NP    Physical Exam:   Vitals:   07/13/22 1507 07/13/22 1627 07/13/22 1630 07/13/22 1700  BP: 133/77  (!) 149/91 130/60  Pulse:   90 76  Resp:   20 15  Temp:  98.7 F (37.1 C)    TempSrc:  Oral    SpO2:   94% 97%     Physical Exam: Blood pressure 130/60, pulse 76, temperature 98.7 F (37.1 C),  temperature source Oral, resp. rate 15, SpO2 97 %. Gen: No acute distress. Head: Normocephalic, atraumatic. Eyes: Pupils equal, round and reactive to light. Extraocular movements intact.  Sclerae nonicteric.  Mouth: Moist mucous membranes Neck: Supple, no thyromegaly, no lymphadenopathy, no jugular venous distention. Chest: Entry adequate bilaterally.  Rhonchi and rales heard at the right lung base CV: Heart sounds are regular with an S1, S2. No murmurs, rubs or gallops.  Abdomen: Soft, nontender, nondistended with normal active bowel sounds. No palpable masses. Extremities: Extremities are without clubbing, or cyanosis. No edema. Pedal pulses 2+.  Skin: Warm and dry.  Few scattered erythematous macular rash on the chest Neuro: Alert and oriented times 3; grossly nonfocal.  Psych: Insight is good and judgment is appropriate. Mood and affect normal.   Data Review:    Labs: Basic Metabolic Panel: Recent Labs  Lab 07/13/22 1508 07/13/22 1555  NA 137  --   K 2.7*  --   CL 94*  --   CO2 30  --   GLUCOSE 122*  --   BUN 32*  --   CREATININE 1.93*  --   CALCIUM 9.0  --   MG  --  2.1   Liver Function Tests: No results for input(s): "AST", "ALT", "ALKPHOS", "BILITOT", "PROT", "ALBUMIN" in the last 168 hours. No results for input(s): "LIPASE", "AMYLASE" in the last 168 hours. No results for input(s): "AMMONIA" in the last 168 hours. CBC: Recent Labs  Lab 07/13/22 1508  WBC 18.2*  HGB 12.9*  HCT 37.9*  MCV 94.0  PLT 288   Cardiac Enzymes: No results for input(s): "CKTOTAL", "CKMB", "CKMBINDEX", "TROPONINI" in the last 168 hours.  BNP (last 3 results) No results for input(s): "PROBNP" in the last 8760 hours. CBG: Recent Labs  Lab 07/13/22 1507  GLUCAP 111*    Urinalysis    Component Value Date/Time   COLORURINE YELLOW (A) 12/01/2017 1358   APPEARANCEUR Clear  03/30/2018 1856   LABSPEC 1.020 12/01/2017 1358   PHURINE 6.0 12/01/2017 1358   GLUCOSEU Negative  03/30/2018 1856   HGBUR SMALL (A) 12/01/2017 1358   BILIRUBINUR Negative 03/30/2018 1856   KETONESUR NEGATIVE 12/01/2017 1358   PROTEINUR 1+ (A) 03/30/2018 1856   PROTEINUR 30 (A) 12/01/2017 1358   NITRITE Negative 03/30/2018 1856   NITRITE NEGATIVE 12/01/2017 1358   LEUKOCYTESUR Negative 03/30/2018 1856      Radiographic Studies: CT Angio Chest PE W and/or Wo Contrast  Result Date: 07/13/2022 CLINICAL DATA:  Flu-like symptoms. EXAM: CT ANGIOGRAPHY CHEST WITH CONTRAST TECHNIQUE: Multidetector CT imaging of the chest was performed using the standard protocol during bolus administration of intravenous contrast. Multiplanar CT image reconstructions and MIPs were obtained to evaluate the vascular anatomy. RADIATION DOSE REDUCTION: This exam was performed according to the departmental dose-optimization program which includes automated exposure control, adjustment of the mA and/or kV according to patient size and/or use of iterative reconstruction technique. CONTRAST:  43m OMNIPAQUE IOHEXOL 350 MG/ML SOLN COMPARISON:  None Available. FINDINGS: Cardiovascular: Satisfactory opacification of the pulmonary arteries to the segmental level. No evidence of pulmonary embolism. Heart is mildly enlarged. No pericardial effusion. Mediastinum/Nodes: There are numerous nonenlarged and mildly enlarged paratracheal lymph nodes measuring up to 11 mm. There is an enlarged AP window lymph node measuring 15 mm. There is an enlarged subcarinal lymph node measuring 16 mm. There are nonenlarged and enlarged right hilar lymph nodes measuring 13 mm. Visualized esophagus and thyroid gland are within normal limits. Lungs/Pleura: Patchy airspace and ground-glass opacities are seen throughout the inferior right lower lobe and minimally in the right middle lobe. There is diffuse peribronchial wall thickening bilaterally most significant in the right middle lobe and right lower lobe. There is some peripheral mucous plugging in the  right lower lobe. No pneumothorax. No pleural effusion. Upper Abdomen: No acute abnormality. Musculoskeletal: Degenerative changes affect the spine. Review of the MIP images confirms the above findings. IMPRESSION: 1. No evidence for pulmonary embolism. 2. Patchy airspace and ground-glass opacities in the right lower lobe and right middle lobe worrisome for pneumonia. 3. Diffuse peribronchial wall thickening compatible with bronchitis. 4. Peripheral mucous plugging in the right lower lobe, aspiration is a possibility. 5. Mediastinal and right hilar lymphadenopathy, likely reactive. 6. Mild cardiomegaly. Electronically Signed   By: ARonney AstersM.D.   On: 07/13/2022 16:28   DG Chest 2 View  Result Date: 07/13/2022 CLINICAL DATA:  Chest pain and productive cough. EXAM: CHEST - 2 VIEW COMPARISON:  Chest radiograph dated 06/15/2021. FINDINGS: The heart size and mediastinal contours are within normal limits. Both lungs are clear. Degenerative changes are seen in the spine. A loop recorder device overlies the left chest. IMPRESSION: No active cardiopulmonary disease. Electronically Signed   By: TZerita BoersM.D.   On: 07/13/2022 15:34    EKG: Independently reviewed by me showed normal sinus rhythm, LAFB.    Assessment/Plan:   Principal Problem:   Sepsis ([place] Active Problems:   CAP (community acquired pneumonia)   Acute respiratory failure with hypoxia ([place]   Hypertension associated with diabetes ([place]   CKD (chronic kidney disease) stage 3, GFR 30-59 ml/min (HCC)   Sepsis secondary to community-acquired pneumonia: Admit to MedSurg and monitor on telemetry.  Treat with IV fluids, IV ceftriaxone and IV azithromycin.  Acute hypoxic respiratory failure: Continue 2 L/min oxygen via nasal cannula.  Hypokalemia: He was  given potassium chloride 10 mEq intravenously.  Add oral potassium chloride 40  mEq.    CKD stage IIIb: Creatinine is stable  Chronic diastolic CHF: Compensated  Other  comorbidities include type II DM, hypertension,    Other information:   DVT prophylaxis: enoxaparin (LOVENOX) injection 40 mg Start: 07/14/22 1000  Lovenox  Code Status: Full code. Family Communication: None  Disposition Plan: Plan to discharge home in 2 to 3 days Consults called: None Admission status: Inpatient  The medical decision making on this patient was of high complexity and the patient is at high risk for clinical deterioration, therefore this is a level 3 visit.      Jameah Rouser [facility] Pager: Please check [email or web address]   How to contact the [facility] Attending or Consulting provider [place] or covering provider during after hours [place], for this patient?   Check the care team in [facility] and look for a) attending/consulting [facility] provider listed and b) the [facility] team listed Log into [email or web address] and use [place]'s universal password to access. If you do not have the password, please contact the hospital operator. Locate the [facility] provider you are looking for under [facility] and page to a number that you can be directly reached. If you still have difficulty reaching the provider, please page the [facility] (Director on Call) for the Hospitalists listed on amion for assistance.  07/13/2022, 5:37 PM

## 2022-07-14 DIAGNOSIS — I152 Hypertension secondary to endocrine disorders: Secondary | ICD-10-CM

## 2022-07-14 DIAGNOSIS — E1159 Type 2 diabetes mellitus with other circulatory complications: Secondary | ICD-10-CM

## 2022-07-14 DIAGNOSIS — N1832 Chronic kidney disease, stage 3b: Secondary | ICD-10-CM | POA: Diagnosis not present

## 2022-07-14 DIAGNOSIS — J189 Pneumonia, unspecified organism: Secondary | ICD-10-CM | POA: Diagnosis not present

## 2022-07-14 DIAGNOSIS — A419 Sepsis, unspecified organism: Secondary | ICD-10-CM | POA: Diagnosis not present

## 2022-07-14 DIAGNOSIS — J9601 Acute respiratory failure with hypoxia: Secondary | ICD-10-CM | POA: Diagnosis not present

## 2022-07-14 LAB — CBC WITH DIFFERENTIAL/PLATELET
Abs Immature Granulocytes: 0.15 10*3/uL — ABNORMAL HIGH (ref 0.00–0.07)
Basophils Absolute: 0.1 10*3/uL (ref 0.0–0.1)
Basophils Relative: 0 %
Eosinophils Absolute: 0 10*3/uL (ref 0.0–0.5)
Eosinophils Relative: 0 %
HCT: 37.5 % — ABNORMAL LOW (ref 39.0–52.0)
Hemoglobin: 12.7 g/dL — ABNORMAL LOW (ref 13.0–17.0)
Immature Granulocytes: 1 %
Lymphocytes Relative: 14 %
Lymphs Abs: 2.4 10*3/uL (ref 0.7–4.0)
MCH: 32.2 pg (ref 26.0–34.0)
MCHC: 33.9 g/dL (ref 30.0–36.0)
MCV: 95.2 fL (ref 80.0–100.0)
Monocytes Absolute: 1.4 10*3/uL — ABNORMAL HIGH (ref 0.1–1.0)
Monocytes Relative: 8 %
Neutro Abs: 13.3 10*3/uL — ABNORMAL HIGH (ref 1.7–7.7)
Neutrophils Relative %: 77 %
Platelets: 253 10*3/uL (ref 150–400)
RBC: 3.94 MIL/uL — ABNORMAL LOW (ref 4.22–5.81)
RDW: 13.8 % (ref 11.5–15.5)
WBC: 17.3 10*3/uL — ABNORMAL HIGH (ref 4.0–10.5)
nRBC: 0 % (ref 0.0–0.2)

## 2022-07-14 LAB — MAGNESIUM: Magnesium: 2 mg/dL (ref 1.7–2.4)

## 2022-07-14 LAB — BASIC METABOLIC PANEL
Anion gap: 13 (ref 5–15)
BUN: 32 mg/dL — ABNORMAL HIGH (ref 8–23)
CO2: 32 mmol/L (ref 22–32)
Calcium: 8.9 mg/dL (ref 8.9–10.3)
Chloride: 96 mmol/L — ABNORMAL LOW (ref 98–111)
Creatinine, Ser: 1.87 mg/dL — ABNORMAL HIGH (ref 0.61–1.24)
GFR, Estimated: 39 mL/min — ABNORMAL LOW (ref >60)
Glucose, Bld: 127 mg/dL — ABNORMAL HIGH (ref 70–99)
Potassium: 2.5 mmol/L — CL (ref 3.5–5.1)
Sodium: 141 mmol/L (ref 135–145)

## 2022-07-14 MED ORDER — CLONIDINE HCL 0.1 MG PO TABS
0.3000 mg | ORAL_TABLET | Freq: Two times a day (BID) | ORAL | Status: DC
  Administered 2022-07-14 – 2022-07-17 (×7): 0.3 mg via ORAL
  Filled 2022-07-14 (×7): qty 3

## 2022-07-14 MED ORDER — ALLOPURINOL 100 MG PO TABS
150.0000 mg | ORAL_TABLET | Freq: Every day | ORAL | Status: DC
  Administered 2022-07-14 – 2022-07-17 (×4): 150 mg via ORAL
  Filled 2022-07-14 (×4): qty 2

## 2022-07-14 MED ORDER — POLYETHYLENE GLYCOL 3350 17 G PO PACK: 17.0000 g | PACK | Freq: Every day | ORAL | Status: DC | PRN

## 2022-07-14 MED ORDER — ATORVASTATIN CALCIUM 20 MG PO TABS
40.0000 mg | ORAL_TABLET | Freq: Every evening | ORAL | Status: DC
  Administered 2022-07-14 – 2022-07-16 (×3): 40 mg via ORAL
  Filled 2022-07-14 (×3): qty 2

## 2022-07-14 MED ORDER — DULOXETINE HCL 20 MG PO CPEP
40.0000 mg | ORAL_CAPSULE | Freq: Every day | ORAL | Status: DC
  Administered 2022-07-14 – 2022-07-17 (×4): 40 mg via ORAL
  Filled 2022-07-14 (×5): qty 2

## 2022-07-14 MED ORDER — MELATONIN 3 MG PO TABS: 3.0000 mg | ORAL_TABLET | Freq: Once | ORAL | Status: DC

## 2022-07-14 MED ORDER — BUSPIRONE HCL 10 MG PO TABS
15.0000 mg | ORAL_TABLET | Freq: Two times a day (BID) | ORAL | Status: DC
  Administered 2022-07-14 – 2022-07-17 (×7): 15 mg via ORAL
  Filled 2022-07-14 (×7): qty 2

## 2022-07-14 MED ORDER — POTASSIUM CHLORIDE 10 MEQ/100ML IV SOLN
10.0000 meq | INTRAVENOUS | Status: AC
  Administered 2022-07-14 (×4): 10 meq via INTRAVENOUS
  Filled 2022-07-14 (×4): qty 100

## 2022-07-14 MED ORDER — HYDRALAZINE HCL 50 MG PO TABS
50.0000 mg | ORAL_TABLET | Freq: Two times a day (BID) | ORAL | Status: DC
  Administered 2022-07-14 – 2022-07-17 (×7): 50 mg via ORAL
  Filled 2022-07-14 (×7): qty 1

## 2022-07-14 MED ORDER — HYDROCOD POLI-CHLORPHE POLI ER 10-8 MG/5ML PO SUER
5.0000 mL | Freq: Two times a day (BID) | ORAL | Status: DC | PRN
  Administered 2022-07-14 – 2022-07-17 (×6): 5 mL via ORAL
  Filled 2022-07-14 (×6): qty 5

## 2022-07-14 MED ORDER — METOPROLOL TARTRATE 25 MG PO TABS
25.0000 mg | ORAL_TABLET | Freq: Two times a day (BID) | ORAL | Status: DC
  Administered 2022-07-14 – 2022-07-17 (×7): 25 mg via ORAL
  Filled 2022-07-14 (×7): qty 1

## 2022-07-14 MED ORDER — MELATONIN 5 MG PO TABS
10.0000 mg | ORAL_TABLET | Freq: Once | ORAL | Status: AC
  Administered 2022-07-14: 10 mg via ORAL
  Filled 2022-07-14 (×2): qty 2

## 2022-07-14 MED ORDER — BENZONATATE 100 MG PO CAPS
200.0000 mg | ORAL_CAPSULE | Freq: Three times a day (TID) | ORAL | Status: DC | PRN
  Administered 2022-07-14 (×2): 200 mg via ORAL
  Filled 2022-07-14 (×2): qty 2

## 2022-07-14 MED ORDER — LOSARTAN POTASSIUM-HCTZ 100-25 MG PO TABS: 1.0000 | ORAL_TABLET | Freq: Every day | ORAL | Status: DC

## 2022-07-14 MED ORDER — GABAPENTIN 300 MG PO CAPS
600.0000 mg | ORAL_CAPSULE | Freq: Two times a day (BID) | ORAL | Status: DC
  Administered 2022-07-14 – 2022-07-17 (×7): 600 mg via ORAL
  Filled 2022-07-14 (×7): qty 2

## 2022-07-14 MED ORDER — VITAMIN D 25 MCG (1000 UNIT) PO TABS
1000.0000 [IU] | ORAL_TABLET | Freq: Every day | ORAL | Status: DC
  Administered 2022-07-14 – 2022-07-17 (×4): 1000 [IU] via ORAL
  Filled 2022-07-14 (×4): qty 1

## 2022-07-14 MED ORDER — HYDROCHLOROTHIAZIDE 25 MG PO TABS
25.0000 mg | ORAL_TABLET | Freq: Every day | ORAL | Status: DC
  Administered 2022-07-14 – 2022-07-16 (×3): 25 mg via ORAL
  Filled 2022-07-14 (×3): qty 1

## 2022-07-14 MED ORDER — LOSARTAN POTASSIUM 50 MG PO TABS
100.0000 mg | ORAL_TABLET | Freq: Every day | ORAL | Status: DC
  Administered 2022-07-14 – 2022-07-17 (×4): 100 mg via ORAL
  Filled 2022-07-14 (×4): qty 2

## 2022-07-14 MED ORDER — BISACODYL 5 MG PO TBEC
5.0000 mg | DELAYED_RELEASE_TABLET | Freq: Once | ORAL | Status: AC
  Administered 2022-07-14: 5 mg via ORAL
  Filled 2022-07-14: qty 1

## 2022-07-14 MED ORDER — POTASSIUM CHLORIDE CRYS ER 20 MEQ PO TBCR
40.0000 meq | EXTENDED_RELEASE_TABLET | Freq: Four times a day (QID) | ORAL | Status: AC
  Administered 2022-07-14 (×2): 40 meq via ORAL
  Filled 2022-07-14 (×2): qty 2

## 2022-07-14 NOTE — Progress Notes (Signed)
Progress Note    Steve Buck.  NFA:[id number] DOB: Dec 20, 1955  DOA: 07/13/2022 PCP: Kirk Ruths, MD      Brief Narrative:    Medical records reviewed and are as summarized below:  Steve Buck. is a 66 y.o. male  with medical history significant for chronic diastolic CHF, type II DM hypertension, obesity, who presented to the hospital with cough productive of greenish sputum, chest pain from coughing, shortness of breath, sore throat and fever.  Symptoms started about 4 days ago.  He had been taking ibuprofen for pain and fever.  He noticed that his symptoms were worsening.  His oxygen saturation was 83% last.  He was able to get oxygen saturation up into the 90s after taking deep breaths.  He went to a walk-in clinic this afternoon and he was referred to the emergency room for further management.  Of note, he said he had flulike symptoms (malaise, cough and runny nose, about a week prior to the development of his current symptoms.  However those symptoms lasted a few days and he was well until about 4 days ago when he felt sick again.         Assessment/Plan:   Principal Problem:   Sepsis ([place]) Active Problems:   CAP (community acquired pneumonia)   Acute respiratory failure with hypoxia ([place])   Hypertension associated with diabetes ([place])   CKD (chronic kidney disease) stage 3, GFR 30-59 ml/min ([facility])   Body mass index is 36.92 kg/m.  (Obesity)   Sepsis secondary to community-acquired pneumonia, leukocytosis: Continue IV ceftriaxone and azithromycin.  Follow-up blood cultures.  Repeat CBC tomorrow. Tussionex cough syrup as needed.   Acute hypoxic respiratory failure: Improved.  He tolerates room air intermittently.  Taper off oxygen completely as able.    Persistent hypokalemia: Replete potassium intravenously and orally.  Repeat BMP tomorrow   CKD stage IIIb: Creatinine is stable   Chronic diastolic CHF: Compensated   Other  comorbidities include type II DM, hypertension, neuropathy    Diet Order             Diet heart healthy/carb modified Room service appropriate? Yes; Fluid consistency: Thin  Diet effective now                            Consultants: None  Procedures: None    Medications:    allopurinol  150 mg Oral Daily   atorvastatin  40 mg Oral QPM   busPIRone  15 mg Oral BID   cholecalciferol  1,000 Units Oral Daily   cloNIDine  0.3 mg Oral BID   DULoxetine  40 mg Oral Daily   enoxaparin (LOVENOX) injection  0.5 mg/kg Subcutaneous Q24H   gabapentin  600 mg Oral BID   hydrALAZINE  50 mg Oral BID   losartan  100 mg Oral Daily   And   hydrochlorothiazide  25 mg Oral Daily   metoprolol tartrate  25 mg Oral BID   potassium chloride  40 mEq Oral Q6H   Continuous Infusions:  azithromycin Stopped (07/13/22 1701)   cefTRIAXone (ROCEPHIN)  IV Stopped (07/13/22 1643)   potassium chloride       Anti-infectives (From admission, onward)    Start     Dose/Rate Route Frequency Ordered Stop   07/14/22 1600  azithromycin (ZITHROMAX) 500 mg in sodium chloride 0.9 % 250 mL IVPB  Status:  Discontinued  500 mg 250 mL/hr over 60 Minutes Intravenous Every 24 hours 07/13/22 1735 07/13/22 1740   07/14/22 1500  cefTRIAXone (ROCEPHIN) 2 g in sodium chloride 0.9 % 100 mL IVPB  Status:  Discontinued        2 g 200 mL/hr over 30 Minutes Intravenous Every 24 hours 07/13/22 1735 07/13/22 1740   07/13/22 1545  cefTRIAXone (ROCEPHIN) 2 g in sodium chloride 0.9 % 100 mL IVPB        2 g 200 mL/hr over 30 Minutes Intravenous Every 24 hours 07/13/22 1538 07/18/22 1544   07/13/22 1545  azithromycin (ZITHROMAX) 500 mg in sodium chloride 0.9 % 250 mL IVPB        500 mg 250 mL/hr over 60 Minutes Intravenous Every 24 hours 07/13/22 1538 07/18/22 1544              Family Communication/Anticipated D/C date and plan/Code Status   DVT prophylaxis:      Code Status: Full Code  Family  Communication: None Disposition Plan: Plan to discharge home in 2 to 3 days   Status is: Inpatient Remains inpatient appropriate because: IV antibiotics for pneumonia       Subjective:   Interval events noted.  He said he had a rough night.  He complains of cough and neuropathic pain in the legs.  He said Robitussin did not help the cough and he requested Tussionex cough syrup.  He feels short of breath with little activity.  Objective:    Vitals:   07/13/22 1802 07/13/22 2017 07/14/22 0303 07/14/22 0747  BP: (!) 153/70 139/86 (!) 163/92 (!) 161/90  Pulse: 89 98 80 76  Resp: '18 16 16 18  '$ Temp: 99.4 F (37.4 C) 98.9 F (37.2 C) 98.4 F (36.9 C) 98 F (36.7 C)  TempSrc: Oral   Oral  SpO2: 92% 94% 96% 91%  Weight:      Height:       No data found.   Intake/Output Summary (Last 24 hours) at 07/14/2022 0859 Last data filed at 07/14/2022 0600 Gross per 24 hour  Intake 1726.98 ml  Output 500 ml  Net 1226.98 ml   Filed Weights   07/13/22 1713  Weight: 126.9 kg    Exam:  GEN: NAD SKIN: Warm and dry EYES: No pallor or icterus ENT: MMM CV: RRR PULM: Right basilar rales ABD: soft, obese, NT, +BS CNS: AAO x 3, non focal EXT: No edema or tenderness        Data Reviewed:   I have personally reviewed following labs and imaging studies:  Labs: Labs show the following:   Basic Metabolic Panel: Recent Labs  Lab 07/13/22 1508 07/13/22 1555 07/14/22 0549  NA 137  --  141  K 2.7*  --  2.5*  CL 94*  --  96*  CO2 30  --  32  GLUCOSE 122*  --  127*  BUN 32*  --  32*  CREATININE 1.93*  --  1.87*  CALCIUM 9.0  --  8.9  MG  --  2.1 2.0   GFR Estimated Creatinine Clearance: 52.8 mL/min (A) (by C-G formula based on SCr of 1.87 mg/dL (H)). Liver Function Tests: No results for input(s): "AST", "ALT", "ALKPHOS", "BILITOT", "PROT", "ALBUMIN" in the last 168 hours. No results for input(s): "LIPASE", "AMYLASE" in the last 168 hours. No results for input(s):  "AMMONIA" in the last 168 hours. Coagulation profile No results for input(s): "INR", "PROTIME" in the last 168 hours.  CBC: Recent  Labs  Lab 07/13/22 1508 07/14/22 0549  WBC 18.2* 17.3*  NEUTROABS  --  13.3*  HGB 12.9* 12.7*  HCT 37.9* 37.5*  MCV 94.0 95.2  PLT 288 253   Cardiac Enzymes: No results for input(s): "CKTOTAL", "CKMB", "CKMBINDEX", "TROPONINI" in the last 168 hours. BNP (last 3 results) No results for input(s): "PROBNP" in the last 8760 hours. CBG: Recent Labs  Lab 07/13/22 1507 07/13/22 2007  GLUCAP 111* 204*   D-Dimer: No results for input(s): "DDIMER" in the last 72 hours. Hgb A1c: No results for input(s): "HGBA1C" in the last 72 hours. Lipid Profile: No results for input(s): "CHOL", "HDL", "LDLCALC", "TRIG", "CHOLHDL", "LDLDIRECT" in the last 72 hours. Thyroid function studies: No results for input(s): "TSH", "T4TOTAL", "T3FREE", "THYROIDAB" in the last 72 hours.  Invalid input(s): "FREET3" Anemia work up: No results for input(s): "VITAMINB12", "FOLATE", "FERRITIN", "TIBC", "IRON", "RETICCTPCT" in the last 72 hours. Sepsis Labs: Recent Labs  Lab 07/13/22 1508 07/13/22 1541 07/13/22 1742 07/14/22 0549  WBC 18.2*  --   --  17.3*  LATICACIDVEN  --  1.2 1.4  --     Microbiology Recent Results (from the past 240 hour(s))  Resp Panel by RT-PCR (Flu A&B, Covid) Anterior Nasal Swab     Status: None   Collection Time: 07/13/22  3:41 PM   Specimen: Anterior Nasal Swab  Result Value Ref Range Status   SARS Coronavirus 2 by RT PCR NEGATIVE NEGATIVE Final    Comment: (NOTE) SARS-CoV-2 target nucleic acids are NOT DETECTED.  The SARS-CoV-2 RNA is generally detectable in upper respiratory specimens during the acute phase of infection. The lowest concentration of SARS-CoV-2 viral copies this assay can detect is 138 copies/mL. A negative result does not preclude SARS-Cov-2 infection and should not be used as the sole basis for treatment or other patient  management decisions. A negative result may occur with  improper specimen collection/handling, submission of specimen other than nasopharyngeal swab, presence of viral mutation(s) within the areas targeted by this assay, and inadequate number of viral copies(<138 copies/mL). A negative result must be combined with clinical observations, patient history, and epidemiological information. The expected result is Negative.  Fact Sheet for Patients:  EntrepreneurPulse.com.au  Fact Sheet for Healthcare Providers:  IncredibleEmployment.be  This test is no t yet approved or cleared by the [place] FDA and  has been authorized for detection and/or diagnosis of SARS-CoV-2 by FDA under an Emergency Use Authorization (EUA). This EUA will remain  in effect (meaning this test can be used) for the duration of the COVID-19 declaration under Section 564(b)(1) of the Act, 21 U.S.C.section 360bbb-3(b)(1), unless the authorization is terminated  or revoked sooner.       Influenza A by PCR NEGATIVE NEGATIVE Final   Influenza B by PCR NEGATIVE NEGATIVE Final    Comment: (NOTE) The Xpert Xpress SARS-CoV-2/FLU/RSV plus assay is intended as an aid in the diagnosis of influenza from Nasopharyngeal swab specimens and should not be used as a sole basis for treatment. Nasal washings and aspirates are unacceptable for Xpert Xpress SARS-CoV-2/FLU/RSV testing.  Fact Sheet for Patients: EntrepreneurPulse.com.au  Fact Sheet for Healthcare Providers: IncredibleEmployment.be  This test is not yet approved or cleared by the [place] FDA and has been authorized for detection and/or diagnosis of SARS-CoV-2 by FDA under an Emergency Use Authorization (EUA). This EUA will remain in effect (meaning this test can be used) for the duration of the COVID-19 declaration under Section 564(b)(1) of the Act, 21 U.S.C.  section 360bbb-3(b)(1),  unless the authorization is terminated or revoked.  Performed at [facility], [address] [place], [place] [address]   Blood Culture (routine x 2)     Status: None (Preliminary result)   Collection Time: 07/13/22  3:41 PM   Specimen: BLOOD  Result Value Ref Range Status   Specimen Description BLOOD BLOOD LEFT FOREARM  Final   Special Requests   Final    BOTTLES DRAWN AEROBIC AND ANAEROBIC Blood Culture results may not be optimal due to an excessive volume of blood received in culture bottles   Culture   Final    NO GROWTH < 24 HOURS Performed at [facility], [address], [place], [place] [address]    Report Status PENDING  Incomplete  Blood Culture (routine x 2)     Status: None (Preliminary result)   Collection Time: 07/13/22  3:41 PM   Specimen: BLOOD  Result Value Ref Range Status   Specimen Description BLOOD LEFT ANTECUBITAL  Final   Special Requests   Final    BOTTLES DRAWN AEROBIC AND ANAEROBIC Blood Culture adequate volume   Culture   Final    NO GROWTH < 24 HOURS Performed at [facility], [address], [place], [place] [address]    Report Status PENDING  Incomplete    Procedures and diagnostic studies:  CT Angio Chest PE W and/or Wo Contrast  Result Date: 07/13/2022 CLINICAL DATA:  Flu-like symptoms. EXAM: CT ANGIOGRAPHY CHEST WITH CONTRAST TECHNIQUE: Multidetector CT imaging of the chest was performed using the standard protocol during bolus administration of intravenous contrast. Multiplanar CT image reconstructions and MIPs were obtained to evaluate the vascular anatomy. RADIATION DOSE REDUCTION: This exam was performed according to the departmental dose-optimization program which includes automated exposure control, adjustment of the mA and/or kV according to patient size and/or use of iterative reconstruction technique. CONTRAST:  27m OMNIPAQUE IOHEXOL 350 MG/ML SOLN COMPARISON:  None Available. FINDINGS: Cardiovascular:  Satisfactory opacification of the pulmonary arteries to the segmental level. No evidence of pulmonary embolism. Heart is mildly enlarged. No pericardial effusion. Mediastinum/Nodes: There are numerous nonenlarged and mildly enlarged paratracheal lymph nodes measuring up to 11 mm. There is an enlarged AP window lymph node measuring 15 mm. There is an enlarged subcarinal lymph node measuring 16 mm. There are nonenlarged and enlarged right hilar lymph nodes measuring 13 mm. Visualized esophagus and thyroid gland are within normal limits. Lungs/Pleura: Patchy airspace and ground-glass opacities are seen throughout the inferior right lower lobe and minimally in the right middle lobe. There is diffuse peribronchial wall thickening bilaterally most significant in the right middle lobe and right lower lobe. There is some peripheral mucous plugging in the right lower lobe. No pneumothorax. No pleural effusion. Upper Abdomen: No acute abnormality. Musculoskeletal: Degenerative changes affect the spine. Review of the MIP images confirms the above findings. IMPRESSION: 1. No evidence for pulmonary embolism. 2. Patchy airspace and ground-glass opacities in the right lower lobe and right middle lobe worrisome for pneumonia. 3. Diffuse peribronchial wall thickening compatible with bronchitis. 4. Peripheral mucous plugging in the right lower lobe, aspiration is a possibility. 5. Mediastinal and right hilar lymphadenopathy, likely reactive. 6. Mild cardiomegaly. Electronically Signed   By: ARonney AstersM.D.   On: 07/13/2022 16:28   DG Chest 2 View  Result Date: 07/13/2022 CLINICAL DATA:  Chest pain and productive cough. EXAM: CHEST - 2 VIEW COMPARISON:  Chest radiograph dated 06/15/2021. FINDINGS: The heart size and mediastinal  contours are within normal limits. Both lungs are clear. Degenerative changes are seen in the spine. A loop recorder device overlies the left chest. IMPRESSION: No active cardiopulmonary disease.  Electronically Signed   By: Zerita Boers M.D.   On: 07/13/2022 15:34               LOS: 1 day   Daneli Butkiewicz  [facility]   Pager on [email or web address]. If 7PM-7AM, please contact night-coverage at [email or web address]     07/14/2022, 8:59 AM

## 2022-07-14 NOTE — Progress Notes (Signed)
       CROSS COVER NOTE  NAME: Steve Buck. MRN: [id number] DOB : [birth year] ATTENDING PHYSICIAN: Jennye Boroughs, MD    Date of Service   07/14/2022   HPI/Events of Note   Notified of critical K --> 2.5  Interventions   Assessment/Plan:  40 mEQ IV K 40 mEQ PO K x2      This document was prepared using Dragon voice recognition software and may include unintentional dictation errors.  Neomia Glass DNP, MBA, FNP-BC Nurse Practitioner [facility] Pager [phone]

## 2022-07-14 NOTE — Progress Notes (Signed)
       CROSS COVER NOTE  NAME: Steve Buck. MRN: [id number] DOB : [birth year] ATTENDING PHYSICIAN: Jennye Boroughs, MD    Date of Service   07/14/2022   HPI/Events of Note   Medication request received for home Melatonin and patient report of cough refractory to robitussin.  Interventions   Assessment/Plan:  Tessalon pearls Melatonin     This document was prepared using Systems analyst and may include unintentional dictation errors.  Neomia Glass DNP, MBA, FNP-BC Nurse Practitioner [facility] Pager [phone]

## 2022-07-15 DIAGNOSIS — J189 Pneumonia, unspecified organism: Secondary | ICD-10-CM | POA: Diagnosis not present

## 2022-07-15 DIAGNOSIS — A419 Sepsis, unspecified organism: Secondary | ICD-10-CM | POA: Diagnosis not present

## 2022-07-15 DIAGNOSIS — E876 Hypokalemia: Secondary | ICD-10-CM | POA: Diagnosis present

## 2022-07-15 DIAGNOSIS — J9601 Acute respiratory failure with hypoxia: Secondary | ICD-10-CM | POA: Diagnosis not present

## 2022-07-15 DIAGNOSIS — R6520 Severe sepsis without septic shock: Secondary | ICD-10-CM | POA: Diagnosis not present

## 2022-07-15 LAB — MAGNESIUM: Magnesium: 2.1 mg/dL (ref 1.7–2.4)

## 2022-07-15 LAB — CBC WITH DIFFERENTIAL/PLATELET
Abs Immature Granulocytes: 0.12 10*3/uL — ABNORMAL HIGH (ref 0.00–0.07)
Basophils Absolute: 0.1 10*3/uL (ref 0.0–0.1)
Basophils Relative: 1 %
Eosinophils Absolute: 0.2 10*3/uL (ref 0.0–0.5)
Eosinophils Relative: 2 %
HCT: 36.2 % — ABNORMAL LOW (ref 39.0–52.0)
Hemoglobin: 12.3 g/dL — ABNORMAL LOW (ref 13.0–17.0)
Immature Granulocytes: 1 %
Lymphocytes Relative: 20 %
Lymphs Abs: 2.4 10*3/uL (ref 0.7–4.0)
MCH: 32.5 pg (ref 26.0–34.0)
MCHC: 34 g/dL (ref 30.0–36.0)
MCV: 95.8 fL (ref 80.0–100.0)
Monocytes Absolute: 1.1 10*3/uL — ABNORMAL HIGH (ref 0.1–1.0)
Monocytes Relative: 9 %
Neutro Abs: 8.6 10*3/uL — ABNORMAL HIGH (ref 1.7–7.7)
Neutrophils Relative %: 67 %
Platelets: 273 10*3/uL (ref 150–400)
RBC: 3.78 MIL/uL — ABNORMAL LOW (ref 4.22–5.81)
RDW: 13.8 % (ref 11.5–15.5)
WBC: 12.5 10*3/uL — ABNORMAL HIGH (ref 4.0–10.5)
nRBC: 0 % (ref 0.0–0.2)

## 2022-07-15 LAB — BASIC METABOLIC PANEL
Anion gap: 11 (ref 5–15)
BUN: 32 mg/dL — ABNORMAL HIGH (ref 8–23)
CO2: 31 mmol/L (ref 22–32)
Calcium: 8.7 mg/dL — ABNORMAL LOW (ref 8.9–10.3)
Chloride: 99 mmol/L (ref 98–111)
Creatinine, Ser: 1.57 mg/dL — ABNORMAL HIGH (ref 0.61–1.24)
GFR, Estimated: 48 mL/min — ABNORMAL LOW (ref >60)
Glucose, Bld: 118 mg/dL — ABNORMAL HIGH (ref 70–99)
Potassium: 2.8 mmol/L — ABNORMAL LOW (ref 3.5–5.1)
Sodium: 141 mmol/L (ref 135–145)

## 2022-07-15 MED ORDER — GUAIFENESIN-DM 100-10 MG/5ML PO SYRP
5.0000 mL | ORAL_SOLUTION | ORAL | Status: DC | PRN
  Administered 2022-07-15 – 2022-07-17 (×2): 5 mL via ORAL
  Filled 2022-07-15 (×2): qty 10

## 2022-07-15 MED ORDER — POTASSIUM CHLORIDE CRYS ER 20 MEQ PO TBCR
40.0000 meq | EXTENDED_RELEASE_TABLET | Freq: Four times a day (QID) | ORAL | Status: AC
  Administered 2022-07-15 (×3): 40 meq via ORAL
  Filled 2022-07-15 (×3): qty 2

## 2022-07-15 NOTE — TOC CM/SW Note (Signed)
  Transition of Care [facility]) Screening Note   Patient Details  Name: Steve Buck. Date of Birth: [birth year]   Transition of Care [facility]) CM/SW Contact:    Candie Chroman, LCSW Phone Number: 07/15/2022, 8:42 AM    Transition of Care Department [facility]) has reviewed patient and no TOC needs have been identified at this time. We will continue to monitor patient advancement through interdisciplinary progression rounds. If new patient transition needs arise, please place a TOC consult.

## 2022-07-15 NOTE — Progress Notes (Addendum)
Progress Note    Almin M Dineen Kid.  TTS:[id number] DOB: Sep 28, 1955  DOA: 07/13/2022 PCP: Kirk Ruths, MD      Brief Narrative:    Medical records reviewed and are as summarized below:  [name] is a 66 y.o. male  with medical history significant for chronic diastolic CHF, type II DM hypertension, obesity, who presented to the hospital with cough productive of greenish sputum, chest pain from coughing, shortness of breath, sore throat and fever.  Symptoms started about 4 days ago.  He had been taking ibuprofen for pain and fever.  He noticed that his symptoms were worsening.  His oxygen saturation was 83% last.  He was able to get oxygen saturation up into the 90s after taking deep breaths.  He went to a walk-in clinic this afternoon and he was referred to the emergency room for further management.  Of note, he said he had flulike symptoms (malaise, cough and runny nose, about a week prior to the development of his current symptoms.  However those symptoms lasted a few days and he was well until about 4 days ago when he felt sick again.         Assessment/Plan:   Principal Problem:   Sepsis ([place]) Active Problems:   CAP (community acquired pneumonia)   Acute respiratory failure with hypoxia ([place])   Hypertension associated with diabetes ([place])   CKD (chronic kidney disease) stage 3, GFR 30-59 ml/min (HCC)   Hypokalemia   Body mass index is 36.92 kg/m.  (Obesity)   Sepsis secondary to community-acquired pneumonia, leukocytosis: Leukocytosis is improving.  Continue IV ceftriaxone and azithromycin.  No growth on blood cultures thus far.  Continue Tussionex cough syrup as needed.  Repeat CBC tomorrow   Acute hypoxic respiratory failure: Improved  Persistent hypokalemia: Replete potassium orally with potassium chloride 40 mEq 4 hours x 3 doses.  Repeat BMP tomorrow   CKD stage IIIb: Creatinine is stable   Chronic diastolic CHF: Compensated    Other comorbidities include type II DM, hypertension, neuropathy    Diet Order             Diet heart healthy/carb modified Room service appropriate? Yes; Fluid consistency: Thin  Diet effective now                            Consultants: None  Procedures: None    Medications:    allopurinol  150 mg Oral Daily   atorvastatin  40 mg Oral QPM   busPIRone  15 mg Oral BID   cholecalciferol  1,000 Units Oral Daily   cloNIDine  0.3 mg Oral BID   DULoxetine  40 mg Oral Daily   enoxaparin (LOVENOX) injection  0.5 mg/kg Subcutaneous Q24H   gabapentin  600 mg Oral BID   hydrALAZINE  50 mg Oral BID   losartan  100 mg Oral Daily   And   hydrochlorothiazide  25 mg Oral Daily   metoprolol tartrate  25 mg Oral BID   potassium chloride  40 mEq Oral Q6H   Continuous Infusions:  azithromycin 500 mg (07/15/22 1602)   cefTRIAXone (ROCEPHIN)  IV 2 g (07/15/22 1519)     Anti-infectives (From admission, onward)    Start     Dose/Rate Route Frequency Ordered Stop   07/14/22 1600  azithromycin (ZITHROMAX) 500 mg in sodium chloride 0.9 % 250 mL IVPB  Status:  Discontinued  500 mg 250 mL/hr over 60 Minutes Intravenous Every 24 hours 07/13/22 1735 07/13/22 1740   07/14/22 1500  cefTRIAXone (ROCEPHIN) 2 g in sodium chloride 0.9 % 100 mL IVPB  Status:  Discontinued        2 g 200 mL/hr over 30 Minutes Intravenous Every 24 hours 07/13/22 1735 07/13/22 1740   07/13/22 1545  cefTRIAXone (ROCEPHIN) 2 g in sodium chloride 0.9 % 100 mL IVPB        2 g 200 mL/hr over 30 Minutes Intravenous Every 24 hours 07/13/22 1538 07/18/22 1544   07/13/22 1545  azithromycin (ZITHROMAX) 500 mg in sodium chloride 0.9 % 250 mL IVPB        500 mg 250 mL/hr over 60 Minutes Intravenous Every 24 hours 07/13/22 1538 07/18/22 1544              Family Communication/Anticipated D/C date and plan/Code Status   DVT prophylaxis:      Code Status: Full Code  Family Communication:  None Disposition Plan: Plan to discharge home tomorrow   Status is: Inpatient Remains inpatient appropriate because: IV antibiotics for pneumonia       Subjective:   Interval events noted.  He complains of cough but he said he had a better night because the Tussionex cough syrup helped.  He feels short of breath with activity but overall he is feeling better.  Objective:    Vitals:   07/14/22 2045 07/15/22 0502 07/15/22 0801 07/15/22 1559  BP: (!) 149/91 (!) 153/99 (!) 159/98 (!) 148/86  Pulse: 92 71 86 63  Resp: 18 20    Temp: 98.3 F (36.8 C) 98.6 F (37 C) 98.3 F (36.8 C) 98.2 F (36.8 C)  TempSrc: Oral Oral Oral Oral  SpO2: 96% 95% 91% 94%  Weight:      Height:       No data found.   Intake/Output Summary (Last 24 hours) at 07/15/2022 1725 Last data filed at 07/14/2022 2100 Gross per 24 hour  Intake 370 ml  Output --  Net 370 ml   Filed Weights   07/13/22 1713  Weight: 126.9 kg    Exam:  GEN: NAD SKIN: Warm and dry EYES: No pallor or icterus ENT: MMM CV: RRR PULM: Right basilar rhonchi and rales more prominent today ABD: soft, obese, NT, +BS CNS: AAO x 3, non focal EXT: No edema or tenderness        Data Reviewed:   I have personally reviewed following labs and imaging studies:  Labs: Labs show the following:   Basic Metabolic Panel: Recent Labs  Lab 07/13/22 1508 07/13/22 1555 07/14/22 0549 07/15/22 0544  NA 137  --  141 141  K 2.7*  --  2.5* 2.8*  CL 94*  --  96* 99  CO2 30  --  32 31  GLUCOSE 122*  --  127* 118*  BUN 32*  --  32* 32*  CREATININE 1.93*  --  1.87* 1.57*  CALCIUM 9.0  --  8.9 8.7*  MG  --  2.1 2.0 2.1   GFR Estimated Creatinine Clearance: 62.9 mL/min (A) (by C-G formula based on SCr of 1.57 mg/dL (H)). Liver Function Tests: No results for input(s): "AST", "ALT", "ALKPHOS", "BILITOT", "PROT", "ALBUMIN" in the last 168 hours. No results for input(s): "LIPASE", "AMYLASE" in the last 168 hours. No results  for input(s): "AMMONIA" in the last 168 hours. Coagulation profile No results for input(s): "INR", "PROTIME" in the last 168 hours.  CBC: Recent Labs  Lab 07/13/22 1508 07/14/22 0549 07/15/22 0544  WBC 18.2* 17.3* 12.5*  NEUTROABS  --  13.3* 8.6*  HGB 12.9* 12.7* 12.3*  HCT 37.9* 37.5* 36.2*  MCV 94.0 95.2 95.8  PLT 288 253 273   Cardiac Enzymes: No results for input(s): "CKTOTAL", "CKMB", "CKMBINDEX", "TROPONINI" in the last 168 hours. BNP (last 3 results) No results for input(s): "PROBNP" in the last 8760 hours. CBG: Recent Labs  Lab 07/13/22 1507 07/13/22 2007  GLUCAP 111* 204*   D-Dimer: No results for input(s): "DDIMER" in the last 72 hours. Hgb A1c: No results for input(s): "HGBA1C" in the last 72 hours. Lipid Profile: No results for input(s): "CHOL", "HDL", "LDLCALC", "TRIG", "CHOLHDL", "LDLDIRECT" in the last 72 hours. Thyroid function studies: No results for input(s): "TSH", "T4TOTAL", "T3FREE", "THYROIDAB" in the last 72 hours.  Invalid input(s): "FREET3" Anemia work up: No results for input(s): "VITAMINB12", "FOLATE", "FERRITIN", "TIBC", "IRON", "RETICCTPCT" in the last 72 hours. Sepsis Labs: Recent Labs  Lab 07/13/22 1508 07/13/22 1541 07/13/22 1742 07/14/22 0549 07/15/22 0544  WBC 18.2*  --   --  17.3* 12.5*  LATICACIDVEN  --  1.2 1.4  --   --     Microbiology Recent Results (from the past 240 hour(s))  Resp Panel by RT-PCR (Flu A&B, Covid) Anterior Nasal Swab     Status: None   Collection Time: 07/13/22  3:41 PM   Specimen: Anterior Nasal Swab  Result Value Ref Range Status   SARS Coronavirus 2 by RT PCR NEGATIVE NEGATIVE Final    Comment: (NOTE) SARS-CoV-2 target nucleic acids are NOT DETECTED.  The SARS-CoV-2 RNA is generally detectable in upper respiratory specimens during the acute phase of infection. The lowest concentration of SARS-CoV-2 viral copies this assay can detect is 138 copies/mL. A negative result does not preclude  SARS-Cov-2 infection and should not be used as the sole basis for treatment or other patient management decisions. A negative result may occur with  improper specimen collection/handling, submission of specimen other than nasopharyngeal swab, presence of viral mutation(s) within the areas targeted by this assay, and inadequate number of viral copies(<138 copies/mL). A negative result must be combined with clinical observations, patient history, and epidemiological information. The expected result is Negative.  Fact Sheet for Patients:  EntrepreneurPulse.com.au  Fact Sheet for Healthcare Providers:  IncredibleEmployment.be  This test is no t yet approved or cleared by the [place] FDA and  has been authorized for detection and/or diagnosis of SARS-CoV-2 by FDA under an Emergency Use Authorization (EUA). This EUA will remain  in effect (meaning this test can be used) for the duration of the COVID-19 declaration under Section 564(b)(1) of the Act, 21 U.S.C.section 360bbb-3(b)(1), unless the authorization is terminated  or revoked sooner.       Influenza A by PCR NEGATIVE NEGATIVE Final   Influenza B by PCR NEGATIVE NEGATIVE Final    Comment: (NOTE) The Xpert Xpress SARS-CoV-2/FLU/RSV plus assay is intended as an aid in the diagnosis of influenza from Nasopharyngeal swab specimens and should not be used as a sole basis for treatment. Nasal washings and aspirates are unacceptable for Xpert Xpress SARS-CoV-2/FLU/RSV testing.  Fact Sheet for Patients: EntrepreneurPulse.com.au  Fact Sheet for Healthcare Providers: IncredibleEmployment.be  This test is not yet approved or cleared by the [place] FDA and has been authorized for detection and/or diagnosis of SARS-CoV-2 by FDA under an Emergency Use Authorization (EUA). This EUA will remain in effect (meaning this test can be  used) for the duration of  the COVID-19 declaration under Section 564(b)(1) of the Act, 21 U.S.C. section 360bbb-3(b)(1), unless the authorization is terminated or revoked.  Performed at [facility], [address] [place], [place] [address]   Blood Culture (routine x 2)     Status: None (Preliminary result)   Collection Time: 07/13/22  3:41 PM   Specimen: BLOOD  Result Value Ref Range Status   Specimen Description BLOOD BLOOD LEFT FOREARM  Final   Special Requests   Final    BOTTLES DRAWN AEROBIC AND ANAEROBIC Blood Culture results may not be optimal due to an excessive volume of blood received in culture bottles   Culture   Final    NO GROWTH 2 DAYS Performed at [facility], [address], [place], [place] [address]    Report Status PENDING  Incomplete  Blood Culture (routine x 2)     Status: None (Preliminary result)   Collection Time: 07/13/22  3:41 PM   Specimen: BLOOD  Result Value Ref Range Status   Specimen Description BLOOD LEFT ANTECUBITAL  Final   Special Requests   Final    BOTTLES DRAWN AEROBIC AND ANAEROBIC Blood Culture adequate volume   Culture   Final    NO GROWTH 2 DAYS Performed at [facility], [address], [place], [place] [address]    Report Status PENDING  Incomplete    Procedures and diagnostic studies:  No results found.             LOS: 2 days   Beronica Lansdale  [facility]   Pager on [email or web address]. If 7PM-7AM, please contact night-coverage at [email or web address]     07/15/2022, 5:25 PM

## 2022-07-16 DIAGNOSIS — J9601 Acute respiratory failure with hypoxia: Secondary | ICD-10-CM | POA: Diagnosis not present

## 2022-07-16 DIAGNOSIS — E876 Hypokalemia: Secondary | ICD-10-CM | POA: Diagnosis not present

## 2022-07-16 DIAGNOSIS — A419 Sepsis, unspecified organism: Secondary | ICD-10-CM | POA: Diagnosis not present

## 2022-07-16 DIAGNOSIS — J189 Pneumonia, unspecified organism: Secondary | ICD-10-CM | POA: Diagnosis not present

## 2022-07-16 LAB — CBC WITH DIFFERENTIAL/PLATELET
Abs Immature Granulocytes: 0.14 10*3/uL — ABNORMAL HIGH (ref 0.00–0.07)
Basophils Absolute: 0.1 10*3/uL (ref 0.0–0.1)
Basophils Relative: 1 %
Eosinophils Absolute: 0.4 10*3/uL (ref 0.0–0.5)
Eosinophils Relative: 3 %
HCT: 36.5 % — ABNORMAL LOW (ref 39.0–52.0)
Hemoglobin: 12.3 g/dL — ABNORMAL LOW (ref 13.0–17.0)
Immature Granulocytes: 1 %
Lymphocytes Relative: 19 %
Lymphs Abs: 2.1 10*3/uL (ref 0.7–4.0)
MCH: 31.7 pg (ref 26.0–34.0)
MCHC: 33.7 g/dL (ref 30.0–36.0)
MCV: 94.1 fL (ref 80.0–100.0)
Monocytes Absolute: 1 10*3/uL (ref 0.1–1.0)
Monocytes Relative: 9 %
Neutro Abs: 7.5 10*3/uL (ref 1.7–7.7)
Neutrophils Relative %: 67 %
Platelets: 298 10*3/uL (ref 150–400)
RBC: 3.88 MIL/uL — ABNORMAL LOW (ref 4.22–5.81)
RDW: 13.5 % (ref 11.5–15.5)
WBC: 11.1 10*3/uL — ABNORMAL HIGH (ref 4.0–10.5)
nRBC: 0 % (ref 0.0–0.2)

## 2022-07-16 LAB — BASIC METABOLIC PANEL
Anion gap: 9 (ref 5–15)
BUN: 31 mg/dL — ABNORMAL HIGH (ref 8–23)
CO2: 31 mmol/L (ref 22–32)
Calcium: 8.9 mg/dL (ref 8.9–10.3)
Chloride: 100 mmol/L (ref 98–111)
Creatinine, Ser: 1.55 mg/dL — ABNORMAL HIGH (ref 0.61–1.24)
GFR, Estimated: 48 mL/min — ABNORMAL LOW (ref >60)
Glucose, Bld: 113 mg/dL — ABNORMAL HIGH (ref 70–99)
Potassium: 3 mmol/L — ABNORMAL LOW (ref 3.5–5.1)
Sodium: 140 mmol/L (ref 135–145)

## 2022-07-16 MED ORDER — POTASSIUM CHLORIDE CRYS ER 20 MEQ PO TBCR
40.0000 meq | EXTENDED_RELEASE_TABLET | ORAL | Status: AC
  Administered 2022-07-16 (×2): 40 meq via ORAL
  Filled 2022-07-16 (×2): qty 2

## 2022-07-16 MED ORDER — AMLODIPINE BESYLATE 5 MG PO TABS
5.0000 mg | ORAL_TABLET | Freq: Every day | ORAL | Status: DC
  Administered 2022-07-16 – 2022-07-17 (×2): 5 mg via ORAL
  Filled 2022-07-16 (×2): qty 1

## 2022-07-16 MED ORDER — AZITHROMYCIN 250 MG PO TABS
500.0000 mg | ORAL_TABLET | Freq: Every day | ORAL | Status: AC
  Administered 2022-07-16 – 2022-07-17 (×2): 500 mg via ORAL
  Filled 2022-07-16 (×2): qty 2

## 2022-07-16 NOTE — Care Management Important Message (Signed)
Important Message  Patient Details  Name: Steve Buck. MRN: [id number] Date of Birth: 10-07-1955   Medicare Important Message Given:  Yes     Dannette Barbara 07/16/2022, 12:06 PM

## 2022-07-16 NOTE — Progress Notes (Signed)
PHARMACIST - PHYSICIAN COMMUNICATION  CONCERNING: Antibiotic IV to Oral Route Change Policy  RECOMMENDATION: This patient is receiving azithromycin by the intravenous route.  Based on criteria approved by the Pharmacy and Therapeutics Committee, the antibiotic(s) is/are being converted to the equivalent oral dose form(s).   DESCRIPTION: These criteria include: Patient being treated for a respiratory tract infection, urinary tract infection, cellulitis or clostridium difficile associated diarrhea if on metronidazole The patient is not neutropenic and does not exhibit a GI malabsorption state The patient is eating (either orally or via tube) and/or has been taking other orally administered medications for a least 24 hours The patient is improving clinically and has a Tmax < 100.5  If you have questions about this conversion, please contact the [place] 07/16/22

## 2022-07-16 NOTE — Progress Notes (Addendum)
Progress Note    Steve Buck Kid.  RJJ:[id number] DOB: [birth year]  DOA: 07/13/2022 PCP: Kirk Ruths, MD      Brief Narrative:    Medical records reviewed and are as summarized below:  Steve Buck. is a 65 y.o. male  with medical history significant for chronic diastolic CHF, type II DM hypertension, obesity, who presented to the hospital with cough productive of greenish sputum, chest pain from coughing, shortness of breath, sore throat and fever.  Symptoms started about 4 days ago.  He had been taking ibuprofen for pain and fever.  He noticed that his symptoms were worsening.  His oxygen saturation was 83% last.  He was able to get oxygen saturation up into the 90s after taking deep breaths.  He went to a walk-in clinic this afternoon and he was referred to the emergency room for further management.  Of note, he said he had flulike symptoms (malaise, cough and runny nose, about a week prior to the development of his current symptoms.  However those symptoms lasted a few days and he was well until about 4 days ago when he felt sick again.         Assessment/Plan:   Principal Problem:   Sepsis ([place]) Active Problems:   CAP (community acquired pneumonia)   Acute respiratory failure with hypoxia ([place])   Hypertension associated with diabetes ([place])   CKD (chronic kidney disease) stage 3, GFR 30-59 ml/min (HCC)   Hypokalemia   Body mass index is 36.92 kg/m.  (Obesity)   Sepsis secondary to community-acquired pneumonia, leukocytosis: Leukocytosis is improving.  Continue IV ceftriaxone and azithromycin.  No growth on blood cultures thus far.  Continue Tussionex cough syrup as needed.  Repeat CBC tomorrow   Acute hypoxic respiratory failure: Improved  Persistent hypokalemia: Recurrent/persistent despite ongoing replacement.  Further oral replacement today.  Will stop HCTZ (started amlodipine) & discussed change with pt (he is agreeable).   Repeat BMP  tomorrow   CKD stage IIIb: Creatinine is stable   Chronic diastolic CHF: Compensated   Other comorbidities include type II DM, hypertension, neuropathy    Diet Order             Diet heart healthy/carb modified Room service appropriate? Yes; Fluid consistency: Thin  Diet effective now                   Consultants: None  Procedures: None    Medications:    allopurinol  150 mg Oral Daily   amLODipine  5 mg Oral Daily   atorvastatin  40 mg Oral QPM   azithromycin  500 mg Oral Daily   busPIRone  15 mg Oral BID   cholecalciferol  1,000 Units Oral Daily   cloNIDine  0.3 mg Oral BID   DULoxetine  40 mg Oral Daily   enoxaparin (LOVENOX) injection  0.5 mg/kg Subcutaneous Q24H   gabapentin  600 mg Oral BID   hydrALAZINE  50 mg Oral BID   losartan  100 mg Oral Daily   metoprolol tartrate  25 mg Oral BID   Continuous Infusions:  cefTRIAXone (ROCEPHIN)  IV 2 g (07/16/22 1505)     Anti-infectives (From admission, onward)    Start     Dose/Rate Route Frequency Ordered Stop   07/16/22 1200  azithromycin (ZITHROMAX) tablet 500 mg        500 mg Oral Daily 07/16/22 0931 07/18/22 0959   07/14/22 1600  azithromycin (ZITHROMAX)  500 mg in sodium chloride 0.9 % 250 mL IVPB  Status:  Discontinued        500 mg 250 mL/hr over 60 Minutes Intravenous Every 24 hours 07/13/22 1735 07/13/22 1740   07/14/22 1500  cefTRIAXone (ROCEPHIN) 2 g in sodium chloride 0.9 % 100 mL IVPB  Status:  Discontinued        2 g 200 mL/hr over 30 Minutes Intravenous Every 24 hours 07/13/22 1735 07/13/22 1740   07/13/22 1545  cefTRIAXone (ROCEPHIN) 2 g in sodium chloride 0.9 % 100 mL IVPB        2 g 200 mL/hr over 30 Minutes Intravenous Every 24 hours 07/13/22 1538 07/18/22 1544   07/13/22 1545  azithromycin (ZITHROMAX) 500 mg in sodium chloride 0.9 % 250 mL IVPB  Status:  Discontinued        500 mg 250 mL/hr over 60 Minutes Intravenous Every 24 hours 07/13/22 1538 07/16/22 0931        Family  Communication/Anticipated D/C date and plan/Code Status   DVT prophylaxis:      Code Status: Full Code  Family Communication: None Disposition Plan: Plan to discharge home tomorrow if further improved.    Status is: Inpatient Remains inpatient appropriate because: remains on IV antibiotics for pneumonia pending further improvement.  Persistent electrolyte derangements requiring replacement and close monitoring.   Subjective:   Pt sitting in recliner when seen on rounds.  He reports he felt better yesterday, overnight and this morning feels more run down and having increased coughing spells.  He reports getting quite short of breath with ambulating, and needing oxygen to recover from his coughing spells.   Objective:    Vitals:   07/15/22 0801 07/15/22 1559 07/15/22 2025 07/16/22 0801  BP: (!) 159/98 (!) 148/86 (!) 152/97 (!) 146/91  Pulse: 86 63 76 95  Resp:   20 20  Temp: 98.3 F (36.8 C) 98.2 F (36.8 C) 98.5 F (36.9 C) 98.8 F (37.1 C)  TempSrc: Oral Oral Oral   SpO2: 91% 94% 92% 91%  Weight:      Height:       Filed Weights   07/13/22 1713  Weight: 126.9 kg    Exam:  General exam: awake, alert, no acute distress, mildly ill-appearing HEENT: atraumatic, clear conjunctiva, anicteric sclera, moist mucus membranes, hearing grossly normal  Respiratory system: diffuse coarse rhonchi, increased respiratory effort with sitting up and with conversation, on room air Cardiovascular system: normal S1/S2, RRR, no JVD, murmurs, rubs, gallops, no pedal edema.   Gastrointestinal system: soft, NT, ND, no HSM felt, +bowel sounds. Central nervous system: A&O x3. no gross focal neurologic deficits, normal speech Extremities: moves all, no edema, normal tone Skin: dry, intact, normal temperature Psychiatry: normal mood, congruent affect, judgement and insight appear normal    Labs & Data Reviewed:   Labs notable for -- K 3.0, glucose 113, BUN 31, Cr 1.55 from 1.57, WBC  improving 11.1k, Hbg stable 12.3     LOS: 3 days   Ezekiel Slocumb, DO  [facility]   Pager on [email or web address]. If 7PM-7AM, please contact night-coverage at [email or web address]     07/16/2022, 3:49 PM

## 2022-07-17 DIAGNOSIS — J9601 Acute respiratory failure with hypoxia: Secondary | ICD-10-CM | POA: Diagnosis not present

## 2022-07-17 DIAGNOSIS — J189 Pneumonia, unspecified organism: Secondary | ICD-10-CM | POA: Diagnosis not present

## 2022-07-17 DIAGNOSIS — E876 Hypokalemia: Secondary | ICD-10-CM | POA: Diagnosis not present

## 2022-07-17 LAB — BASIC METABOLIC PANEL
Anion gap: 10 (ref 5–15)
BUN: 29 mg/dL — ABNORMAL HIGH (ref 8–23)
CO2: 31 mmol/L (ref 22–32)
Calcium: 9.2 mg/dL (ref 8.9–10.3)
Chloride: 98 mmol/L (ref 98–111)
Creatinine, Ser: 1.6 mg/dL — ABNORMAL HIGH (ref 0.61–1.24)
GFR, Estimated: 47 mL/min — ABNORMAL LOW (ref >60)
Glucose, Bld: 108 mg/dL — ABNORMAL HIGH (ref 70–99)
Potassium: 3.4 mmol/L — ABNORMAL LOW (ref 3.5–5.1)
Sodium: 139 mmol/L (ref 135–145)

## 2022-07-17 LAB — CBC
HCT: 40.6 % (ref 39.0–52.0)
Hemoglobin: 13.6 g/dL (ref 13.0–17.0)
MCH: 31.7 pg (ref 26.0–34.0)
MCHC: 33.5 g/dL (ref 30.0–36.0)
MCV: 94.6 fL (ref 80.0–100.0)
Platelets: 337 10*3/uL (ref 150–400)
RBC: 4.29 MIL/uL (ref 4.22–5.81)
RDW: 13.7 % (ref 11.5–15.5)
WBC: 12.4 10*3/uL — ABNORMAL HIGH (ref 4.0–10.5)
nRBC: 0 % (ref 0.0–0.2)

## 2022-07-17 LAB — MAGNESIUM: Magnesium: 2.2 mg/dL (ref 1.7–2.4)

## 2022-07-17 MED ORDER — AMLODIPINE BESYLATE 10 MG PO TABS: 10.0000 mg | ORAL_TABLET | Freq: Every day | ORAL | 2 refills | Status: DC

## 2022-07-17 MED ORDER — AMLODIPINE BESYLATE 10 MG PO TABS: 10.0000 mg | ORAL_TABLET | Freq: Every day | ORAL | Status: DC

## 2022-07-17 MED ORDER — LORATADINE 10 MG PO TABS
10.0000 mg | ORAL_TABLET | Freq: Every day | ORAL | Status: DC
  Administered 2022-07-17: 10 mg via ORAL
  Filled 2022-07-17: qty 1

## 2022-07-17 MED ORDER — ACETAMINOPHEN 325 MG PO TABS: 650.0000 mg | ORAL_TABLET | Freq: Four times a day (QID) | ORAL | Status: AC | PRN

## 2022-07-17 MED ORDER — FLUTICASONE PROPIONATE 50 MCG/ACT NA SUSP
2.0000 | Freq: Every day | NASAL | Status: DC
  Administered 2022-07-17: 2 via NASAL
  Filled 2022-07-17: qty 16

## 2022-07-17 MED ORDER — GUAIFENESIN ER 600 MG PO TB12
600.0000 mg | ORAL_TABLET | Freq: Two times a day (BID) | ORAL | Status: DC
  Administered 2022-07-17: 600 mg via ORAL
  Filled 2022-07-17: qty 1

## 2022-07-17 MED ORDER — LORATADINE 10 MG PO TABS: 10.0000 mg | ORAL_TABLET | Freq: Every day | ORAL | 0 refills | Status: AC

## 2022-07-17 MED ORDER — HYDROCOD POLI-CHLORPHE POLI ER 10-8 MG/5ML PO SUER: 5.0000 mL | Freq: Two times a day (BID) | ORAL | 0 refills | Status: DC | PRN

## 2022-07-17 MED ORDER — AMOXICILLIN-POT CLAVULANATE 875-125 MG PO TABS: 1.0000 | ORAL_TABLET | Freq: Two times a day (BID) | ORAL | 0 refills | Status: AC

## 2022-07-17 MED ORDER — PREDNISONE 10 MG PO TABS: 20.0000 mg | ORAL_TABLET | Freq: Every day | ORAL | 0 refills | Status: AC

## 2022-07-17 MED ORDER — GUAIFENESIN ER 600 MG PO TB12: 600.0000 mg | ORAL_TABLET | Freq: Two times a day (BID) | ORAL | 0 refills | Status: AC

## 2022-07-17 MED ORDER — FLUTICASONE PROPIONATE 50 MCG/ACT NA SUSP: 2.0000 | Freq: Every day | NASAL | 2 refills | Status: AC

## 2022-07-17 MED ORDER — LOSARTAN POTASSIUM 100 MG PO TABS: 100.0000 mg | ORAL_TABLET | Freq: Every day | ORAL | 2 refills | Status: DC

## 2022-07-17 NOTE — Discharge Summary (Addendum)
Physician Discharge Summary   Patient: Steve Buck. MRN: [id number] DOB: February 15, 1956  Admit date:     07/13/2022  Discharge date: 07/18/22  Discharge Physician: Ezekiel Slocumb   PCP: Kirk Ruths, MD   Recommendations at discharge:    Follow up with Primary Care in 1-2 weeks Repeat BMP, Mg, CBC in 1-2 weeks Follow up on recovery from pneumonia Follow up on BP - change in therapy made due to recurrent hypokalemia (stopped HCTZ, started amlodipine)  Discharge Diagnoses: Active Problems:   CAP (community acquired pneumonia)   Hypertension associated with diabetes ([place])   CKD (chronic kidney disease) stage 3, GFR 30-59 ml/min ([facility])   Hypokalemia  Principal Problem (Resolved):   Sepsis ([place]) Resolved Problems:   Acute respiratory failure with hypoxia [facility])  Hospital Course: Steve Buck. is a 66 y.o. male  with medical history significant for chronic diastolic CHF, type II DM hypertension, obesity, who presented to the hospital with cough productive of greenish sputum, chest pain from coughing, shortness of breath, sore throat and fever.  Symptoms started about 4 days ago.  He had been taking ibuprofen for pain and fever.  He noticed that his symptoms were worsening.  His oxygen saturation was 83% last.  He was able to get oxygen saturation up into the 90s after taking deep breaths.  He went to a walk-in clinic this afternoon and he was referred to the emergency room for further management.  Of note, he said he had flulike symptoms (malaise, cough and runny nose, about a week prior to the development of his current symptoms.  However those symptoms lasted a few days and he was well until about 4 days ago when he felt sick again.   Assessment and Plan:  Severe sepsis secondary to community-acquired pneumonia, leukocytosis:  Sepsis POA as evidenced by leukocytosis, tachycardia.  Organ dysfunction present given Acute Respiratory Failure with Hypoxia (O2 sat on room  air was 83%), consistent with severe sepsis. Treated with IV ceftriaxone and azithromycin.   Will discharge with 3 more days PO antibiotic, prednisone, Continue Tussionex and supportive care.   No growth on blood cultures Weaned off oxygen. 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  Further oral replacement today.  Stopped HCTZ & discussed change with pt (he is agreeable).  Started amlodipine in place of HCTZ.  Repeat BMP at follow up.   CKD stage IIIb: Creatinine is stable.   Chronic diastolic CHF: Compensated   Other comorbidities include type II DM, hypertension, neuropathy       Consultants: None Procedures performed: None  Disposition: Home Diet recommendation:  Cardiac diet DISCHARGE MEDICATION: Allergies as of 07/17/2022       Reactions   Lisinopril Rash, Cough        Medication List     STOP taking these medications    losartan-hydrochlorothiazide 100-25 MG tablet Commonly known as: HYZAAR       TAKE these medications    acetaminophen 325 MG tablet Commonly known as: TYLENOL Take 2 tablets (650 mg total) by mouth every 6 (six) hours as needed for mild pain, fever or headache.   allopurinol 300 MG tablet Commonly known as: ZYLOPRIM Take 0.5 tablets (150 mg total) by mouth daily.   amLODipine 10 MG tablet Commonly known as: NORVASC Take 1 tablet (10 mg total) by mouth daily.   amoxicillin-clavulanate 875-125 MG tablet Commonly known as: AUGMENTIN Take 1  tablet by mouth 2 (two) times daily for 3 days.   ascorbic acid 500 MG tablet Commonly known as: VITAMIN C Take 2,000 mg by mouth daily.   atorvastatin 40 MG tablet Commonly known as: LIPITOR Take 40 mg by mouth every evening.   b complex vitamins capsule Take 1 capsule by mouth daily.   busPIRone 15 MG tablet Commonly known as: BUSPAR Take 15 mg by mouth 2 (two) times daily.    chlorpheniramine-HYDROcodone 10-8 MG/5ML Commonly known as: TUSSIONEX Take 5 mLs by mouth every 12 (twelve) hours as needed for cough.   cholecalciferol 25 MCG (1000 UNIT) tablet Commonly known as: VITAMIN D3 Take 1,000 Units by mouth daily.   cloNIDine 0.3 MG tablet Commonly known as: CATAPRES Take 1 tablet (0.3 mg total) by mouth 2 (two) times daily.   DULoxetine 20 MG capsule Commonly known as: CYMBALTA Take 40 mg by mouth daily.   fluticasone 50 MCG/ACT nasal spray Commonly known as: FLONASE Place 2 sprays into both nostrils daily.   gabapentin 300 MG capsule Commonly known as: NEURONTIN Take 1 capsule (300 mg total) by mouth 3 (three) times daily. Home med. What changed:  how much to take when to take this additional instructions   glucose blood test strip 1 each by Other route 3 (three) times daily. Use as instructed   guaiFENesin 600 MG 12 hr tablet Commonly known as: MUCINEX Take 1 tablet (600 mg total) by mouth 2 (two) times daily for 7 days.   hydrALAZINE 50 MG tablet Commonly known as: APRESOLINE Take 50 mg by mouth 2 (two) times daily.   loratadine 10 MG tablet Commonly known as: CLARITIN Take 1 tablet (10 mg total) by mouth daily.   losartan 100 MG tablet Commonly known as: Cozaar Take 1 tablet (100 mg total) by mouth daily.   metoprolol tartrate 25 MG tablet Commonly known as: LOPRESSOR TAKE 1 TABLET BY MOUTH TWICE A DAY   predniSONE 10 MG tablet Commonly known as: DELTASONE Take 2 tablets (20 mg total) by mouth daily for 3 days.        Discharge Exam: Filed Weights   07/13/22 1713  Weight: 126.9 kg   General exam: awake, alert, no acute distress HEENT: atraumatic, clear conjunctiva, anicteric sclera, moist mucus membranes, hearing grossly normal  Respiratory system: CTAB, no wheezes, intermittent rhonchi, normal respiratory effort. Cardiovascular system: normal S1/S2, RRR, no pedal edema.   Gastrointestinal system: soft, NT, ND, no  HSM felt, +bowel sounds. Central nervous system: A&O x4. no gross focal neurologic deficits, normal speech Extremities: moves all, no edema, normal tone Skin: dry, intact, normal temperature Psychiatry: normal mood, congruent affect, judgement and insight appear normal   Condition at discharge: stable  The results of significant diagnostics from this hospitalization (including imaging, microbiology, ancillary and laboratory) are listed below for reference.   Imaging Studies: CT Angio Chest PE W and/or Wo Contrast  Result Date: 07/13/2022 CLINICAL DATA:  Flu-like symptoms. EXAM: CT ANGIOGRAPHY CHEST WITH CONTRAST TECHNIQUE: Multidetector CT imaging of the chest was performed using the standard protocol during bolus administration of intravenous contrast. Multiplanar CT image reconstructions and MIPs were obtained to evaluate the vascular anatomy. RADIATION DOSE REDUCTION: This exam was performed according to the departmental dose-optimization program which includes automated exposure control, adjustment of the mA and/or kV according to patient size and/or use of iterative reconstruction technique. CONTRAST:  80m OMNIPAQUE IOHEXOL 350 MG/ML SOLN COMPARISON:  None Available. FINDINGS: Cardiovascular: Satisfactory opacification of the pulmonary arteries to  the segmental level. No evidence of pulmonary embolism. Heart is mildly enlarged. No pericardial effusion. Mediastinum/Nodes: There are numerous nonenlarged and mildly enlarged paratracheal lymph nodes measuring up to 11 mm. There is an enlarged AP window lymph node measuring 15 mm. There is an enlarged subcarinal lymph node measuring 16 mm. There are nonenlarged and enlarged right hilar lymph nodes measuring 13 mm. Visualized esophagus and thyroid gland are within normal limits. Lungs/Pleura: Patchy airspace and ground-glass opacities are seen throughout the inferior right lower lobe and minimally in the right middle lobe. There is diffuse  peribronchial wall thickening bilaterally most significant in the right middle lobe and right lower lobe. There is some peripheral mucous plugging in the right lower lobe. No pneumothorax. No pleural effusion. Upper Abdomen: No acute abnormality. Musculoskeletal: Degenerative changes affect the spine. Review of the MIP images confirms the above findings. IMPRESSION: 1. No evidence for pulmonary embolism. 2. Patchy airspace and ground-glass opacities in the right lower lobe and right middle lobe worrisome for pneumonia. 3. Diffuse peribronchial wall thickening compatible with bronchitis. 4. Peripheral mucous plugging in the right lower lobe, aspiration is a possibility. 5. Mediastinal and right hilar lymphadenopathy, likely reactive. 6. Mild cardiomegaly. Electronically Signed   By: Ronney Asters M.D.   On: 07/13/2022 16:28   DG Chest 2 View  Result Date: 07/13/2022 CLINICAL DATA:  Chest pain and productive cough. EXAM: CHEST - 2 VIEW COMPARISON:  Chest radiograph dated 06/15/2021. FINDINGS: The heart size and mediastinal contours are within normal limits. Both lungs are clear. Degenerative changes are seen in the spine. A loop recorder device overlies the left chest. IMPRESSION: No active cardiopulmonary disease. Electronically Signed   By: Zerita Boers M.D.   On: 07/13/2022 15:34    Microbiology: Results for orders placed or performed during the hospital encounter of 07/13/22  Resp Panel by RT-PCR (Flu A&B, Covid) Anterior Nasal Swab     Status: None   Collection Time: 07/13/22  3:41 PM   Specimen: Anterior Nasal Swab  Result Value Ref Range Status   SARS Coronavirus 2 by RT PCR NEGATIVE NEGATIVE Final    Comment: (NOTE) SARS-CoV-2 target nucleic acids are NOT DETECTED.  The SARS-CoV-2 RNA is generally detectable in upper respiratory specimens during the acute phase of infection. The lowest concentration of SARS-CoV-2 viral copies this assay can detect is 138 copies/mL. A negative result does  not preclude SARS-Cov-2 infection and should not be used as the sole basis for treatment or other patient management decisions. A negative result may occur with  improper specimen collection/handling, submission of specimen other than nasopharyngeal swab, presence of viral mutation(s) within the areas targeted by this assay, and inadequate number of viral copies(<138 copies/mL). A negative result must be combined with clinical observations, patient history, and epidemiological information. The expected result is Negative.  Fact Sheet for Patients:  EntrepreneurPulse.com.au  Fact Sheet for Healthcare Providers:  IncredibleEmployment.be  This test is no t yet approved or cleared by the [place] FDA and  has been authorized for detection and/or diagnosis of SARS-CoV-2 by FDA under an Emergency Use Authorization (EUA). This EUA will remain  in effect (meaning this test can be used) for the duration of the COVID-19 declaration under Section 564(b)(1) of the Act, 21 U.S.C.section 360bbb-3(b)(1), unless the authorization is terminated  or revoked sooner.       Influenza A by PCR NEGATIVE NEGATIVE Final   Influenza B by PCR NEGATIVE NEGATIVE Final    Comment: (NOTE) The Xpert  Xpress SARS-CoV-2/FLU/RSV plus assay is intended as an aid in the diagnosis of influenza from Nasopharyngeal swab specimens and should not be used as a sole basis for treatment. Nasal washings and aspirates are unacceptable for Xpert Xpress SARS-CoV-2/FLU/RSV testing.  Fact Sheet for Patients: EntrepreneurPulse.com.au  Fact Sheet for Healthcare Providers: IncredibleEmployment.be  This test is not yet approved or cleared by the [place] FDA and has been authorized for detection and/or diagnosis of SARS-CoV-2 by FDA under an Emergency Use Authorization (EUA). This EUA will remain in effect (meaning this test can be used) for the  duration of the COVID-19 declaration under Section 564(b)(1) of the Act, 21 U.S.C. section 360bbb-3(b)(1), unless the authorization is terminated or revoked.  Performed at [facility], [address] [place], [place] [address]   Blood Culture (routine x 2)     Status: None   Collection Time: 07/13/22  3:41 PM   Specimen: BLOOD  Result Value Ref Range Status   Specimen Description BLOOD BLOOD LEFT FOREARM  Final   Special Requests   Final    BOTTLES DRAWN AEROBIC AND ANAEROBIC Blood Culture results may not be optimal due to an excessive volume of blood received in culture bottles   Culture   Final    NO GROWTH 5 DAYS Performed at [facility], [address] [place], [place] [address]    Report Status 07/18/2022 FINAL  Final  Blood Culture (routine x 2)     Status: None   Collection Time: 07/13/22  3:41 PM   Specimen: BLOOD  Result Value Ref Range Status   Specimen Description BLOOD LEFT ANTECUBITAL  Final   Special Requests   Final    BOTTLES DRAWN AEROBIC AND ANAEROBIC Blood Culture adequate volume   Culture   Final    NO GROWTH 5 DAYS Performed at [facility], [address] [place], [place] [address]    Report Status 07/18/2022 FINAL  Final    Labs: CBC: Recent Labs  Lab 07/13/22 1508 07/14/22 0549 07/15/22 0544 07/16/22 0710 07/17/22 0544  WBC 18.2* 17.3* 12.5* 11.1* 12.4*  NEUTROABS  --  13.3* 8.6* 7.5  --   HGB 12.9* 12.7* 12.3* 12.3* 13.6  HCT 37.9* 37.5* 36.2* 36.5* 40.6  MCV 94.0 95.2 95.8 94.1 94.6  PLT 288 253 273 298 644   Basic Metabolic Panel: Recent Labs  Lab 07/13/22 1508 07/13/22 1555 07/14/22 0549 07/15/22 0544 07/16/22 0710 07/17/22 0544  NA 137  --  141 141 140 139  K 2.7*  --  2.5* 2.8* 3.0* 3.4*  CL 94*  --  96* 99 100 98  CO2 30  --  32 '31 31 31  '$ GLUCOSE 122*  --  127* 118* 113* 108*  BUN 32*  --  32* 32* 31* 29*  CREATININE 1.93*  --  1.87* 1.57* 1.55* 1.60*  CALCIUM 9.0  --  8.9 8.7* 8.9 9.2   MG  --  2.1 2.0 2.1  --  2.2   Liver Function Tests: No results for input(s): "AST", "ALT", "ALKPHOS", "BILITOT", "PROT", "ALBUMIN" in the last 168 hours. CBG: Recent Labs  Lab 07/13/22 1507 07/13/22 2007  GLUCAP 111* 204*    Discharge time spent: greater than 30 minutes.  Signed: Ezekiel Slocumb, DO [facility] 07/18/2022

## 2022-07-17 NOTE — Plan of Care (Signed)

## 2022-07-18 ENCOUNTER — Encounter: Payer: Self-pay | Admitting: Internal Medicine

## 2022-07-18 LAB — CULTURE, BLOOD (ROUTINE X 2)
Culture: NO GROWTH
Culture: NO GROWTH
Special Requests: ADEQUATE

## 2022-07-24 DIAGNOSIS — Z23 Encounter for immunization: Secondary | ICD-10-CM | POA: Diagnosis not present

## 2022-07-24 DIAGNOSIS — J189 Pneumonia, unspecified organism: Secondary | ICD-10-CM | POA: Diagnosis not present

## 2022-07-28 DIAGNOSIS — D692 Other nonthrombocytopenic purpura: Secondary | ICD-10-CM | POA: Diagnosis not present

## 2022-07-28 DIAGNOSIS — E1122 Type 2 diabetes mellitus with diabetic chronic kidney disease: Secondary | ICD-10-CM | POA: Diagnosis not present

## 2022-07-28 DIAGNOSIS — I110 Hypertensive heart disease with heart failure: Secondary | ICD-10-CM | POA: Diagnosis not present

## 2022-07-28 DIAGNOSIS — E1142 Type 2 diabetes mellitus with diabetic polyneuropathy: Secondary | ICD-10-CM | POA: Diagnosis not present

## 2022-07-28 DIAGNOSIS — I4819 Other persistent atrial fibrillation: Secondary | ICD-10-CM | POA: Diagnosis not present

## 2022-07-28 DIAGNOSIS — N189 Chronic kidney disease, unspecified: Secondary | ICD-10-CM | POA: Diagnosis not present

## 2022-07-28 DIAGNOSIS — I509 Heart failure, unspecified: Secondary | ICD-10-CM | POA: Diagnosis not present

## 2022-07-28 DIAGNOSIS — Z6836 Body mass index (BMI) 36.0-36.9, adult: Secondary | ICD-10-CM | POA: Diagnosis not present

## 2022-07-28 DIAGNOSIS — E1151 Type 2 diabetes mellitus with diabetic peripheral angiopathy without gangrene: Secondary | ICD-10-CM | POA: Diagnosis not present

## 2022-08-05 DIAGNOSIS — I480 Paroxysmal atrial fibrillation: Secondary | ICD-10-CM | POA: Diagnosis not present

## 2022-08-05 DIAGNOSIS — M109 Gout, unspecified: Secondary | ICD-10-CM | POA: Diagnosis not present

## 2022-08-05 DIAGNOSIS — I10 Essential (primary) hypertension: Secondary | ICD-10-CM | POA: Diagnosis not present

## 2022-08-05 DIAGNOSIS — E1122 Type 2 diabetes mellitus with diabetic chronic kidney disease: Secondary | ICD-10-CM | POA: Diagnosis not present

## 2022-08-05 DIAGNOSIS — E1142 Type 2 diabetes mellitus with diabetic polyneuropathy: Secondary | ICD-10-CM | POA: Diagnosis not present

## 2022-08-05 DIAGNOSIS — N183 Chronic kidney disease, stage 3 unspecified: Secondary | ICD-10-CM | POA: Diagnosis not present

## 2022-08-05 DIAGNOSIS — I5032 Chronic diastolic (congestive) heart failure: Secondary | ICD-10-CM | POA: Diagnosis not present

## 2022-08-19 DIAGNOSIS — R202 Paresthesia of skin: Secondary | ICD-10-CM | POA: Diagnosis not present

## 2022-08-19 DIAGNOSIS — R200 Anesthesia of skin: Secondary | ICD-10-CM | POA: Diagnosis not present

## 2022-09-04 DIAGNOSIS — R2689 Other abnormalities of gait and mobility: Secondary | ICD-10-CM | POA: Diagnosis not present

## 2022-09-04 DIAGNOSIS — M79604 Pain in right leg: Secondary | ICD-10-CM | POA: Diagnosis not present

## 2022-09-04 DIAGNOSIS — E1142 Type 2 diabetes mellitus with diabetic polyneuropathy: Secondary | ICD-10-CM | POA: Diagnosis not present

## 2022-09-04 DIAGNOSIS — E1169 Type 2 diabetes mellitus with other specified complication: Secondary | ICD-10-CM | POA: Diagnosis not present

## 2022-09-23 DIAGNOSIS — E1159 Type 2 diabetes mellitus with other circulatory complications: Secondary | ICD-10-CM | POA: Diagnosis not present

## 2022-09-23 DIAGNOSIS — E782 Mixed hyperlipidemia: Secondary | ICD-10-CM | POA: Diagnosis not present

## 2022-09-23 DIAGNOSIS — N183 Chronic kidney disease, stage 3 unspecified: Secondary | ICD-10-CM | POA: Diagnosis not present

## 2022-09-23 DIAGNOSIS — I152 Hypertension secondary to endocrine disorders: Secondary | ICD-10-CM | POA: Diagnosis not present

## 2022-09-23 DIAGNOSIS — E1122 Type 2 diabetes mellitus with diabetic chronic kidney disease: Secondary | ICD-10-CM | POA: Diagnosis not present

## 2022-09-30 ENCOUNTER — Other Ambulatory Visit
Admission: RE | Admit: 2022-09-30 | Discharge: 2022-09-30 | Disposition: A | Discharge status: Home / Self Care | Payer: PPO | Source: Ambulatory Visit | Attending: Internal Medicine | Admitting: Internal Medicine

## 2022-09-30 DIAGNOSIS — M7989 Other specified soft tissue disorders: Secondary | ICD-10-CM | POA: Insufficient documentation

## 2022-09-30 DIAGNOSIS — N183 Chronic kidney disease, stage 3 unspecified: Secondary | ICD-10-CM | POA: Diagnosis not present

## 2022-09-30 DIAGNOSIS — I480 Paroxysmal atrial fibrillation: Secondary | ICD-10-CM | POA: Diagnosis not present

## 2022-09-30 DIAGNOSIS — I10 Essential (primary) hypertension: Secondary | ICD-10-CM | POA: Diagnosis not present

## 2022-09-30 DIAGNOSIS — E1142 Type 2 diabetes mellitus with diabetic polyneuropathy: Secondary | ICD-10-CM | POA: Diagnosis not present

## 2022-09-30 DIAGNOSIS — E1122 Type 2 diabetes mellitus with diabetic chronic kidney disease: Secondary | ICD-10-CM | POA: Diagnosis not present

## 2022-09-30 DIAGNOSIS — I5032 Chronic diastolic (congestive) heart failure: Secondary | ICD-10-CM | POA: Diagnosis not present

## 2022-09-30 LAB — D-DIMER, QUANTITATIVE: D-Dimer, Quant: 1.1 ug/mL-FEU — ABNORMAL HIGH (ref 0.00–0.50)

## 2022-10-01 ENCOUNTER — Ambulatory Visit
Admission: RE | Admit: 2022-10-01 | Discharge: 2022-10-01 | Disposition: A | Discharge status: Home / Self Care | Payer: PPO | Source: Ambulatory Visit | Attending: Internal Medicine | Admitting: Internal Medicine

## 2022-10-01 ENCOUNTER — Other Ambulatory Visit: Payer: Self-pay | Admitting: Internal Medicine

## 2022-10-01 DIAGNOSIS — R7989 Other specified abnormal findings of blood chemistry: Secondary | ICD-10-CM

## 2022-10-01 DIAGNOSIS — M79604 Pain in right leg: Secondary | ICD-10-CM | POA: Diagnosis not present

## 2022-10-01 DIAGNOSIS — M7989 Other specified soft tissue disorders: Secondary | ICD-10-CM | POA: Diagnosis not present

## 2022-11-19 ENCOUNTER — Other Ambulatory Visit: Payer: Self-pay

## 2022-11-19 ENCOUNTER — Inpatient Hospital Stay (HOSPITAL_COMMUNITY)
Admit: 2022-11-19 | Discharge: 2022-11-19 | Disposition: A | Discharge status: Home / Self Care | Payer: Medicare HMO | Attending: Cardiology | Admitting: Cardiology

## 2022-11-19 ENCOUNTER — Observation Stay
Admission: EM | Admit: 2022-11-19 | Discharge: 2022-11-20 | Disposition: A | Discharge status: Home / Self Care | Payer: Medicare HMO | Attending: Emergency Medicine | Admitting: Emergency Medicine

## 2022-11-19 ENCOUNTER — Emergency Department: Payer: Medicare HMO

## 2022-11-19 ENCOUNTER — Encounter: Payer: Self-pay | Admitting: *Deleted

## 2022-11-19 DIAGNOSIS — I2489 Other forms of acute ischemic heart disease: Secondary | ICD-10-CM

## 2022-11-19 DIAGNOSIS — K828 Other specified diseases of gallbladder: Secondary | ICD-10-CM | POA: Diagnosis not present

## 2022-11-19 DIAGNOSIS — R079 Chest pain, unspecified: Secondary | ICD-10-CM | POA: Diagnosis not present

## 2022-11-19 DIAGNOSIS — E872 Acidosis, unspecified: Secondary | ICD-10-CM | POA: Diagnosis not present

## 2022-11-19 DIAGNOSIS — E1122 Type 2 diabetes mellitus with diabetic chronic kidney disease: Secondary | ICD-10-CM | POA: Diagnosis not present

## 2022-11-19 DIAGNOSIS — Z79899 Other long term (current) drug therapy: Secondary | ICD-10-CM | POA: Insufficient documentation

## 2022-11-19 DIAGNOSIS — I214 Non-ST elevation (NSTEMI) myocardial infarction: Secondary | ICD-10-CM | POA: Diagnosis not present

## 2022-11-19 DIAGNOSIS — Z950 Presence of cardiac pacemaker: Secondary | ICD-10-CM | POA: Insufficient documentation

## 2022-11-19 DIAGNOSIS — E669 Obesity, unspecified: Secondary | ICD-10-CM | POA: Diagnosis not present

## 2022-11-19 DIAGNOSIS — M109 Gout, unspecified: Secondary | ICD-10-CM | POA: Diagnosis present

## 2022-11-19 DIAGNOSIS — Z6836 Body mass index (BMI) 36.0-36.9, adult: Secondary | ICD-10-CM | POA: Diagnosis not present

## 2022-11-19 DIAGNOSIS — I2510 Atherosclerotic heart disease of native coronary artery without angina pectoris: Secondary | ICD-10-CM | POA: Diagnosis not present

## 2022-11-19 DIAGNOSIS — I130 Hypertensive heart and chronic kidney disease with heart failure and stage 1 through stage 4 chronic kidney disease, or unspecified chronic kidney disease: Secondary | ICD-10-CM | POA: Insufficient documentation

## 2022-11-19 DIAGNOSIS — E1121 Type 2 diabetes mellitus with diabetic nephropathy: Secondary | ICD-10-CM | POA: Diagnosis present

## 2022-11-19 DIAGNOSIS — E1129 Type 2 diabetes mellitus with other diabetic kidney complication: Secondary | ICD-10-CM | POA: Diagnosis present

## 2022-11-19 DIAGNOSIS — K8020 Calculus of gallbladder without cholecystitis without obstruction: Secondary | ICD-10-CM | POA: Insufficient documentation

## 2022-11-19 DIAGNOSIS — I4891 Unspecified atrial fibrillation: Secondary | ICD-10-CM | POA: Diagnosis not present

## 2022-11-19 DIAGNOSIS — N1831 Chronic kidney disease, stage 3a: Secondary | ICD-10-CM | POA: Diagnosis present

## 2022-11-19 DIAGNOSIS — R109 Unspecified abdominal pain: Secondary | ICD-10-CM | POA: Diagnosis present

## 2022-11-19 DIAGNOSIS — R1013 Epigastric pain: Secondary | ICD-10-CM

## 2022-11-19 DIAGNOSIS — I5032 Chronic diastolic (congestive) heart failure: Secondary | ICD-10-CM | POA: Insufficient documentation

## 2022-11-19 DIAGNOSIS — E876 Hypokalemia: Secondary | ICD-10-CM | POA: Diagnosis not present

## 2022-11-19 DIAGNOSIS — N1832 Chronic kidney disease, stage 3b: Secondary | ICD-10-CM | POA: Diagnosis present

## 2022-11-19 DIAGNOSIS — R7989 Other specified abnormal findings of blood chemistry: Secondary | ICD-10-CM | POA: Diagnosis present

## 2022-11-19 DIAGNOSIS — I10 Essential (primary) hypertension: Secondary | ICD-10-CM

## 2022-11-19 DIAGNOSIS — I161 Hypertensive emergency: Secondary | ICD-10-CM | POA: Diagnosis not present

## 2022-11-19 DIAGNOSIS — R7402 Elevation of levels of lactic acid dehydrogenase (LDH): Secondary | ICD-10-CM | POA: Insufficient documentation

## 2022-11-19 DIAGNOSIS — D649 Anemia, unspecified: Secondary | ICD-10-CM | POA: Diagnosis not present

## 2022-11-19 DIAGNOSIS — R112 Nausea with vomiting, unspecified: Secondary | ICD-10-CM

## 2022-11-19 HISTORY — DX: Non-ST elevation (NSTEMI) myocardial infarction: I21.4

## 2022-11-19 LAB — URINE DRUG SCREEN, QUALITATIVE (ARMC ONLY)
Amphetamines, Ur Screen: NOT DETECTED
Barbiturates, Ur Screen: NOT DETECTED
Benzodiazepine, Ur Scrn: NOT DETECTED
Cannabinoid 50 Ng, Ur ~~LOC~~: POSITIVE — AB
Cocaine Metabolite,Ur ~~LOC~~: NOT DETECTED
MDMA (Ecstasy)Ur Screen: NOT DETECTED
Methadone Scn, Ur: NOT DETECTED
Opiate, Ur Screen: NOT DETECTED
Phencyclidine (PCP) Ur S: NOT DETECTED
Tricyclic, Ur Screen: NOT DETECTED

## 2022-11-19 LAB — URINALYSIS, ROUTINE W REFLEX MICROSCOPIC
Bacteria, UA: NONE SEEN
Bilirubin Urine: NEGATIVE
Glucose, UA: NEGATIVE mg/dL
Hgb urine dipstick: NEGATIVE
Ketones, ur: NEGATIVE mg/dL
Leukocytes,Ua: NEGATIVE
Nitrite: NEGATIVE
Protein, ur: >=300 mg/dL — AB
Specific Gravity, Urine: 1.028 (ref 1.005–1.030)
Squamous Epithelial / HPF: NONE SEEN /HPF (ref 0–5)
pH: 5 (ref 5.0–8.0)

## 2022-11-19 LAB — CBC WITH DIFFERENTIAL/PLATELET
Abs Immature Granulocytes: 0.06 10*3/uL (ref 0.00–0.07)
Basophils Absolute: 0.1 10*3/uL (ref 0.0–0.1)
Basophils Relative: 0 %
Eosinophils Absolute: 0.2 10*3/uL (ref 0.0–0.5)
Eosinophils Relative: 2 %
HCT: 50 % (ref 39.0–52.0)
Hemoglobin: 16.8 g/dL (ref 13.0–17.0)
Immature Granulocytes: 0 %
Lymphocytes Relative: 31 %
Lymphs Abs: 4.8 10*3/uL — ABNORMAL HIGH (ref 0.7–4.0)
MCH: 30.8 pg (ref 26.0–34.0)
MCHC: 33.6 g/dL (ref 30.0–36.0)
MCV: 91.6 fL (ref 80.0–100.0)
Monocytes Absolute: 1 10*3/uL (ref 0.1–1.0)
Monocytes Relative: 7 %
Neutro Abs: 9.2 10*3/uL — ABNORMAL HIGH (ref 1.7–7.7)
Neutrophils Relative %: 60 %
Platelets: 396 10*3/uL (ref 150–400)
RBC: 5.46 MIL/uL (ref 4.22–5.81)
RDW: 15.6 % — ABNORMAL HIGH (ref 11.5–15.5)
WBC: 15.4 10*3/uL — ABNORMAL HIGH (ref 4.0–10.5)
nRBC: 0 % (ref 0.0–0.2)

## 2022-11-19 LAB — ECHOCARDIOGRAM COMPLETE
AR max vel: 3.99 cm2
AV Area VTI: 4.26 cm2
AV Area mean vel: 3.77 cm2
AV Mean grad: 3.5 mmHg
AV Peak grad: 5.2 mmHg
Ao pk vel: 1.15 m/s
Area-P 1/2: 2.95 cm2
Height: 73 in
Weight: 4400 oz

## 2022-11-19 LAB — TROPONIN I (HIGH SENSITIVITY)
Troponin I (High Sensitivity): 61 ng/L — ABNORMAL HIGH (ref <18)
Troponin I (High Sensitivity): 75 ng/L — ABNORMAL HIGH (ref <18)
Troponin I (High Sensitivity): 102 ng/L (ref <18)
Troponin I (High Sensitivity): 102 ng/L (ref <18)
Troponin I (High Sensitivity): 107 ng/L (ref <18)
Troponin I (High Sensitivity): 110 ng/L (ref <18)

## 2022-11-19 LAB — COMPREHENSIVE METABOLIC PANEL
ALT: 25 U/L (ref 0–44)
AST: 31 U/L (ref 15–41)
Albumin: 4.2 g/dL (ref 3.5–5.0)
Alkaline Phosphatase: 95 U/L (ref 38–126)
Anion gap: 13 (ref 5–15)
BUN: 24 mg/dL — ABNORMAL HIGH (ref 8–23)
CO2: 24 mmol/L (ref 22–32)
Calcium: 9.5 mg/dL (ref 8.9–10.3)
Chloride: 103 mmol/L (ref 98–111)
Creatinine, Ser: 1.63 mg/dL — ABNORMAL HIGH (ref 0.61–1.24)
GFR, Estimated: 46 mL/min — ABNORMAL LOW (ref >60)
Glucose, Bld: 188 mg/dL — ABNORMAL HIGH (ref 70–99)
Potassium: 2.9 mmol/L — ABNORMAL LOW (ref 3.5–5.1)
Sodium: 140 mmol/L (ref 135–145)
Total Bilirubin: 1.4 mg/dL — ABNORMAL HIGH (ref 0.3–1.2)
Total Protein: 7.9 g/dL (ref 6.5–8.1)

## 2022-11-19 LAB — MAGNESIUM: Magnesium: 1.8 mg/dL (ref 1.7–2.4)

## 2022-11-19 LAB — CBC
HCT: 49.8 % (ref 39.0–52.0)
Hemoglobin: 16.9 g/dL (ref 13.0–17.0)
MCH: 31.1 pg (ref 26.0–34.0)
MCHC: 33.9 g/dL (ref 30.0–36.0)
MCV: 91.7 fL (ref 80.0–100.0)
Platelets: 377 10*3/uL (ref 150–400)
RBC: 5.43 MIL/uL (ref 4.22–5.81)
RDW: 15.6 % — ABNORMAL HIGH (ref 11.5–15.5)
WBC: 15.4 10*3/uL — ABNORMAL HIGH (ref 4.0–10.5)
nRBC: 0.1 % (ref 0.0–0.2)

## 2022-11-19 LAB — LACTIC ACID, PLASMA
Lactic Acid, Venous: 2.1 mmol/L (ref 0.5–1.9)
Lactic Acid, Venous: 2.5 mmol/L (ref 0.5–1.9)

## 2022-11-19 LAB — APTT: aPTT: 25 seconds (ref 24–36)

## 2022-11-19 LAB — BRAIN NATRIURETIC PEPTIDE: B Natriuretic Peptide: 212.6 pg/mL — ABNORMAL HIGH (ref 0.0–100.0)

## 2022-11-19 LAB — PROTIME-INR
INR: 1.1 (ref 0.8–1.2)
Prothrombin Time: 14.5 seconds (ref 11.4–15.2)

## 2022-11-19 LAB — PROCALCITONIN: Procalcitonin: <0.1 ng/mL

## 2022-11-19 LAB — LIPASE, BLOOD: Lipase: 42 U/L (ref 11–51)

## 2022-11-19 LAB — PHOSPHORUS: Phosphorus: 2.9 mg/dL (ref 2.5–4.6)

## 2022-11-19 MED ORDER — B COMPLEX-C PO TABS
1.0000 | ORAL_TABLET | Freq: Every day | ORAL | Status: DC
  Administered 2022-11-20: 1 via ORAL
  Filled 2022-11-19: qty 1

## 2022-11-19 MED ORDER — FAMOTIDINE IN NACL 20-0.9 MG/50ML-% IV SOLN
20.0000 mg | Freq: Once | INTRAVENOUS | Status: AC
  Administered 2022-11-19: 20 mg via INTRAVENOUS
  Filled 2022-11-19: qty 50

## 2022-11-19 MED ORDER — HYDROMORPHONE HCL 1 MG/ML IJ SOLN
1.0000 mg | Freq: Once | INTRAMUSCULAR | Status: AC
  Administered 2022-11-19: 1 mg via INTRAVENOUS
  Filled 2022-11-19: qty 1

## 2022-11-19 MED ORDER — ONDANSETRON HCL 4 MG/2ML IJ SOLN: 4.0000 mg | Freq: Once | INTRAMUSCULAR | Status: DC

## 2022-11-19 MED ORDER — BUSPIRONE HCL 5 MG PO TABS
15.0000 mg | ORAL_TABLET | Freq: Two times a day (BID) | ORAL | Status: DC
  Administered 2022-11-19 – 2022-11-20 (×3): 15 mg via ORAL
  Filled 2022-11-19 (×3): qty 3

## 2022-11-19 MED ORDER — IOHEXOL 350 MG/ML SOLN
100.0000 mL | Freq: Once | INTRAVENOUS | Status: AC | PRN
  Administered 2022-11-19: 100 mL via INTRAVENOUS

## 2022-11-19 MED ORDER — SODIUM CHLORIDE 0.9 % IV BOLUS
500.0000 mL | Freq: Once | INTRAVENOUS | Status: AC
  Administered 2022-11-19: 500 mL via INTRAVENOUS

## 2022-11-19 MED ORDER — MORPHINE SULFATE (PF) 2 MG/ML IV SOLN: 2.0000 mg | INTRAVENOUS | Status: DC | PRN

## 2022-11-19 MED ORDER — HEPARIN SODIUM (PORCINE) 5000 UNIT/ML IJ SOLN
5000.0000 [IU] | Freq: Three times a day (TID) | INTRAMUSCULAR | Status: DC
  Administered 2022-11-19 – 2022-11-20 (×3): 5000 [IU] via SUBCUTANEOUS
  Filled 2022-11-19 (×3): qty 1

## 2022-11-19 MED ORDER — ALUM & MAG HYDROXIDE-SIMETH 200-200-20 MG/5ML PO SUSP
30.0000 mL | Freq: Once | ORAL | Status: AC
  Administered 2022-11-19: 30 mL via ORAL
  Filled 2022-11-19: qty 30

## 2022-11-19 MED ORDER — HYDRALAZINE HCL 50 MG PO TABS
50.0000 mg | ORAL_TABLET | Freq: Two times a day (BID) | ORAL | Status: DC
  Administered 2022-11-19: 50 mg via ORAL
  Filled 2022-11-19: qty 1

## 2022-11-19 MED ORDER — ONDANSETRON HCL 4 MG/2ML IJ SOLN
4.0000 mg | Freq: Once | INTRAMUSCULAR | Status: AC
  Administered 2022-11-19: 4 mg via INTRAVENOUS
  Filled 2022-11-19: qty 2

## 2022-11-19 MED ORDER — GABAPENTIN 300 MG PO CAPS
600.0000 mg | ORAL_CAPSULE | Freq: Two times a day (BID) | ORAL | Status: DC
  Administered 2022-11-19 – 2022-11-20 (×3): 600 mg via ORAL
  Filled 2022-11-19 (×3): qty 2

## 2022-11-19 MED ORDER — METOPROLOL TARTRATE 25 MG PO TABS
25.0000 mg | ORAL_TABLET | Freq: Two times a day (BID) | ORAL | Status: DC
  Administered 2022-11-19: 25 mg via ORAL
  Filled 2022-11-19: qty 1

## 2022-11-19 MED ORDER — ACETAMINOPHEN 325 MG PO TABS
650.0000 mg | ORAL_TABLET | Freq: Four times a day (QID) | ORAL | Status: DC | PRN
  Administered 2022-11-19: 650 mg via ORAL
  Filled 2022-11-19: qty 2

## 2022-11-19 MED ORDER — B COMPLEX VITAMINS PO CAPS: 1.0000 | ORAL_CAPSULE | Freq: Every day | ORAL | Status: DC

## 2022-11-19 MED ORDER — ISOSORBIDE MONONITRATE ER 60 MG PO TB24
60.0000 mg | ORAL_TABLET | Freq: Every day | ORAL | Status: DC
  Administered 2022-11-19 – 2022-11-20 (×2): 60 mg via ORAL
  Filled 2022-11-19 (×2): qty 1

## 2022-11-19 MED ORDER — HYDRALAZINE HCL 50 MG PO TABS
50.0000 mg | ORAL_TABLET | Freq: Three times a day (TID) | ORAL | Status: DC
  Administered 2022-11-19 – 2022-11-20 (×3): 50 mg via ORAL
  Filled 2022-11-19 (×3): qty 1

## 2022-11-19 MED ORDER — VITAMIN C 500 MG PO TABS
2000.0000 mg | ORAL_TABLET | Freq: Every day | ORAL | Status: DC
  Administered 2022-11-19 – 2022-11-20 (×2): 2000 mg via ORAL
  Filled 2022-11-19 (×2): qty 4

## 2022-11-19 MED ORDER — PIPERACILLIN-TAZOBACTAM 3.375 G IVPB
3.3750 g | Freq: Three times a day (TID) | INTRAVENOUS | Status: DC
  Administered 2022-11-19 – 2022-11-20 (×3): 3.375 g via INTRAVENOUS
  Filled 2022-11-19 (×4): qty 50

## 2022-11-19 MED ORDER — PANTOPRAZOLE SODIUM 40 MG IV SOLR
40.0000 mg | Freq: Once | INTRAVENOUS | Status: AC
  Administered 2022-11-19: 40 mg via INTRAVENOUS
  Filled 2022-11-19: qty 10

## 2022-11-19 MED ORDER — ASPIRIN 81 MG PO CHEW
324.0000 mg | CHEWABLE_TABLET | Freq: Once | ORAL | Status: AC
  Administered 2022-11-19: 324 mg via ORAL
  Filled 2022-11-19: qty 4

## 2022-11-19 MED ORDER — DIPHENHYDRAMINE HCL 50 MG/ML IJ SOLN
12.5000 mg | Freq: Three times a day (TID) | INTRAMUSCULAR | Status: DC | PRN
  Administered 2022-11-19 – 2022-11-20 (×2): 12.5 mg via INTRAVENOUS
  Filled 2022-11-19 (×2): qty 1

## 2022-11-19 MED ORDER — TRAMADOL HCL 50 MG PO TABS: 50.0000 mg | ORAL_TABLET | Freq: Four times a day (QID) | ORAL | Status: DC | PRN

## 2022-11-19 MED ORDER — ASPIRIN 81 MG PO TBEC
81.0000 mg | DELAYED_RELEASE_TABLET | Freq: Every day | ORAL | Status: DC
  Administered 2022-11-20: 81 mg via ORAL
  Filled 2022-11-19: qty 1

## 2022-11-19 MED ORDER — CLONIDINE HCL 0.1 MG PO TABS
0.3000 mg | ORAL_TABLET | Freq: Two times a day (BID) | ORAL | Status: DC
  Administered 2022-11-19 – 2022-11-20 (×3): 0.3 mg via ORAL
  Filled 2022-11-19 (×3): qty 3

## 2022-11-19 MED ORDER — METOCLOPRAMIDE HCL 5 MG/ML IJ SOLN
10.0000 mg | Freq: Once | INTRAMUSCULAR | Status: AC
  Administered 2022-11-19: 10 mg via INTRAVENOUS
  Filled 2022-11-19: qty 2

## 2022-11-19 MED ORDER — DULOXETINE HCL 20 MG PO CPEP
40.0000 mg | ORAL_CAPSULE | Freq: Every day | ORAL | Status: DC
  Administered 2022-11-19 – 2022-11-20 (×2): 40 mg via ORAL
  Filled 2022-11-19 (×2): qty 2

## 2022-11-19 MED ORDER — NITROGLYCERIN 0.4 MG SL SUBL
0.4000 mg | SUBLINGUAL_TABLET | SUBLINGUAL | Status: AC | PRN
  Administered 2022-11-19 (×3): 0.4 mg via SUBLINGUAL
  Filled 2022-11-19: qty 1

## 2022-11-19 MED ORDER — SODIUM CHLORIDE 0.9 % IV SOLN: INTRAVENOUS | Status: DC

## 2022-11-19 MED ORDER — ATORVASTATIN CALCIUM 20 MG PO TABS
40.0000 mg | ORAL_TABLET | Freq: Every evening | ORAL | Status: DC
  Administered 2022-11-19: 40 mg via ORAL
  Filled 2022-11-19: qty 2

## 2022-11-19 MED ORDER — LOSARTAN POTASSIUM 50 MG PO TABS
100.0000 mg | ORAL_TABLET | Freq: Every day | ORAL | Status: DC
  Administered 2022-11-19 – 2022-11-20 (×2): 100 mg via ORAL
  Filled 2022-11-19 (×2): qty 2

## 2022-11-19 MED ORDER — POTASSIUM CHLORIDE 10 MEQ/100ML IV SOLN
10.0000 meq | INTRAVENOUS | Status: AC
  Administered 2022-11-19 (×2): 10 meq via INTRAVENOUS
  Filled 2022-11-19 (×2): qty 100

## 2022-11-19 MED ORDER — SODIUM CHLORIDE 0.9 % IV BOLUS
1000.0000 mL | Freq: Once | INTRAVENOUS | Status: AC
  Administered 2022-11-19: 1000 mL via INTRAVENOUS

## 2022-11-19 MED ORDER — ALLOPURINOL 300 MG PO TABS
150.0000 mg | ORAL_TABLET | Freq: Every day | ORAL | Status: DC
  Administered 2022-11-19 – 2022-11-20 (×2): 150 mg via ORAL
  Filled 2022-11-19 (×2): qty 0.5

## 2022-11-19 MED ORDER — AMLODIPINE BESYLATE 5 MG PO TABS: 10.0000 mg | ORAL_TABLET | Freq: Every day | ORAL | Status: DC

## 2022-11-19 MED ORDER — CARVEDILOL 6.25 MG PO TABS
12.5000 mg | ORAL_TABLET | Freq: Two times a day (BID) | ORAL | Status: DC
  Administered 2022-11-19 – 2022-11-20 (×2): 12.5 mg via ORAL
  Filled 2022-11-19 (×2): qty 2

## 2022-11-19 MED ORDER — MORPHINE SULFATE (PF) 4 MG/ML IV SOLN: 4.0000 mg | Freq: Once | INTRAVENOUS | Status: DC

## 2022-11-19 MED ORDER — HYDROMORPHONE HCL 1 MG/ML IJ SOLN
0.5000 mg | Freq: Once | INTRAMUSCULAR | Status: AC
  Administered 2022-11-19: 0.5 mg via INTRAVENOUS
  Filled 2022-11-19: qty 0.5

## 2022-11-19 MED ORDER — POTASSIUM CHLORIDE CRYS ER 20 MEQ PO TBCR
40.0000 meq | EXTENDED_RELEASE_TABLET | Freq: Once | ORAL | Status: AC
  Administered 2022-11-19: 40 meq via ORAL
  Filled 2022-11-19: qty 2

## 2022-11-19 MED ORDER — NITROGLYCERIN IN D5W 200-5 MCG/ML-% IV SOLN
0.0000 ug/min | INTRAVENOUS | Status: DC
  Administered 2022-11-19: 5 ug/min via INTRAVENOUS
  Filled 2022-11-19 (×2): qty 250

## 2022-11-19 MED ORDER — ALBUTEROL SULFATE (2.5 MG/3ML) 0.083% IN NEBU: 3.0000 mL | INHALATION_SOLUTION | RESPIRATORY_TRACT | Status: DC | PRN

## 2022-11-19 MED ORDER — HYDRALAZINE HCL 20 MG/ML IJ SOLN
10.0000 mg | Freq: Once | INTRAMUSCULAR | Status: AC
  Administered 2022-11-19: 10 mg via INTRAVENOUS
  Filled 2022-11-19: qty 1

## 2022-11-19 NOTE — Consult Note (Addendum)
Cardiology Consultation   Patient ID: Steve Buck. MRN: [id number]; DOB: [birth year]  Admit date: 11/19/2022 Date of Consult: 11/19/2022  PCP:  Kirk Ruths, MD   [facility] Cardiologist:  Kathlyn Sacramento, MD        Patient Profile:   Steve Buck. is a 66 y.o. male with a hx of lone A-fib 2019, hypertension, CKD 3 who is being seen 11/19/2022 for the evaluation of chest pain, hypertension at the request of Dr. Blaine Hamper.  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  Patient was his usual self until yesterday morning when he noticed epigastric chest tightness with radiation to his chest.  He checked his blood pressure noted to be in the [id number] systolic, alerted son who brought him to the hospital.  In the ED upon admit, EKG showed sinus tachycardia, chest CT was negative for PE or aortic dissection.  Diagnosed with hypertensive urgency, started on nitro drip.  He states feeling much better since nitro drip was started.  Denies chest pain.  Systolic blood pressures upon my exam 153.  Troponins were 75, 102, 102.  He has a loop recorder, has not been monitored over the past year, remote monitoring device at home not working.    Past Medical History:  Diagnosis Date   (HFpEF) heart failure with preserved ejection fraction ([place])    a. 12/2017 Echo: EF 60-65%, no rwma, Gr1DD, mild MR, mildly dil LA, nl RV fxn.   Acute respiratory failure with hypoxia ([place]) 07/13/2022   Allergy    Seasonal   Chest pain    a. 12/2017 Lexiscan MV: EF 37% (nl by echo). Small mild, fixed region of apical thinning, likely attenuation artifact. No ischemia. Low risk.   CKD (chronic kidney disease) stage 3, GFR 30-59 ml/min (HCC) 03/30/2018   Diabetic nephropathy associated  with type 2 diabetes mellitus ([place]) 03/25/2018   DKA (diabetic ketoacidoses) 12/01/2017   Heart murmur    a. 12/2017 Echo: Mild MR.   History of Bell's palsy 03/30/2018   Hypertension    Morbid obesity ([place])    Neuropathy 01/31/2014   NSTEMI (non-ST elevated myocardial infarction) ([place]) 11/19/2022   Palpitations    PVC's (premature ventricular contractions)    a. 05/2018 Holter: RSR, avg rate of 60. Freq PVC's (6.4% burden). Some ventricular bigeminy/trigeminy.   Sepsis ([place]) 07/13/2022   Type 2 diabetes mellitus, uncontrolled, with neuropathy     Past Surgical History:  Procedure Laterality Date   colonoscvopy  2015   FOOT SURGERY  around [place] N/A 07/08/2018   Procedure: LOOP RECORDER INSERTION;  Surgeon: Deboraha Sprang, MD;  Location: [facility];  Service: Cardiovascular;  Laterality: N/A;     Home Medications:  Prior to Admission medications   Medication Sig Start Date End Date Taking? Authorizing Provider  acetaminophen (TYLENOL) 325 MG tablet Take 2 tablets (650 mg total) by mouth every 6 (six) hours as needed for mild pain, fever or headache. 07/17/22  Yes Nicole Kindred A, DO  allopurinol (ZYLOPRIM) 300 MG tablet Take 0.5 tablets (150 mg total) by mouth daily. 03/17/19  Yes Tukov-Yual, Magdalene S, NP  ascorbic acid (VITAMIN C) 500 MG tablet Take 2,000 mg by mouth daily.   Yes [provider]  atorvastatin (LIPITOR) 40 MG tablet Take 40 mg by mouth every evening.   Yes [provider]  b complex vitamins capsule Take 1 capsule by mouth daily.   Yes [provider]  busPIRone (BUSPAR) 15 MG tablet Take 15 mg by mouth 2 (two) times daily.   Yes [provider]  cloNIDine (CATAPRES) 0.3 MG tablet Take 1 tablet (0.3 mg total) by mouth 2 (two) times daily. 02/03/19  Yes Tukov-Yual, Magdalene S, NP  DULoxetine (CYMBALTA) 20 MG capsule Take 40 mg by mouth daily.   Yes [provider]  fluticasone (FLONASE)  50 MCG/ACT nasal spray Place 2 sprays into both nostrils daily. 07/18/22  Yes Nicole Kindred A, DO  gabapentin (NEURONTIN) 300 MG capsule Take 1 capsule (300 mg total) by mouth 3 (three) times daily. Home med. Patient taking differently: Take 600 mg by mouth 2 (two) times daily. 06/16/21  Yes Enzo Bi, MD  hydrALAZINE (APRESOLINE) 50 MG tablet Take 50 mg by mouth 2 (two) times daily.   Yes [provider]  loratadine (CLARITIN) 10 MG tablet Take 1 tablet (10 mg total) by mouth daily. 07/18/22  Yes Nicole Kindred A, DO  losartan (COZAAR) 100 MG tablet Take 1 tablet (100 mg total) by mouth daily. 07/17/22 07/17/23 Yes Nicole Kindred A, DO  metoprolol tartrate (LOPRESSOR) 25 MG tablet TAKE 1 TABLET BY MOUTH TWICE A DAY 06/08/20  Yes Deboraha Sprang, MD  amLODipine (NORVASC) 10 MG tablet Take 1 tablet (10 mg total) by mouth daily. Patient not taking: Reported on 11/19/2022 07/18/22   Nicole Kindred A, DO  chlorpheniramine-HYDROcodone (TUSSIONEX) 10-8 MG/5ML Take 5 mLs by mouth every 12 (twelve) hours as needed for cough. Patient not taking: Reported on 11/19/2022 07/17/22   Nicole Kindred A, DO  cholecalciferol (VITAMIN D3) 25 MCG (1000 UNIT) tablet Take 1,000 Units by mouth daily. Patient not taking: Reported on 11/19/2022    [provider]  glucose blood test strip 1 each by Other route 3 (three) times daily. Use as instructed 02/03/19   Tukov-Yual, Arlyss Gandy, NP  spironolactone (ALDACTONE) 25 MG tablet Take 25 mg by mouth daily. Patient not taking: Reported on 11/19/2022 09/30/22 09/30/23  [provider]  traMADol (ULTRAM) 50 MG tablet Take 50 mg by mouth every 6 (six) hours as needed for moderate pain. Patient not taking: Reported on 11/19/2022 09/30/22   [provider]    Inpatient Medications: Scheduled Meds:  allopurinol  150 mg Oral Daily   ascorbic acid  2,000 mg Oral Daily   [START ON 11/20/2022] aspirin EC  81 mg Oral Daily   atorvastatin  40 mg Oral QPM    [START ON 11/20/2022] B-complex with vitamin C  1 tablet Oral Daily   busPIRone  15 mg Oral BID   cloNIDine  0.3 mg Oral BID   DULoxetine  40 mg Oral Daily   gabapentin  600 mg Oral BID   heparin  5,000 Units Subcutaneous Q8H   hydrALAZINE  50 mg Oral TID   isosorbide mononitrate  60 mg Oral Daily   losartan  100 mg Oral Daily   metoprolol tartrate  25 mg Oral BID   Continuous Infusions:  sodium chloride 75 mL/hr at 11/19/22 0907   nitroGLYCERIN 10 mcg/min (11/19/22 1145)   sodium chloride     PRN Meds: acetaminophen, albuterol, diphenhydrAMINE, morphine injection, traMADol  Allergies:    Allergies  Allergen Reactions   Lisinopril Cough and Rash   Metformin Nausea Only  Social History:   Social History   Socioeconomic History   Marital status: Married    Spouse name: Not on file   Number of children: 3   Years of education: Not on file   Highest education level: Not on file  Occupational History   Occupation: retired    Comment: Delivery driver-produce  Tobacco Use   Smoking status: Never   Smokeless tobacco: Never  Vaping Use   Vaping Use: Never used  Substance and Sexual Activity   Alcohol use: Not Currently   Drug use: Yes    Types: Marijuana    Comment: occasionally   Sexual activity: Not on file  Other Topics Concern   Not on file  Social History Narrative   Pt said that everything was going well.    Said the main stress for him is paying for medical care, but things are going well here.   No alcohol use noted.  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   Social Determinants of Health   Financial Resource Strain: Medium Risk (03/25/2018)   Overall Financial Resource Strain (CARDIA)    Difficulty of Paying Living Expenses: Somewhat hard  Food Insecurity: No Food Insecurity (03/25/2018)   Hunger Vital Sign    Worried About Running Out of Food in the Last Year: Never true    Ran Out of Food in the Last Year: Never true  Transportation  Needs: No Transportation Needs (03/25/2018)   PRAPARE - Hydrologist (Medical): No    Lack of Transportation (Non-Medical): No  Physical Activity: Insufficiently Active (03/25/2018)   Exercise Vital Sign    Days of Exercise per Week: 5 days    Minutes of Exercise per Session: 20 min  Stress: Stress Concern Present (03/25/2018)   [place]    Feeling of Stress : To some extent  Social Connections: Moderately Integrated (03/25/2018)   Social Connection and Isolation Panel [NHANES]    Frequency of Communication with Friends and Family: Three times a week    Frequency of Social Gatherings with Friends and Family: Twice a week    Attends Religious Services: More than 4 times per year    Active Member of [employer] or Organizations: No    Attends Archivist Meetings: Never    Marital Status: Married  Human resources officer Violence: Not At Risk (03/25/2018)   Humiliation, Afraid, Rape, and Kick questionnaire    Fear of Current or Ex-Partner: No    Emotionally Abused: No    Physically Abused: No    Sexually Abused: No    Family History:    Family History  Problem Relation Age of Onset   Cancer Mother        Skin   CAD Father    Arthritis Father      ROS:  Please see the history of present illness.   All other ROS reviewed and negative.     Physical Exam/Data:   Vitals:   11/19/22 1111 11/19/22 1115 11/19/22 1130 11/19/22 1145  BP:  (!) 125/104 117/80 123/71  Pulse: 82 75 69 (!) 57  Resp: (!) '25 15 20 20  '$ Temp:      TempSrc:      SpO2: 97% 94% 97% 93%  Weight:      Height:       No intake or output data in the 24 hours ending 11/19/22 1314    11/19/2022  2:20 AM 07/13/2022    5:13 PM 06/15/2021    5:56 PM  Last 3 Weights  Weight (lbs) 275 lb 279 lb 12.2 oz 240 lb 4.8 oz  Weight (kg) 124.739 kg 126.9 kg 109 kg     Body mass index is 36.28 kg/m.  General:  Well nourished,  well developed, in no acute distress HEENT: normal Neck: no JVD Vascular: No carotid bruits; Distal pulses 2+ bilaterally Cardiac:  normal S1, S2; RRR; no murmur  Lungs:  clear to auscultation bilaterally, no wheezing, rhonchi or rales  Abd: soft, nontender, no hepatomegaly  Ext: no edema Musculoskeletal:  No deformities, BUE and BLE strength normal and equal Skin: warm and dry  Neuro:  CNs 2-12 intact, no focal abnormalities noted Psych:  Normal affect   EKG:  The EKG was personally reviewed and demonstrates: Sinus tachycardia Telemetry:  Telemetry was personally reviewed and demonstrates: Sinus rhythm  Relevant CV Studies:  TTE 2022  1. Left ventricular ejection fraction, by estimation, is 60 to 65%. The  left ventricle has normal function. The left ventricle has no regional  wall motion abnormalities. Left ventricular diastolic parameters were  normal.   2. Right ventricular systolic function is normal. The right ventricular  size is normal.   3. Left atrial size was mildly dilated.   4. The mitral valve is normal in structure. Mild mitral valve  regurgitation.   5. The aortic valve is normal in structure. Aortic valve regurgitation is  not visualized.   Laboratory Data:  High Sensitivity Troponin:   Recent Labs  Lab 11/19/22 0253 11/19/22 0402 11/19/22 0616 11/19/22 0845 11/19/22 1126  TROPONINIHS 61* 75* 102* 102* 107*     Chemistry Recent Labs  Lab 11/19/22 0402 11/19/22 0410  NA  --  140  K  --  2.9*  CL  --  103  CO2  --  24  GLUCOSE  --  188*  BUN  --  24*  CREATININE  --  1.63*  CALCIUM  --  9.5  MG 1.8  --   GFRNONAA  --  46*  ANIONGAP  --  13    Recent Labs  Lab 11/19/22 0410  PROT 7.9  ALBUMIN 4.2  AST 31  ALT 25  ALKPHOS 95  BILITOT 1.4*   Lipids No results for input(s): "CHOL", "TRIG", "HDL", "LABVLDL", "LDLCALC", "CHOLHDL" in the last 168 hours.  Hematology Recent Labs  Lab 11/19/22 0253  WBC 15.4*  15.4*  RBC 5.46  5.43   HGB 16.8  16.9  HCT 50.0  49.8  MCV 91.6  91.7  MCH 30.8  31.1  MCHC 33.6  33.9  RDW 15.6*  15.6*  PLT 396  377   Thyroid No results for input(s): "TSH", "FREET4" in the last 168 hours.  BNP Recent Labs  Lab 11/19/22 0253  BNP 212.6*    DDimer No results for input(s): "DDIMER" in the last 168 hours.   Radiology/Studies:  US ABDOMEN LIMITED RUQ (LIVER/GB)  Result Date: 11/19/2022 CLINICAL DATA:  67 year old male with history of upper abdominal pain. EXAM: ULTRASOUND ABDOMEN LIMITED RIGHT UPPER QUADRANT COMPARISON:  No priors. FINDINGS: Gallbladder: There is some amorphous echogenic but nonshadowing material lying dependently in the gallbladder, compatible with biliary sludge. Some well organized sludge balls are noted measuring up to 3 mm. Gallbladder is only moderately distended with normal wall thickness of 3 mm. No pericholecystic fluid. Per report from the sonographer, there was no sonographic Murphy's sign on examination. Common  bile duct: Diameter: 6.2 mm. Liver: No focal lesion identified. Within normal limits in parenchymal echogenicity. Portal vein is patent on color Doppler imaging with normal direction of blood flow towards the liver. Other: None. IMPRESSION: 1. Study is positive for biliary sludge in the gallbladder including small sludge balls, but no findings to suggest an acute cholecystitis at this time. Electronically Signed   By: Vinnie Langton M.D.   On: 11/19/2022 07:48   CT Angio Chest/Abd/Pel for Dissection W and/or Wo Contrast  Result Date: 11/19/2022 CLINICAL DATA:  67 year old male with severe chest and epigastric abdominal pain with vomiting. EXAM: CT ANGIOGRAPHY CHEST, ABDOMEN AND PELVIS TECHNIQUE: Non-contrast CT of the chest was initially obtained. Multidetector CT imaging through the chest, abdomen and pelvis was performed using the standard protocol during bolus administration of intravenous contrast. Multiplanar reconstructed images and MIPs were  obtained and reviewed to evaluate the vascular anatomy. RADIATION DOSE REDUCTION: This exam was performed according to the departmental dose-optimization program which includes automated exposure control, adjustment of the mA and/or kV according to patient size and/or use of iterative reconstruction technique. CONTRAST:  19m OMNIPAQUE IOHEXOL 350 MG/ML SOLN COMPARISON:  Portable chest 0230 hours today. CTA chest 07/13/2022. FINDINGS: CTA CHEST FINDINGS Cardiovascular: Calcified coronary artery plaque or stents (series 3, image 31). Only trace thoracic aortic calcified plaque. Cardiac size within normal limits. No pericardial effusion. Following contrast both the thoracic aorta and main pulmonary arteries are opacified. No evidence of central or lobar pulmonary embolus. No thoracic aortic aneurysm or dissection. Unremarkable proximal great vessels. Mediastinum/Nodes: Negative. No mediastinal mass or lymphadenopathy. Lungs/Pleura: Major airways are patent. Stable to improved lung volumes compared to last year. Resolved multifocal right lung airspace disease since the prior CTA. Minor dependent atelectasis and/or scarring. No pleural effusion or acute lung finding. Musculoskeletal: Widespread thoracic interbody ankylosis from flowing endplate osteophytes. Stable mildly exaggerated thoracic kyphosis. No acute or suspicious osseous lesion identified. Chronic left AC joint degeneration. Review of the MIP images confirms the above findings. CTA ABDOMEN AND PELVIS FINDINGS VASCULAR Mildly tortuous abdominal aorta and iliac arteries with Aortoiliac calcified atherosclerosis. But negative for abdominal aortic aneurysm or dissection. Major aortic branches remain patent with no high-grade stenosis. Early contrast timing in the pelvis, grossly patent major iliac arteries. Review of the MIP images confirms the above findings. NON-VASCULAR Hepatobiliary: Negative liver and gallbladder. Pancreas: Negative. Spleen: Negative.  Adrenals/Urinary Tract: Negative adrenal glands. Exophytic but simple fluid density left renal midpole cyst (no follow-up imaging recommended). Nonobstructed kidneys with symmetric renal enhancement. Smaller exophytic right renal cysts also appear simple (no follow-up imaging recommended). Decompressed ureters. Decompressed bladder. Mild pelvic phleboliths. Stomach/Bowel: Redundant large bowel but only mild retained stool. Descending and transverse diverticulosis is mild to moderate. No active inflammation. Normal retrocecal appendix on series 6, image 216. No dilated small bowel. Mostly decompressed stomach and duodenum. No free air or free fluid identified. Lymphatic: No lymphadenopathy. Reproductive: Negative. Other: No pelvis free fluid. Musculoskeletal: L4-L5 mild spondylolisthesis with lumbar disc and severe facet degeneration with evidence of gas containing synovial cysts larger on the right (series 6, image 220). Associated spinal stenosis, neural impingement at that level. No acute osseous abnormality identified. Review of the MIP images confirms the above findings. IMPRESSION: 1. Negative for aortic aneurysm or dissection. Positive for coronary artery disease, Aortic Atherosclerosis (ICD10-I70.0). 2. No acute or inflammatory process identified in the chest, abdomen, or pelvis. Resolved right lung Pneumonia since last year. 3. Advanced lumbar spine facet arthropathy with synovial cysts  and multifactorial spinal stenosis, neural impingement at L4-L5. Electronically Signed   By: Genevie Ann M.D.   On: 11/19/2022 05:49   DG Chest 1 View  Result Date: 11/19/2022 CLINICAL DATA:  Chest and abdominal pain EXAM: PORTABLE CHEST 1 VIEW COMPARISON:  07/13/2022 FINDINGS: Cardiac shadow is stable. Loop recorder is again noted. Aortic calcifications are seen. Lungs are clear. No bony abnormality is noted. IMPRESSION: No active disease. Electronically Signed   By: Inez Catalina M.D.   On: 11/19/2022 02:36     Assessment  and Plan:   Chest pain, elevated troponins, history of nonobstructive CAD. -Troponin elevation represents demand supply mismatch in the context of underlying CAD and hypertension.  Peak troponin 102. -Chest pain resolved with nitroglycerin -Start Imdur 60 mg daily, titrate off nitro drip. -Continue aspirin, Lipitor.  Repeat echo. -also Avoiding left heart cath/contrast if possible due to CKD 3.  2.  Hypertension -Increase hydralazine to 50 mg 3 times daily.  Start Imdur 60 mg daily. -Switch Lopressor to carvedilol 12.5 mg bid.  For better BP control and antianginal benefit -Continue losartan 100 mg daily, -Optimize current BP meds, titrate off clonidine if possible. -Titrate off nitroglycerin drip.  3.  History of lone A-fib. -S/p ILR, no evidence for A-fib recurrence -Xarelto previously stopped by EP. -Remote monitoring device at home not working. -Outpatient follow-up with EP/A-fib clinic clinic.  Patient request Dr. Quentin Ore.   Total encounter time more than 85 minutes  Greater than 50% was spent in counseling and coordination of care with the patient   Signed, Kate Sable, MD  11/19/2022 1:14 PM

## 2022-11-19 NOTE — ED Triage Notes (Addendum)
Pt reports abd pain with dry heaves since yesterday.  Pt diaphoretic.  No cough  pt alert  speech clear.  Pt taken to room 9.

## 2022-11-19 NOTE — ED Notes (Signed)
Patient transported to CT 

## 2022-11-19 NOTE — ED Notes (Signed)
Dr. Ward at bedside at this time.  

## 2022-11-19 NOTE — ED Provider Notes (Signed)
[facility] Provider Note    Event Date/Time   First MD Initiated Contact with Patient 11/19/22 [phone]     (approximate)   History   Abdominal Pain   HPI  Steve Buck. is a 67 y.o. male with history of hypertension, diabetes, chronic kidney disease, CHF who presents to the emergency department with complaints of 2 days of dry heaving and now having severe epigastric pain that radiates up into the chest.  He has had diaphoresis as well.  No shortness of breath.  He attributes his pain to dry heaving so much.  He states 2 days ago he did have some right lower quadrant pain and thought he could have appendicitis but this is now gone.  No previous abdominal surgeries.  No dysuria, hematuria or difficulty urinating.  No previous history of kidney stones.  Reports having normal bowel movements without blood or melena.   History provided by patient, son.    Past Medical History:  Diagnosis Date   (HFpEF) heart failure with preserved ejection fraction ([place])    a. 12/2017 Echo: EF 60-65%, no rwma, Gr1DD, mild MR, mildly dil LA, nl RV fxn.   Acute respiratory failure with hypoxia ([place]) 07/13/2022   Allergy    Seasonal   Chest pain    a. 12/2017 Lexiscan MV: EF 37% (nl by echo). Small mild, fixed region of apical thinning, likely attenuation artifact. No ischemia. Low risk.   CKD (chronic kidney disease) stage 3, GFR 30-59 ml/min (HCC) 03/30/2018   Diabetic nephropathy associated with type 2 diabetes mellitus ([place]) 03/25/2018   DKA (diabetic ketoacidoses) 12/01/2017   Heart murmur    a. 12/2017 Echo: Mild MR.   History of Bell's palsy 03/30/2018   Hypertension    Morbid obesity ([place])    Neuropathy 01/31/2014   Palpitations    PVC's (premature ventricular contractions)    a. 05/2018 Holter: RSR, avg rate of 60. Freq PVC's (6.4% burden). Some ventricular bigeminy/trigeminy.   Sepsis ([place]) 07/13/2022   Type 2 diabetes mellitus, uncontrolled, with neuropathy      Past Surgical History:  Procedure Laterality Date   colonoscvopy  2015   FOOT SURGERY  around [place] N/A 07/08/2018   Procedure: LOOP RECORDER INSERTION;  Surgeon: Deboraha Sprang, MD;  Location: [place] [facility];  Service: Cardiovascular;  Laterality: N/A;    MEDICATIONS:  Prior to Admission medications   Medication Sig Start Date End Date Taking? Authorizing Provider  acetaminophen (TYLENOL) 325 MG tablet Take 2 tablets (650 mg total) by mouth every 6 (six) hours as needed for mild pain, fever or headache. 07/17/22   Ezekiel Slocumb, DO  allopurinol (ZYLOPRIM) 300 MG tablet Take 0.5 tablets (150 mg total) by mouth daily. 03/17/19   Tukov-Yual, Arlyss Gandy, NP  amLODipine (NORVASC) 10 MG tablet Take 1 tablet (10 mg total) by mouth daily. 07/18/22   Ezekiel Slocumb, DO  ascorbic acid (VITAMIN C) 500 MG tablet Take 2,000 mg by mouth daily.    [provider]  atorvastatin (LIPITOR) 40 MG tablet Take 40 mg by mouth every evening.    [provider]  b complex vitamins capsule Take 1 capsule by mouth daily.    [provider]  busPIRone (BUSPAR) 15 MG tablet Take 15 mg by mouth 2 (two) times daily.    [provider]  chlorpheniramine-HYDROcodone (TUSSIONEX) 10-8 MG/5ML Take 5 mLs by mouth every 12 (twelve) hours as needed  for cough. 07/17/22   Ezekiel Slocumb, DO  cholecalciferol (VITAMIN D3) 25 MCG (1000 UNIT) tablet Take 1,000 Units by mouth daily.    [provider]  cloNIDine (CATAPRES) 0.3 MG tablet Take 1 tablet (0.3 mg total) by mouth 2 (two) times daily. 02/03/19   Tukov-Yual, Arlyss Gandy, NP  DULoxetine (CYMBALTA) 20 MG capsule Take 40 mg by mouth daily.    [provider]  fluticasone (FLONASE) 50 MCG/ACT nasal spray Place 2 sprays into both nostrils daily. 07/18/22   Nicole Kindred A, DO  gabapentin (NEURONTIN) 300 MG capsule Take 1 capsule (300 mg total) by mouth 3 (three) times daily. Home  med. Patient taking differently: Take 600 mg by mouth 2 (two) times daily. 06/16/21   Enzo Bi, MD  glucose blood test strip 1 each by Other route 3 (three) times daily. Use as instructed 02/03/19   Tukov-Yual, Arlyss Gandy, NP  hydrALAZINE (APRESOLINE) 50 MG tablet Take 50 mg by mouth 2 (two) times daily.    [provider]  loratadine (CLARITIN) 10 MG tablet Take 1 tablet (10 mg total) by mouth daily. 07/18/22   Ezekiel Slocumb, DO  losartan (COZAAR) 100 MG tablet Take 1 tablet (100 mg total) by mouth daily. 07/17/22 07/17/23  Nicole Kindred A, DO  metoprolol tartrate (LOPRESSOR) 25 MG tablet TAKE 1 TABLET BY MOUTH TWICE A DAY 06/08/20   Deboraha Sprang, MD    Physical Exam   Triage Vital Signs: ED Triage Vitals  Enc Vitals Group     BP 11/19/22 0219 (!) 196/138     Pulse Rate 11/19/22 0219 (!) 123     Resp 11/19/22 0219 (!) 24     Temp 11/19/22 0219 98 F (36.7 C)     Temp src --      SpO2 11/19/22 0219 95 %     Weight 11/19/22 0220 275 lb (124.7 kg)     Height 11/19/22 0220 '6\' 1"'$  (1.854 m)     Head Circumference --      Peak Flow --      Pain Score 11/19/22 0220 10     Pain Loc --      Pain Edu? --      Excl. in [place]? --     Most recent vital signs: Vitals:   11/19/22 0630 11/19/22 0634  BP: (!) 144/112   Pulse: (!) 130 (!) 119  Resp: 19   Temp:    SpO2: 96% 94%    CONSTITUTIONAL: Alert, responds appropriately to questions.  Pleasant, conversational, diaphoretic HEAD: Normocephalic, atraumatic EYES: Conjunctivae clear, pupils appear equal, sclera nonicteric ENT: normal nose; moist mucous membranes NECK: Supple, normal ROM CARD: Regular and tachycardic; S1 and S2 appreciated RESP: Normal chest excursion without splinting or tachypnea; breath sounds clear and equal bilaterally; no wheezes, no rhonchi, no rales, no hypoxia or respiratory distress, speaking full sentences ABD/GI: Non-distended; soft, diffusely tender throughout the upper abdomen without guarding or  rebound BACK: The back appears normal EXT: Normal ROM in all joints; no deformity noted, no edema SKIN: Normal color for age and race; warm; no rash on exposed skin NEURO: Moves all extremities equally, normal speech PSYCH: The patient's mood and manner are appropriate.   ED Results / Procedures / Treatments   LABS: (all labs ordered are listed, but only abnormal results are displayed) Labs Reviewed  CBC - Abnormal; Notable for the following components:      Result Value   WBC 15.4 (*)  RDW 15.6 (*)    All other components within normal limits  CBC WITH DIFFERENTIAL/PLATELET - Abnormal; Notable for the following components:   WBC 15.4 (*)    RDW 15.6 (*)    Neutro Abs 9.2 (*)    Lymphs Abs 4.8 (*)    All other components within normal limits  COMPREHENSIVE METABOLIC PANEL - Abnormal; Notable for the following components:   Potassium 2.9 (*)    Glucose, Bld 188 (*)    BUN 24 (*)    Creatinine, Ser 1.63 (*)    Total Bilirubin 1.4 (*)    GFR, Estimated 46 (*)    All other components within normal limits  TROPONIN I (HIGH SENSITIVITY) - Abnormal; Notable for the following components:   Troponin I (High Sensitivity) 61 (*)    All other components within normal limits  TROPONIN I (HIGH SENSITIVITY) - Abnormal; Notable for the following components:   Troponin I (High Sensitivity) 75 (*)    All other components within normal limits  TROPONIN I (HIGH SENSITIVITY) - Abnormal; Notable for the following components:   Troponin I (High Sensitivity) 102 (*)    All other components within normal limits  LIPASE, BLOOD  MAGNESIUM  URINALYSIS, ROUTINE W REFLEX MICROSCOPIC     EKG:    Date: 11/19/2022 2:29 AM  Rate: 115  Rhythm: Sinus tachycardia  QRS Axis: normal  Intervals: QTc 514 ms  ST/T Wave abnormalities: normal  Conduction Disutrbances: none  Narrative Interpretation: Sinus tachycardia, LVH, prolonged QT interval     RADIOLOGY: My personal review and  interpretation of imaging: Chest x-ray clear.  CT of the chest abdomen pelvis shows no dissection.  Ultrasound shows no cholelithiasis.  I have personally reviewed all radiology reports.   US ABDOMEN LIMITED RUQ (LIVER/GB)  Result Date: 11/19/2022 CLINICAL DATA:  67 year old male with history of upper abdominal pain. EXAM: ULTRASOUND ABDOMEN LIMITED RIGHT UPPER QUADRANT COMPARISON:  No priors. FINDINGS: Gallbladder: There is some amorphous echogenic but nonshadowing material lying dependently in the gallbladder, compatible with biliary sludge. Some well organized sludge balls are noted measuring up to 3 mm. Gallbladder is only moderately distended with normal wall thickness of 3 mm. No pericholecystic fluid. Per report from the sonographer, there was no sonographic Murphy's sign on examination. Common bile duct: Diameter: 6.2 mm. Liver: No focal lesion identified. Within normal limits in parenchymal echogenicity. Portal vein is patent on color Doppler imaging with normal direction of blood flow towards the liver. Other: None. IMPRESSION: 1. Study is positive for biliary sludge in the gallbladder including small sludge balls, but no findings to suggest an acute cholecystitis at this time. Electronically Signed   By: Vinnie Langton M.D.   On: 11/19/2022 07:48   CT Angio Chest/Abd/Pel for Dissection W and/or Wo Contrast  Result Date: 11/19/2022 CLINICAL DATA:  67 year old male with severe chest and epigastric abdominal pain with vomiting. EXAM: CT ANGIOGRAPHY CHEST, ABDOMEN AND PELVIS TECHNIQUE: Non-contrast CT of the chest was initially obtained. Multidetector CT imaging through the chest, abdomen and pelvis was performed using the standard protocol during bolus administration of intravenous contrast. Multiplanar reconstructed images and MIPs were obtained and reviewed to evaluate the vascular anatomy. RADIATION DOSE REDUCTION: This exam was performed according to the departmental dose-optimization program  which includes automated exposure control, adjustment of the mA and/or kV according to patient size and/or use of iterative reconstruction technique. CONTRAST:  149m OMNIPAQUE IOHEXOL 350 MG/ML SOLN COMPARISON:  Portable chest 0230 hours today. CTA chest 07/13/2022.  FINDINGS: CTA CHEST FINDINGS Cardiovascular: Calcified coronary artery plaque or stents (series 3, image 31). Only trace thoracic aortic calcified plaque. Cardiac size within normal limits. No pericardial effusion. Following contrast both the thoracic aorta and main pulmonary arteries are opacified. No evidence of central or lobar pulmonary embolus. No thoracic aortic aneurysm or dissection. Unremarkable proximal great vessels. Mediastinum/Nodes: Negative. No mediastinal mass or lymphadenopathy. Lungs/Pleura: Major airways are patent. Stable to improved lung volumes compared to last year. Resolved multifocal right lung airspace disease since the prior CTA. Minor dependent atelectasis and/or scarring. No pleural effusion or acute lung finding. Musculoskeletal: Widespread thoracic interbody ankylosis from flowing endplate osteophytes. Stable mildly exaggerated thoracic kyphosis. No acute or suspicious osseous lesion identified. Chronic left AC joint degeneration. Review of the MIP images confirms the above findings. CTA ABDOMEN AND PELVIS FINDINGS VASCULAR Mildly tortuous abdominal aorta and iliac arteries with Aortoiliac calcified atherosclerosis. But negative for abdominal aortic aneurysm or dissection. Major aortic branches remain patent with no high-grade stenosis. Early contrast timing in the pelvis, grossly patent major iliac arteries. Review of the MIP images confirms the above findings. NON-VASCULAR Hepatobiliary: Negative liver and gallbladder. Pancreas: Negative. Spleen: Negative. Adrenals/Urinary Tract: Negative adrenal glands. Exophytic but simple fluid density left renal midpole cyst (no follow-up imaging recommended). Nonobstructed kidneys  with symmetric renal enhancement. Smaller exophytic right renal cysts also appear simple (no follow-up imaging recommended). Decompressed ureters. Decompressed bladder. Mild pelvic phleboliths. Stomach/Bowel: Redundant large bowel but only mild retained stool. Descending and transverse diverticulosis is mild to moderate. No active inflammation. Normal retrocecal appendix on series 6, image 216. No dilated small bowel. Mostly decompressed stomach and duodenum. No free air or free fluid identified. Lymphatic: No lymphadenopathy. Reproductive: Negative. Other: No pelvis free fluid. Musculoskeletal: L4-L5 mild spondylolisthesis with lumbar disc and severe facet degeneration with evidence of gas containing synovial cysts larger on the right (series 6, image 220). Associated spinal stenosis, neural impingement at that level. No acute osseous abnormality identified. Review of the MIP images confirms the above findings. IMPRESSION: 1. Negative for aortic aneurysm or dissection. Positive for coronary artery disease, Aortic Atherosclerosis (ICD10-I70.0). 2. No acute or inflammatory process identified in the chest, abdomen, or pelvis. Resolved right lung Pneumonia since last year. 3. 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 Electronically Signed   By: Genevie Ann M.D.   On: 11/19/2022 05:49   DG Chest 1 View  Result Date: 11/19/2022 CLINICAL DATA:  Chest and abdominal pain EXAM: PORTABLE CHEST 1 VIEW COMPARISON:  07/13/2022 FINDINGS: Cardiac shadow is stable. Loop recorder is again noted. Aortic calcifications are seen. Lungs are clear. No bony abnormality is noted. IMPRESSION: No active disease. Electronically Signed   By: Inez Catalina M.D.   On: 11/19/2022 02:36     PROCEDURES:  Critical Care performed: Yes, see critical care procedure note(s)   CRITICAL CARE Performed by: Cyril Mourning Quintin Hjort   Total critical care time: 45  minutes  Critical care time was exclusive of separately billable procedures and treating other patients.  Critical care was necessary to treat or prevent imminent or life-threatening deterioration.  Critical care was time spent personally by me on the following activities: development of treatment plan with patient and/or surrogate as well as nursing, discussions with consultants, evaluation of patient's response to treatment, examination of patient, obtaining history from patient or surrogate, ordering and performing treatments and interventions, ordering and review of laboratory studies, ordering and review of radiographic studies, pulse  oximetry and re-evaluation of patient's condition.   Marland Kitchen1-3 Lead EKG Interpretation  Performed by: Athalie Newhard, Delice Bison, DO Authorized by: Jilda Kress, Delice Bison, DO     Interpretation: abnormal     ECG rate:  123   ECG rate assessment: tachycardic     Rhythm: sinus tachycardia     Ectopy: none     Conduction: normal       IMPRESSION / MDM / ASSESSMENT AND PLAN / ED COURSE  I reviewed the triage vital signs and the nursing notes.    Patient here with severe chest and upper abdominal pain.  The patient is on the cardiac monitor to evaluate for evidence of arrhythmia and/or significant heart rate changes.   DIFFERENTIAL DIAGNOSIS (includes but not limited to):   ACS, PE, dissection, cholelithiasis, pancreatitis, GERD, gastritis, perforated ulcer, kidney stone, appendicitis, cholelithiasis, cholecystitis   Patient's presentation is most consistent with acute presentation with potential threat to life or bodily function.   PLAN: Will obtain CBC, CMP, lipase, troponin x 2, CT dissection study, urinalysis.  Will give IV fluids, Dilaudid, Zofran and Pepcid.   MEDICATIONS GIVEN IN ED: Medications  potassium chloride 10 mEq in 100 mL IVPB (10 mEq Intravenous New Bag/Given 11/19/22 0701)  nitroGLYCERIN 50 mg in dextrose 5 % 250 mL (0.2 mg/mL) infusion (5 mcg/min  Intravenous New Bag/Given 11/19/22 0717)  ondansetron (ZOFRAN) injection 4 mg (4 mg Intravenous Given 11/19/22 0306)  sodium chloride 0.9 % bolus 1,000 mL (0 mLs Intravenous Stopped 11/19/22 0605)  HYDROmorphone (DILAUDID) injection 0.5 mg (0.5 mg Intravenous Given 11/19/22 0309)  famotidine (PEPCID) IVPB 20 mg premix (0 mg Intravenous Stopped 11/19/22 0401)  hydrALAZINE (APRESOLINE) injection 10 mg (10 mg Intravenous Given 11/19/22 0520)  ondansetron (ZOFRAN) injection 4 mg (4 mg Intravenous Given 11/19/22 0517)  potassium chloride SA (KLOR-CON M) CR tablet 40 mEq (40 mEq Oral Given 11/19/22 0521)  iohexol (OMNIPAQUE) 350 MG/ML injection 100 mL (100 mLs Intravenous Contrast Given 11/19/22 0502)  pantoprazole (PROTONIX) injection 40 mg (40 mg Intravenous Given 11/19/22 0610)  alum & mag hydroxide-simeth (MAALOX/MYLANTA) 200-200-20 MG/5ML suspension 30 mL (30 mLs Oral Given 11/19/22 0617)  metoCLOPramide (REGLAN) injection 10 mg (10 mg Intravenous Given 11/19/22 0611)  HYDROmorphone (DILAUDID) injection 1 mg (1 mg Intravenous Given 11/19/22 0610)  aspirin chewable tablet 324 mg (324 mg Oral Given 11/19/22 0608)  nitroGLYCERIN (NITROSTAT) SL tablet 0.4 mg (0.4 mg Sublingual Given 11/19/22 0625)  HYDROmorphone (DILAUDID) injection 1 mg (1 mg Intravenous Given 11/19/22 0651)     ED COURSE: Patient's initial troponin is 61 and then has risen to 75 and then 102.  Multiple repeat EKG shows sinus tachycardia with LVH but no ischemic change.  He has slightly prolonged QT interval and potassium today 2.9.  Given IV and oral replacement.  Magnesium level is normal.  He has chronic kidney disease which is stable.  Normal LFTs and lipase.  He has a leukocytosis of 15,000 which appears chronic.  Chest x-ray, CT dissection study and right upper quadrant ultrasound all obtained, reviewed and interpreted by myself and the radiologist.  He has findings of coronary artery disease but no dissection, pneumonia, edema or cholecystitis.  I suspect  his symptoms today are secondary to hypertensive emergency but also could possibly be an NSTEMI however I feel like after 2 to 3 days of symptoms his troponin should be much higher than just 100.  Will give aspirin but hold heparin until the hospitalist has been able to see the patient.  Pain would intermittently improve here with Dilaudid but no significant improvement with multiple GI medications.  He was given nitroglycerin and started on nitroglycerin infusion and blood pressure has improved and is now pain-free.  Will discuss with hospitalist for admission for hypertensive emergency.   CONSULTS:  Consulted and discussed patient's case with hospitalist, Dr. Blaine Hamper.  I have recommended admission and consulting physician agrees and will place admission orders.  Patient (and family if present) agree with this plan.   I reviewed all nursing notes, vitals, pertinent previous records.  All labs, EKGs, imaging ordered have been independently reviewed and interpreted by myself.    OUTSIDE RECORDS REVIEWED: Reviewed last PCP note on 09/30/2022.       FINAL CLINICAL IMPRESSION(S) / ED DIAGNOSES   Final diagnoses:  Epigastric pain  Nausea and vomiting, unspecified vomiting type  Hypertensive emergency  Hypokalemia  NSTEMI (non-ST elevated myocardial infarction) ([place])     Rx / DC Orders   ED Discharge Orders     None        Note:  This document was prepared using Dragon voice recognition software and may include unintentional dictation errors.   Shandy Vi, Delice Bison, DO 11/19/22 [phone]

## 2022-11-19 NOTE — ED Notes (Signed)
Ward, MD, notifed regarding pt BP: 175 123

## 2022-11-19 NOTE — ED Notes (Signed)
MD Ward requests Nitro gtt be started at this time. Primary RN at pt bedside at this time and made aware

## 2022-11-19 NOTE — Consult Note (Incomplete)
Cardiology Consultation   Patient ID: Steve Buck. MRN: [id number]; DOB: [birth year]  Admit date: 11/19/2022 Date of Consult: 11/19/2022  PCP:  Kirk Ruths, MD   [facility] [place] Cardiologist:  Kathlyn Sacramento, MD   {   Patient Profile:   Steve Buck. is a 67 y.o. male with a hx of Afib, HTN, CKD, Bell's Palsey, HFpEF, PVCs, obesity who is being seen 11/19/2022 for the evaluation of *** at the request of Dr. Blaine Hamper.  History of Present Illness:   Steve Buck has been followed by Dr. Velva Harman Dr. Caryl Comes for the above cardiac issues.  2016 patient underwent evaluation by Dr. Nehemiah Massed for atypical chest pain he was supposed to get an echo and a stress but these tests were not performed.  Patient had an echocardiogram in April 2019 that showed normal LV function with grade 1 diastolic dysfunction.  He was referred to Dr. Fletcher Anon in 2019 for palpitations, chest pain, shortness of breath, swelling in his ankles.  At that time palpitations had resolved and no further testing was pursued.  Rest test was nonischemic and echo showed normal LV function.  Later that year, heart monitor was pursued for palpitations.  Heart monitor showed frequent PVCs along with bigeminy and trigeminy accounting for 6.4% of total beats.  Patient was bradycardic on metoprolol, and was referred to EP for further recommendations.  Saw EP in October 2019 cardiac CTA was recommended for possible initiation of flecainide.  [name] was also recommended to look for the presence of A-fib.  A loop recorder was placed on 07/08/2018.  ILR noted 4-hour episode of A-fib the patient was started on anticoagulation.  Cardiac CTA 08/2018 showed coronary calcium score of 542, 84th percentile for age and sex, diffuse CAD with moderate stenosis of the mid RCA, mid LAD, proximal D1, proximal left circumflex and mid OM1 artery, aortic aneurysm with maximum diameter 43 mm, mildly dilated pulmonary artery measuring 32 mm  suggestive of pulmonary hypertension.  It was sent for [facility] which showed no significant stenosis.  She was last seen in 2021 EP.  Patient was evaluated October 2022 for chest pain the setting of severe hypertension.  High-sensitivity troponin was 63 followed by 65.  Suspected demand ischemia.  Patient was started on amlodipine.  Patient presented to the ER 11/19/2022 for abdominal pain.  Reports severe epigastric pain rating into the chest.  No sweating or shortness of breath.  Blood pressure was 196/138, pulse 123 bpm, respiratory rate 24, afebrile, 95% O2.  Labs showed WBC 15.4 potassium 2.9, creatinine 1.63, BUN 24, glucose 188.  High-sensitivity troponin 61, 75, 102. Lipase 42.  BNP 212. EKG shows sinus tach with a heart rate of 115 bpm.  Chest x-ray nonacute.  CT angio chest/abdomen/pelvis negative for aneurysm or dissection, no acute inflammatory process.  Right upper quadrant ultrasound negative for acute cholecystitis.  Patient was given potassium, pain meds and started on nitroglycerin infusion and admitted for further workup.  Past Medical History:  Diagnosis Date   (HFpEF) heart failure with preserved ejection fraction ([place])    a. 12/2017 Echo: EF 60-65%, no rwma, Gr1DD, mild MR, mildly dil LA, nl RV fxn.   Acute respiratory failure with hypoxia ([place]) 07/13/2022   Allergy    Seasonal   Chest pain    a. 12/2017 Lexiscan MV: EF 37% (nl by echo). Small mild, fixed region of apical thinning, likely attenuation artifact. No ischemia. Low risk.   CKD (chronic kidney disease)  stage 3, GFR 30-59 ml/min (HCC) 03/30/2018   Diabetic nephropathy associated with type 2 diabetes mellitus ([place]) 03/25/2018   DKA (diabetic ketoacidoses) 12/01/2017   Heart murmur    a. 12/2017 Echo: Mild MR.   History of Bell's palsy 03/30/2018   Hypertension    Morbid obesity ([place])    Neuropathy 01/31/2014   NSTEMI (non-ST elevated myocardial infarction) ([place]) 11/19/2022   Palpitations    PVC's (premature ventricular  contractions)    a. 05/2018 Holter: RSR, avg rate of 60. Freq PVC's (6.4% burden). Some ventricular bigeminy/trigeminy.   Sepsis ([place]) 07/13/2022   Type 2 diabetes mellitus, uncontrolled, with neuropathy     Past Surgical History:  Procedure Laterality Date   colonoscvopy  2015   FOOT SURGERY  around [place] N/A 07/08/2018   Procedure: LOOP RECORDER INSERTION;  Surgeon: Deboraha Sprang, MD;  Location: [facility];  Service: Cardiovascular;  Laterality: N/A;     Home Medications:  Prior to Admission medications   Medication Sig Start Date End Date Taking? Authorizing Provider  acetaminophen (TYLENOL) 325 MG tablet Take 2 tablets (650 mg total) by mouth every 6 (six) hours as needed for mild pain, fever or headache. 07/17/22  Yes Nicole Kindred A, DO  allopurinol (ZYLOPRIM) 300 MG tablet Take 0.5 tablets (150 mg total) by mouth daily. 03/17/19  Yes Tukov-Yual, Magdalene S, NP  ascorbic acid (VITAMIN C) 500 MG tablet Take 2,000 mg by mouth daily.   Yes [provider]  atorvastatin (LIPITOR) 40 MG tablet Take 40 mg by mouth every evening.   Yes [provider]  b complex vitamins capsule Take 1 capsule by mouth daily.   Yes [provider]  busPIRone (BUSPAR) 15 MG tablet Take 15 mg by mouth 2 (two) times daily.   Yes [provider]  cloNIDine (CATAPRES) 0.3 MG tablet Take 1 tablet (0.3 mg total) by mouth 2 (two) times daily. 02/03/19  Yes Tukov-Yual, Magdalene S, NP  DULoxetine (CYMBALTA) 20 MG capsule Take 40 mg by mouth daily.   Yes [provider]  fluticasone (FLONASE) 50 MCG/ACT nasal spray Place 2 sprays into both nostrils daily. 07/18/22  Yes Nicole Kindred A, DO  gabapentin (NEURONTIN) 300 MG capsule Take 1 capsule (300 mg total) by mouth 3 (three) times daily. Home med. Patient taking differently: Take 600 mg by mouth 2 (two) times daily. 06/16/21  Yes Enzo Bi, MD  hydrALAZINE (APRESOLINE) 50 MG tablet Take  50 mg by mouth 2 (two) times daily.   Yes [provider]  loratadine (CLARITIN) 10 MG tablet Take 1 tablet (10 mg total) by mouth daily. 07/18/22  Yes Nicole Kindred A, DO  losartan (COZAAR) 100 MG tablet Take 1 tablet (100 mg total) by mouth daily. 07/17/22 07/17/23 Yes Nicole Kindred A, DO  metoprolol tartrate (LOPRESSOR) 25 MG tablet TAKE 1 TABLET BY MOUTH TWICE A DAY 06/08/20  Yes Deboraha Sprang, MD  amLODipine (NORVASC) 10 MG tablet Take 1 tablet (10 mg total) by mouth daily. Patient not taking: Reported on 11/19/2022 07/18/22   Nicole Kindred A, DO  chlorpheniramine-HYDROcodone (TUSSIONEX) 10-8 MG/5ML Take 5 mLs by mouth every 12 (twelve) hours as needed for cough. Patient not taking: Reported on 11/19/2022 07/17/22   Nicole Kindred A, DO  cholecalciferol (VITAMIN D3) 25 MCG (1000 UNIT) tablet Take 1,000 Units by mouth daily. Patient not taking: Reported on 11/19/2022    [provider]  glucose blood test strip 1  each by Other route 3 (three) times daily. Use as instructed 02/03/19   Tukov-Yual, Arlyss Gandy, NP  spironolactone (ALDACTONE) 25 MG tablet Take 25 mg by mouth daily. Patient not taking: Reported on 11/19/2022 09/30/22 09/30/23  [provider]  traMADol (ULTRAM) 50 MG tablet Take 50 mg by mouth every 6 (six) hours as needed for moderate pain. Patient not taking: Reported on 11/19/2022 09/30/22   [provider]    Inpatient Medications: Scheduled Meds:  allopurinol  150 mg Oral Daily   ascorbic acid  2,000 mg Oral Daily   [START ON 11/20/2022] aspirin EC  81 mg Oral Daily   atorvastatin  40 mg Oral QPM   [START ON 11/20/2022] B-complex with vitamin C  1 tablet Oral Daily   busPIRone  15 mg Oral BID   cloNIDine  0.3 mg Oral BID   DULoxetine  40 mg Oral Daily   gabapentin  600 mg Oral BID   heparin  5,000 Units Subcutaneous Q8H   hydrALAZINE  50 mg Oral TID   losartan  100 mg Oral Daily   metoprolol tartrate  25 mg Oral BID   Continuous Infusions:   sodium chloride 75 mL/hr at 11/19/22 0907   nitroGLYCERIN 20 mcg/min (11/19/22 0900)   PRN Meds: acetaminophen, albuterol, diphenhydrAMINE, morphine injection, traMADol  Allergies:    Allergies  Allergen Reactions   Lisinopril Cough and Rash   Metformin Nausea Only    Social History:   Social History   Socioeconomic History   Marital status: Married    Spouse name: Not on file   Number of children: 3   Years of education: Not on file   Highest education level: Not on file  Occupational History   Occupation: retired    Comment: Delivery driver-produce  Tobacco Use   Smoking status: Never   Smokeless tobacco: Never  Vaping Use   Vaping Use: Never used  Substance and Sexual Activity   Alcohol use: Not Currently   Drug use: Yes    Types: Marijuana    Comment: occasionally   Sexual activity: Not on file  Other Topics Concern   Not on file  Social History Narrative   Pt said that everything was going well.    Said the main stress for him is paying for medical care, but things are going well here.   No alcohol use noted.  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   Social Determinants of Health   Financial Resource Strain: Medium Risk (03/25/2018)   Overall Financial Resource Strain (CARDIA)    Difficulty of Paying Living Expenses: Somewhat hard  Food Insecurity: No Food Insecurity (03/25/2018)   Hunger Vital Sign    Worried About Running Out of Food in the Last Year: Never true    Ran Out of Food in the Last Year: Never true  Transportation Needs: No Transportation Needs (03/25/2018)   PRAPARE - Hydrologist (Medical): No    Lack of Transportation (Non-Medical): No  Physical Activity: Insufficiently Active (03/25/2018)   Exercise Vital Sign    Days of Exercise per Week: 5 days    Minutes of Exercise per Session: 20 min  Stress: Stress Concern Present (03/25/2018)   [place]    Feeling of Stress : To some extent  Social Connections: Moderately Integrated (03/25/2018)   Social Connection and Isolation Panel [NHANES]    Frequency of Communication with Friends  and Family: Three times a week    Frequency of Social Gatherings with Friends and Family: Twice a week    Attends Religious Services: More than 4 times per year    Active Member of [employer] or Organizations: No    Attends Archivist Meetings: Never    Marital Status: Married  Human resources officer Violence: Not At Risk (03/25/2018)   Humiliation, Afraid, Rape, and Kick questionnaire    Fear of Current or Ex-Partner: No    Emotionally Abused: No    Physically Abused: No    Sexually Abused: No    Family History:    Family History  Problem Relation Age of Onset   Cancer Mother        Skin   CAD Father    Arthritis Father      ROS:  Please see the history of present illness.   All other ROS reviewed and negative.     Physical Exam/Data:   Vitals:   11/19/22 0945 11/19/22 0959 11/19/22 1000 11/19/22 1015  BP: (!) 167/108  (!) 173/114 (!) 170/117  Pulse: 95 (!) 106 (!) 107 93  Resp: (!) 22  (!) 30 13  Temp:   98.4 F (36.9 C)   TempSrc:   Oral   SpO2: 93%  94% 94%  Weight:      Height:       No intake or output data in the 24 hours ending 11/19/22 1041    11/19/2022    2:20 AM 07/13/2022    5:13 PM 06/15/2021    5:56 PM  Last 3 Weights  Weight (lbs) 275 lb 279 lb 12.2 oz 240 lb 4.8 oz  Weight (kg) 124.739 kg 126.9 kg 109 kg     Body mass index is 36.28 kg/m.  General:  Well nourished, well developed, in no acute distress*** HEENT: normal Neck: no JVD Vascular: No carotid bruits; Distal pulses 2+ bilaterally Cardiac:  normal S1, S2; RRR; no murmur *** Lungs:  clear to auscultation bilaterally, no wheezing, rhonchi or rales  Abd: soft, nontender, no hepatomegaly  Ext: no edema Musculoskeletal:  No deformities, BUE and BLE strength normal and equal Skin: warm  and dry  Neuro:  CNs 2-12 intact, no focal abnormalities noted Psych:  Normal affect   EKG:  The EKG was personally reviewed and demonstrates:  *** Telemetry:  Telemetry was personally reviewed and demonstrates:  ***  Relevant CV Studies:  Echo 06/2021 1. Left ventricular ejection fraction, by estimation, is 60 to 65%. The  left ventricle has normal function. The left ventricle has no regional  wall motion abnormalities. Left ventricular diastolic parameters were  normal.   2. Right ventricular systolic function is normal. The right ventricular  size is normal.   3. Left atrial size was mildly dilated.   4. The mitral valve is normal in structure. Mild mitral valve  regurgitation.   5. The aortic valve is normal in structure. Aortic valve regurgitation is  not visualized.   Echo 08/2019  1. Left ventricular ejection fraction, by visual estimation, is 55 to  60%. The left ventricle has normal function. There is mildly increased  left ventricular hypertrophy.   2. Left ventricular diastolic parameters are consistent with Grade I  diastolic dysfunction (impaired relaxation).   3. The left ventricle has no regional wall motion abnormalities.   4. Global right ventricle has normal systolic function.The right  ventricular size is normal. Right vetricular wall thickness was not  assessed.  5. Left atrial size was mild-moderately dilated.   6. Right atrial size was moderately dilated.   7. The mitral valve is normal in structure. No evidence of mitral valve  regurgitation.   8. The tricuspid valve is grossly normal.   9. The aortic valve is tricuspid. Aortic valve regurgitation is not  visualized.  10. Pulmonic regurgitation not assessed.  11. The pulmonic valve was not well visualized. Pulmonic valve  regurgitation not assessed.  12. The inferior vena cava is normal in size with greater than 50%  respiratory variability, suggesting right atrial pressure of 3 mmHg.    Cardiac  CTA 08/2028 1. Left Main:  No significant stenosis.   2. LAD: No significant stenosis. 3. LCX: No significant stenosis. 4. RCA: No significant stenosis.   IMPRESSION: 1.  CT FFR analysis didn't show any significant stenosis.  Laboratory Data:  High Sensitivity Troponin:   Recent Labs  Lab 11/19/22 0253 11/19/22 0402 11/19/22 0616 11/19/22 0845  TROPONINIHS 61* 75* 102* 102*     Chemistry Recent Labs  Lab 11/19/22 0402 11/19/22 0410  NA  --  140  K  --  2.9*  CL  --  103  CO2  --  24  GLUCOSE  --  188*  BUN  --  24*  CREATININE  --  1.63*  CALCIUM  --  9.5  MG 1.8  --   GFRNONAA  --  46*  ANIONGAP  --  13    Recent Labs  Lab 11/19/22 0410  PROT 7.9  ALBUMIN 4.2  AST 31  ALT 25  ALKPHOS 95  BILITOT 1.4*   Lipids No results for input(s): "CHOL", "TRIG", "HDL", "LABVLDL", "LDLCALC", "CHOLHDL" in the last 168 hours.  Hematology Recent Labs  Lab 11/19/22 0253  WBC 15.4*  15.4*  RBC 5.46  5.43  HGB 16.8  16.9  HCT 50.0  49.8  MCV 91.6  91.7  MCH 30.8  31.1  MCHC 33.6  33.9  RDW 15.6*  15.6*  PLT 396  377   Thyroid No results for input(s): "TSH", "FREET4" in the last 168 hours.  BNP Recent Labs  Lab 11/19/22 0253  BNP 212.6*    DDimer No results for input(s): "DDIMER" in the last 168 hours.   Radiology/Studies:  US ABDOMEN LIMITED RUQ (LIVER/GB)  Result Date: 11/19/2022 CLINICAL DATA:  67 year old male with history of upper abdominal pain. EXAM: ULTRASOUND ABDOMEN LIMITED RIGHT UPPER QUADRANT COMPARISON:  No priors. FINDINGS: Gallbladder: There is some amorphous echogenic but nonshadowing material lying dependently in the gallbladder, compatible with biliary sludge. Some well organized sludge balls are noted measuring up to 3 mm. Gallbladder is only moderately distended with normal wall thickness of 3 mm. No pericholecystic fluid. Per report from the sonographer, there was no sonographic Murphy's sign on examination. Common bile duct:  Diameter: 6.2 mm. Liver: No focal lesion identified. Within normal limits in parenchymal echogenicity. Portal vein is patent on color Doppler imaging with normal direction of blood flow towards the liver. Other: None. IMPRESSION: 1. Study is positive for biliary sludge in the gallbladder including small sludge balls, but no findings to suggest an acute cholecystitis at this time. Electronically Signed   By: Vinnie Langton M.D.   On: 11/19/2022 07:48   CT Angio Chest/Abd/Pel for Dissection W and/or Wo Contrast  Result Date: 11/19/2022 CLINICAL DATA:  67 year old male with severe chest and epigastric abdominal pain with vomiting. EXAM: CT ANGIOGRAPHY CHEST, ABDOMEN AND PELVIS TECHNIQUE: Non-contrast CT of  the chest was initially obtained. Multidetector CT imaging through the chest, abdomen and pelvis was performed using the standard protocol during bolus administration of intravenous contrast. Multiplanar reconstructed images and MIPs were obtained and reviewed to evaluate the vascular anatomy. RADIATION DOSE REDUCTION: This exam was performed according to the departmental dose-optimization program which includes automated exposure control, adjustment of the mA and/or kV according to patient size and/or use of iterative reconstruction technique. CONTRAST:  165m OMNIPAQUE IOHEXOL 350 MG/ML SOLN COMPARISON:  Portable chest 0230 hours today. CTA chest 07/13/2022. FINDINGS: CTA CHEST FINDINGS Cardiovascular: Calcified coronary artery plaque or stents (series 3, image 31). Only trace thoracic aortic calcified plaque. Cardiac size within normal limits. No pericardial effusion. Following contrast both the thoracic aorta and main pulmonary arteries are opacified. No evidence of central or lobar pulmonary embolus. No thoracic aortic aneurysm or dissection. Unremarkable proximal great vessels. Mediastinum/Nodes: Negative. No mediastinal mass or lymphadenopathy. Lungs/Pleura: Major airways are patent. Stable to improved  lung volumes compared to last year. Resolved multifocal right lung airspace disease since the prior CTA. Minor dependent atelectasis and/or scarring. No pleural effusion or acute lung finding. Musculoskeletal: Widespread thoracic interbody ankylosis from flowing endplate osteophytes. Stable mildly exaggerated thoracic kyphosis. No acute or suspicious osseous lesion identified. Chronic left AC joint degeneration. Review of the MIP images confirms the above findings. CTA ABDOMEN AND PELVIS FINDINGS VASCULAR Mildly tortuous abdominal aorta and iliac arteries with Aortoiliac calcified atherosclerosis. But negative for abdominal aortic aneurysm or dissection. Major aortic branches remain patent with no high-grade stenosis. Early contrast timing in the pelvis, grossly patent major iliac arteries. Review of the MIP images confirms the above findings. NON-VASCULAR Hepatobiliary: Negative liver and gallbladder. Pancreas: Negative. Spleen: Negative. Adrenals/Urinary Tract: Negative adrenal glands. Exophytic but simple fluid density left renal midpole cyst (no follow-up imaging recommended). Nonobstructed kidneys with symmetric renal enhancement. Smaller exophytic right renal cysts also appear simple (no follow-up imaging recommended). Decompressed ureters. Decompressed bladder. Mild pelvic phleboliths. Stomach/Bowel: Redundant large bowel but only mild retained stool. Descending and transverse diverticulosis is mild to moderate. No active inflammation. Normal retrocecal appendix on series 6, image 216. No dilated small bowel. Mostly decompressed stomach and duodenum. No free air or free fluid identified. Lymphatic: No lymphadenopathy. Reproductive: Negative. Other: No pelvis free fluid. Musculoskeletal: L4-L5 mild spondylolisthesis with lumbar disc and severe facet degeneration with evidence of gas containing synovial cysts larger on the right (series 6, image 220). Associated spinal stenosis, neural impingement at that  level. No acute osseous abnormality identified. Review of the MIP images confirms the above findings. IMPRESSION: 1. Negative for aortic aneurysm or dissection. Positive for coronary artery disease, Aortic Atherosclerosis (ICD10-I70.0). 2. No acute or inflammatory process identified in the chest, abdomen, or pelvis. Resolved right lung Pneumonia since last year. 3. 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 Electronically Signed   By: HGenevie AnnM.D.   On: 11/19/2022 05:49   DG Chest 1 View  Result Date: 11/19/2022 CLINICAL DATA:  Chest and abdominal pain EXAM: PORTABLE CHEST 1 VIEW COMPARISON:  07/13/2022 FINDINGS: Cardiac shadow is stable. Loop recorder is again noted. Aortic calcifications are seen. Lungs are clear. No bony abnormality is noted. IMPRESSION: No active disease. Electronically Signed   By: MInez CatalinaM.D.   On: 11/19/2022 02:36     Assessment and Plan:     For questions or updates, please contact CSeamanPlease consult [email or web address] for contact info under  Signed, Arliene Rosenow Ninfa Meeker, PA-C  11/19/2022 10:41 AM

## 2022-11-19 NOTE — H&P (Signed)
History and Physical    Steve Buck. [id number] DOB: Oct 22, 1955 DOA: 11/19/2022  Referring MD/NP/PA:   PCP: Kirk Ruths, MD   Patient coming from:  The patient is coming from home.    Chief Complaint: Epigastric abdominal pain  HPI: Steve Buck. is a 67 y.o. male with medical history significant of dCHF, HTN, HLD, DM, gout, depression, who presents with epigastric abdominal pain.  Pt states that he has abdominal pain for almost 2 days, which is mainly located in the epigastric area, aching, 6 out of 10 in severity, nonradiating.  Patient also reports 1 episode of right sided abdominal pain, which has resolved.  Patient has nausea and dry heaves, no diarrhea.  Denies fever or chills.  Patient has mild short breath, no cough.  Patient had mild chest pain which was located in the substernal area, pressure-like, nonradiating, currently chest pain is resolved.  No dark stool or rectal bleeding.   Pt was found to have elevated Bp 211/141, nitroglycerin drip is started in ED.  Data reviewed independently and ED Course: pt was found to have trop  61 --> 75 --> 102 --> 102 --> 107, WBC 15.4, lactic acid 2.1, 2.5, procalcitonin < 0.10, potassium 2.9, LFT normal, lipase 43, magnesium 1.8, stable renal function, BNP 212, temperature normal, heart rate 130, 119, RR 29, oxygen saturation 98% on room air.  Chest x-ray negative. CTA of chest/Abd/pelvis negative for dissection. US-RUQ showed sludge, but no signs of cholecystitis. Pt is admitted to SDU as inpt. Dr. Garen Lah of card is consulted. Dr. Christian Mate of surgery is consulted.   EKG: I have personally reviewed.  Sinus rhythm, QTc 514, LAD, poor R wave progression, T wave inversion in lead V2.  The repeated EKG showed sinus rhythm, QTc 446, LAD, poor R wave progression.  The third EKG showed QTc 502, PVC and PAC.  Review of Systems:   General: no fevers, chills, no body weight gain, has fatigue HEENT: no blurry vision,  hearing changes or sore throat Respiratory: has dyspnea, no coughing, wheezing CV: has chest pain, no palpitations GI: has nausea, dry heaves, abdominal pain, no diarrhea, constipation GU: no dysuria, burning on urination, increased urinary frequency, hematuria  Ext: has trace leg edema Neuro: no unilateral weakness, numbness, or tingling, no vision change or hearing loss Skin: no rash, no skin tear. MSK: No muscle spasm, no deformity, no limitation of range of movement in spin Heme: No easy bruising.  Travel history: No recent long distant travel.   Allergy:  Allergies  Allergen Reactions   Lisinopril Cough and Rash   Metformin Nausea Only    Past Medical History:  Diagnosis Date   (HFpEF) heart failure with preserved ejection fraction ([place])    a. 12/2017 Echo: EF 60-65%, no rwma, Gr1DD, mild MR, mildly dil LA, nl RV fxn.   Acute respiratory failure with hypoxia ([place]) 07/13/2022   Allergy    Seasonal   Chest pain    a. 12/2017 Lexiscan MV: EF 37% (nl by echo). Small mild, fixed region of apical thinning, likely attenuation artifact. No ischemia. Low risk.   CKD (chronic kidney disease) stage 3, GFR 30-59 ml/min (HCC) 03/30/2018   Diabetic nephropathy associated with type 2 diabetes mellitus ([place]) 03/25/2018   DKA (diabetic ketoacidoses) 12/01/2017   Heart murmur    a. 12/2017 Echo: Mild MR.   History of Bell's palsy 03/30/2018   Hypertension    Morbid obesity ([place])    Neuropathy 01/31/2014  NSTEMI (non-ST elevated myocardial infarction) ([place]) 11/19/2022   Palpitations    PVC's (premature ventricular contractions)    a. 05/2018 Holter: RSR, avg rate of 60. Freq PVC's (6.4% burden). Some ventricular bigeminy/trigeminy.   Sepsis ([place]) 07/13/2022   Type 2 diabetes mellitus, uncontrolled, with neuropathy     Past Surgical History:  Procedure Laterality Date   colonoscvopy  2015   FOOT SURGERY  around [place] N/A 07/08/2018   Procedure: LOOP RECORDER  INSERTION;  Surgeon: Deboraha Sprang, MD;  Location: [facility];  Service: Cardiovascular;  Laterality: N/A;    Social History:  reports that he has never smoked. He has never used smokeless tobacco. He reports that he does not currently use alcohol. He reports current drug use. Drug: Marijuana.  Family History:  Family History  Problem Relation Age of Onset   Cancer Mother        Skin   CAD Father    Arthritis Father      Prior to Admission medications   Medication Sig Start Date End Date Taking? Authorizing Provider  acetaminophen (TYLENOL) 325 MG tablet Take 2 tablets (650 mg total) by mouth every 6 (six) hours as needed for mild pain, fever or headache. 07/17/22   Ezekiel Slocumb, DO  allopurinol (ZYLOPRIM) 300 MG tablet Take 0.5 tablets (150 mg total) by mouth daily. 03/17/19   Tukov-Yual, Arlyss Gandy, NP  amLODipine (NORVASC) 10 MG tablet Take 1 tablet (10 mg total) by mouth daily. 07/18/22   Ezekiel Slocumb, DO  ascorbic acid (VITAMIN C) 500 MG tablet Take 2,000 mg by mouth daily.    [provider]  atorvastatin (LIPITOR) 40 MG tablet Take 40 mg by mouth every evening.    [provider]  b complex vitamins capsule Take 1 capsule by mouth daily.    [provider]  busPIRone (BUSPAR) 15 MG tablet Take 15 mg by mouth 2 (two) times daily.    [provider]  chlorpheniramine-HYDROcodone (TUSSIONEX) 10-8 MG/5ML Take 5 mLs by mouth every 12 (twelve) hours as needed for cough. 07/17/22   Ezekiel Slocumb, DO  cholecalciferol (VITAMIN D3) 25 MCG (1000 UNIT) tablet Take 1,000 Units by mouth daily.    [provider]  cloNIDine (CATAPRES) 0.3 MG tablet Take 1 tablet (0.3 mg total) by mouth 2 (two) times daily. 02/03/19   Tukov-Yual, Arlyss Gandy, NP  DULoxetine (CYMBALTA) 20 MG capsule Take 40 mg by mouth daily.    [provider]  fluticasone (FLONASE) 50 MCG/ACT nasal spray Place 2 sprays into both nostrils daily. 07/18/22    Nicole Kindred A, DO  gabapentin (NEURONTIN) 300 MG capsule Take 1 capsule (300 mg total) by mouth 3 (three) times daily. Home med. Patient taking differently: Take 600 mg by mouth 2 (two) times daily. 06/16/21   Enzo Bi, MD  glucose blood test strip 1 each by Other route 3 (three) times daily. Use as instructed 02/03/19   Tukov-Yual, Arlyss Gandy, NP  hydrALAZINE (APRESOLINE) 50 MG tablet Take 50 mg by mouth 2 (two) times daily.    [provider]  loratadine (CLARITIN) 10 MG tablet Take 1 tablet (10 mg total) by mouth daily. 07/18/22   Ezekiel Slocumb, DO  losartan (COZAAR) 100 MG tablet Take 1 tablet (100 mg total) by mouth daily. 07/17/22 07/17/23  Nicole Kindred A, DO  metoprolol tartrate (LOPRESSOR) 25 MG tablet TAKE 1 TABLET BY MOUTH TWICE A DAY 06/08/20  Deboraha Sprang, MD    Physical Exam: Vitals:   11/19/22 1430 11/19/22 1438 11/19/22 1730 11/19/22 1758  BP: 125/74   131/84  Pulse: 61  87 80  Resp: '15  17 15  '$ Temp:  98.5 F (36.9 C)    TempSrc:  Oral    SpO2: 97%  98% 94%  Weight:      Height:       General: Not in acute distress HEENT:       Eyes: PERRL, EOMI, no scleral icterus.       ENT: No discharge from the ears and nose, no pharynx injection, no tonsillar enlargement.        Neck: No JVD, no bruit, no mass felt. Heme: No neck lymph node enlargement. Cardiac: S1/S2, RRR, No gallops or rubs. Respiratory: No rales, wheezing, rhonchi or rubs. GI: Soft, nondistended, has mild tenderness in epigastric area, no rebound pain, no organomegaly, BS present. GU: No hematuria Ext: No pitting leg edema bilaterally. 1+DP/PT pulse bilaterally. Musculoskeletal: No joint deformities, No joint redness or warmth, no limitation of ROM in spin. Skin: No rashes.  Neuro: Alert, oriented X3, cranial nerves II-XII grossly intact, moves all extremities normally.   Labs on Admission: I have personally reviewed following labs and imaging studies  CBC: Recent Labs  Lab  11/19/22 0253  WBC 15.4*  15.4*  NEUTROABS 9.2*  HGB 16.8  16.9  HCT 50.0  49.8  MCV 91.6  91.7  PLT 396  [id number]   Basic Metabolic Panel: Recent Labs  Lab 11/19/22 0402 11/19/22 0410 11/19/22 0845  NA  --  140  --   K  --  2.9*  --   CL  --  103  --   CO2  --  24  --   GLUCOSE  --  188*  --   BUN  --  24*  --   CREATININE  --  1.63*  --   CALCIUM  --  9.5  --   MG 1.8  --   --   PHOS  --   --  2.9   GFR: Estimated Creatinine Clearance: 60 mL/min (A) (by C-G formula based on SCr of 1.63 mg/dL (H)). Liver Function Tests: Recent Labs  Lab 11/19/22 0410  AST 31  ALT 25  ALKPHOS 95  BILITOT 1.4*  PROT 7.9  ALBUMIN 4.2   Recent Labs  Lab 11/19/22 0410  LIPASE 42   No results for input(s): "AMMONIA" in the last 168 hours. Coagulation Profile: Recent Labs  Lab 11/19/22 1005  INR 1.1   Cardiac Enzymes: No results for input(s): "CKTOTAL", "CKMB", "CKMBINDEX", "TROPONINI" in the last 168 hours. BNP (last 3 results) No results for input(s): "PROBNP" in the last 8760 hours. HbA1C: No results for input(s): "HGBA1C" in the last 72 hours. CBG: No results for input(s): "GLUCAP" in the last 168 hours. Lipid Profile: No results for input(s): "CHOL", "HDL", "LDLCALC", "TRIG", "CHOLHDL", "LDLDIRECT" in the last 72 hours. Thyroid Function Tests: No results for input(s): "TSH", "T4TOTAL", "FREET4", "T3FREE", "THYROIDAB" in the last 72 hours. Anemia Panel: No results for input(s): "VITAMINB12", "FOLATE", "FERRITIN", "TIBC", "IRON", "RETICCTPCT" in the last 72 hours. Urine analysis:    Component Value Date/Time   COLORURINE YELLOW (A) 11/19/2022 0534   APPEARANCEUR HAZY (A) 11/19/2022 0534   APPEARANCEUR Clear 03/30/2018 1856   LABSPEC 1.028 11/19/2022 0534   PHURINE 5.0 11/19/2022 [place] 11/19/2022 0534   HGBUR NEGATIVE 11/19/2022 0534  BILIRUBINUR NEGATIVE 11/19/2022 0534   BILIRUBINUR Negative 03/30/2018 1856   KETONESUR NEGATIVE 11/19/2022  0534   PROTEINUR >=300 (A) 11/19/2022 0534   NITRITE NEGATIVE 11/19/2022 0534   LEUKOCYTESUR NEGATIVE 11/19/2022 0534   Sepsis Labs: '@LABRCNTIP'$ (procalcitonin:4,lacticidven:4) )No results found for this or any previous visit (from the past 240 hour(s)).   Radiological Exams on Admission: ECHOCARDIOGRAM COMPLETE  Result Date: 11/19/2022    ECHOCARDIOGRAM REPORT   Patient Name:   Steve Buck. Date of Exam: 11/19/2022 Medical Rec #:  [id number]             Height:       73.0 in Accession #:    [id number]            Weight:       275.0 lb Date of Birth:  [birth year]             BSA:          2.463 m Patient Age:    70 years              BP:           123/71 mmHg Patient Gender: M                     HR:           57 bpm. Exam Location:  [facility] Procedure: 2D Echo, Cardiac Doppler and Color Doppler Indications:     Chest pain R07.9  History:         Patient has prior history of Echocardiogram examinations, most                  recent 06/16/2021. Signs/Symptoms:Murmur; Risk                  Factors:Hypertension and Diabetes. NSTEMI.  Sonographer:     Sherrie Sport Referring Phys:  [id number] Kate Sable Diagnosing Phys: Kate Sable MD IMPRESSIONS  1. Left ventricular ejection fraction, by estimation, is 55 to 60%. The left ventricle has normal function. The left ventricle has no regional wall motion abnormalities. There is mild left ventricular hypertrophy. Left ventricular diastolic parameters are consistent with Grade I diastolic dysfunction (impaired relaxation).  2. Right ventricular systolic function is normal. The right ventricular size is normal.  3. Left atrial size was mildly dilated.  4. The mitral valve is normal in structure. No evidence of mitral valve regurgitation.  5. The aortic valve was not well visualized. Aortic valve regurgitation is not visualized. Aortic valve sclerosis/calcification is present, without any evidence of aortic stenosis. FINDINGS  Left Ventricle: Left ventricular  ejection fraction, by estimation, is 55 to 60%. The left ventricle has normal function. The left ventricle has no regional wall motion abnormalities. The left ventricular internal cavity size was normal in size. There is  mild left ventricular hypertrophy. Left ventricular diastolic parameters are consistent with Grade I diastolic dysfunction (impaired relaxation). Right Ventricle: The right ventricular size is normal. No increase in right ventricular wall thickness. Right ventricular systolic function is normal. Left Atrium: Left atrial size was mildly dilated. Right Atrium: Right atrial size was normal in size. Pericardium: There is no evidence of pericardial effusion. Mitral Valve: The mitral valve is normal in structure. No evidence of mitral valve regurgitation. Tricuspid Valve: The tricuspid valve is not well visualized. Tricuspid valve regurgitation is not demonstrated. Aortic Valve: The aortic valve was not well visualized. Aortic valve regurgitation is not visualized. Aortic valve sclerosis/calcification is  present, without any evidence of aortic stenosis. Aortic valve mean gradient measures 3.5 mmHg. Aortic valve peak gradient measures 5.2 mmHg. Aortic valve area, by VTI measures 4.26 cm. Pulmonic Valve: The pulmonic valve was not well visualized. Pulmonic valve regurgitation is not visualized. Aorta: The aortic root is normal in size and structure. Venous: The inferior vena cava was not well visualized. IAS/Shunts: No atrial level shunt detected by color flow Doppler.  LEFT VENTRICLE PLAX 2D LVOT diam:     2.30 cm   Diastology LV SV:         88        LV e' medial:    4.24 cm/s LV SV Index:   36        LV E/e' medial:  13.7 LVOT Area:     4.15 cm  LV e' lateral:   4.90 cm/s                          LV E/e' lateral: 11.9  RIGHT VENTRICLE RV Basal diam:  2.90 cm RV Mid diam:    2.90 cm RV S prime:     11.90 cm/s TAPSE (M-mode): 2.4 cm LEFT ATRIUM             Index        RIGHT ATRIUM           Index LA  Vol (A2C):   61.5 ml 24.96 ml/m  RA Area:     10.70 cm LA Vol (A4C):   93.6 ml 38.00 ml/m  RA Volume:   14.10 ml  5.72 ml/m LA Biplane Vol: 79.3 ml 32.19 ml/m  AORTIC VALVE AV Area (Vmax):    3.99 cm AV Area (Vmean):   3.77 cm AV Area (VTI):     4.26 cm AV Vmax:           114.50 cm/s AV Vmean:          82.000 cm/s AV VTI:            0.207 m AV Peak Grad:      5.2 mmHg AV Mean Grad:      3.5 mmHg LVOT Vmax:         110.00 cm/s LVOT Vmean:        74.400 cm/s LVOT VTI:          0.212 m LVOT/AV VTI ratio: 1.02 MITRAL VALVE               TRICUSPID VALVE MV Area (PHT): 2.95 cm    TR Peak grad:   18.1 mmHg MV Decel Time: 257 msec    TR Vmax:        213.00 cm/s MV E velocity: 58.20 cm/s MV A velocity: 88.00 cm/s  SHUNTS MV E/A ratio:  0.66        Systemic VTI:  0.21 m                            Systemic Diam: 2.30 cm Kate Sable MD Electronically signed by Kate Sable MD Signature Date/Time: 11/19/2022/4:23:51 PM    Final    US ABDOMEN LIMITED RUQ (LIVER/GB)  Result Date: 11/19/2022 CLINICAL DATA:  68 year old male with history of upper abdominal pain. EXAM: ULTRASOUND ABDOMEN LIMITED RIGHT UPPER QUADRANT COMPARISON:  No priors. FINDINGS: Gallbladder: There is some amorphous echogenic but nonshadowing material lying dependently in the gallbladder, compatible with biliary sludge. Some  well organized sludge balls are noted measuring up to 3 mm. Gallbladder is only moderately distended with normal wall thickness of 3 mm. No pericholecystic fluid. Per report from the sonographer, there was no sonographic Murphy's sign on examination. Common bile duct: Diameter: 6.2 mm. Liver: No focal lesion identified. Within normal limits in parenchymal echogenicity. Portal vein is patent on color Doppler imaging with normal direction of blood flow towards the liver. Other: None. IMPRESSION: 1. Study is positive for biliary sludge in the gallbladder including small sludge balls, but no findings to suggest an acute  cholecystitis at this time. Electronically Signed   By: Vinnie Langton M.D.   On: 11/19/2022 07:48   CT Angio Chest/Abd/Pel for Dissection W and/or Wo Contrast  Result Date: 11/19/2022 CLINICAL DATA:  67 year old male with severe chest and epigastric abdominal pain with vomiting. EXAM: CT ANGIOGRAPHY CHEST, ABDOMEN AND PELVIS TECHNIQUE: Non-contrast CT of the chest was initially obtained. Multidetector CT imaging through the chest, abdomen and pelvis was performed using the standard protocol during bolus administration of intravenous contrast. Multiplanar reconstructed images and MIPs were obtained and reviewed to evaluate the vascular anatomy. RADIATION DOSE REDUCTION: This exam was performed according to the departmental dose-optimization program which includes automated exposure control, adjustment of the mA and/or kV according to patient size and/or use of iterative reconstruction technique. CONTRAST:  133m OMNIPAQUE IOHEXOL 350 MG/ML SOLN COMPARISON:  Portable chest 0230 hours today. CTA chest 07/13/2022. FINDINGS: CTA CHEST FINDINGS Cardiovascular: Calcified coronary artery plaque or stents (series 3, image 31). Only trace thoracic aortic calcified plaque. Cardiac size within normal limits. No pericardial effusion. Following contrast both the thoracic aorta and main pulmonary arteries are opacified. No evidence of central or lobar pulmonary embolus. No thoracic aortic aneurysm or dissection. Unremarkable proximal great vessels. Mediastinum/Nodes: Negative. No mediastinal mass or lymphadenopathy. Lungs/Pleura: Major airways are patent. Stable to improved lung volumes compared to last year. Resolved multifocal right lung airspace disease since the prior CTA. Minor dependent atelectasis and/or scarring. No pleural effusion or acute lung finding. Musculoskeletal: Widespread thoracic interbody ankylosis from flowing endplate osteophytes. Stable mildly exaggerated thoracic kyphosis. No acute or suspicious  osseous lesion identified. Chronic left AC joint degeneration. Review of the MIP images confirms the above findings. CTA ABDOMEN AND PELVIS FINDINGS VASCULAR Mildly tortuous abdominal aorta and iliac arteries with Aortoiliac calcified atherosclerosis. But negative for abdominal aortic aneurysm or dissection. Major aortic branches remain patent with no high-grade stenosis. Early contrast timing in the pelvis, grossly patent major iliac arteries. Review of the MIP images confirms the above findings. NON-VASCULAR Hepatobiliary: Negative liver and gallbladder. Pancreas: Negative. Spleen: Negative. Adrenals/Urinary Tract: Negative adrenal glands. Exophytic but simple fluid density left renal midpole cyst (no follow-up imaging recommended). Nonobstructed kidneys with symmetric renal enhancement. Smaller exophytic right renal cysts also appear simple (no follow-up imaging recommended). Decompressed ureters. Decompressed bladder. Mild pelvic phleboliths. Stomach/Bowel: Redundant large bowel but only mild retained stool. Descending and transverse diverticulosis is mild to moderate. No active inflammation. Normal retrocecal appendix on series 6, image 216. No dilated small bowel. Mostly decompressed stomach and duodenum. No free air or free fluid identified. Lymphatic: No lymphadenopathy. Reproductive: Negative. Other: No pelvis free fluid. Musculoskeletal: L4-L5 mild spondylolisthesis with lumbar disc and severe facet degeneration with evidence of gas containing synovial cysts larger on the right (series 6, image 220). Associated spinal stenosis, neural impingement at that level. No acute osseous abnormality identified. Review of the MIP images confirms the above findings. IMPRESSION: 1. Negative for aortic  aneurysm or dissection. Positive for coronary artery disease, Aortic Atherosclerosis (ICD10-I70.0). 2. No acute or inflammatory process identified in the chest, abdomen, or pelvis. Resolved right lung Pneumonia since last  year. 3. 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 Electronically Signed   By: Genevie Ann M.D.   On: 11/19/2022 05:49   DG Chest 1 View  Result Date: 11/19/2022 CLINICAL DATA:  Chest and abdominal pain EXAM: PORTABLE CHEST 1 VIEW COMPARISON:  07/13/2022 FINDINGS: Cardiac shadow is stable. Loop recorder is again noted. Aortic calcifications are seen. Lungs are clear. No bony abnormality is noted. IMPRESSION: No active disease. Electronically Signed   By: Inez Catalina M.D.   On: 11/19/2022 02:36      Assessment/Plan Principal Problem:   Hypertensive emergency Active Problems:   NSTEMI (non-ST elevated myocardial infarction) ([place])   Abdominal pain   Gallbladder sludge   Type II diabetes mellitus with renal manifestations (HCC)   Chronic kidney disease, stage 3a (HCC)   Gout   Elevated lactic acid level   Obesity (BMI 30-39.9)   Coronary artery disease involving native coronary artery of native heart   Assessment and Plan:   Hypertensive emergency:  Bp is 211/140 initially   -admit to SDU as inpt -continue nitroglycerin drip -Continue home clonidine, oral hydralazine, Cozaar, metoprolol -Consulted with Dr.Agbor-Etang of card due to non-STEMI  NSTEMI (non-ST elevated myocardial infarction) [facility]): Troponin level 61, 75, 102, 102, 107, -ASA -lipitor -trend trop -check A1c and FLP, UDS  Abdominal pain and gallbladder sludge: Etiology for his epigastric abdominal pain is not clear.  Ultrasound showed gallbladder sludge.  Patient has WBC 15.4 and elevated lactic acid.  Consulted Dr. Milas Gain of surgery. -Started Zosyn empirically -Blood culture -Follow-up surgeon's recommendation -IV fluid: 1.5 L normal saline, then 75 cc/h  Diet controlled type II diabetes mellitus with renal manifestations ([place]): Stable recent A1c 6.3.  Well-controlled.  Patient is not taking medications currently.  Blood sugar 188  but BMI of -SSI  Chronic kidney disease, stage 3a ([place]): Stable.  Baseline creatinine 1.0 on 07/17/2022.  His creatinine is 1.63, BUN 24, GFR 46 -Monitor renal treatment BMP  Gout -Allopurinol  Elevated lactic acid level: Lactic acid 2.1 --> 2.5.  Patient has leukocytosis, but no fever.  Does not seem to have sepsis clinically.  Elevated lactic acid may be due to dehydration -IV fluid as above -Trend lactic acid level  Obesity (BMI 30-39.9): Body weight 124.7 kg, BMI 36.29 -Encourage losing weight -Healthy diet and exercise    DVT ppx: SQ Heparin    Code Status: Full code  Family Communication: I offered to call his family, but patient states that I do not need to call his family since his son was here with him and his son knows what is going on for him.  Disposition Plan:  Anticipate discharge back to previous environment  Consults called:  Dr. Garen Lah of card is consulted. Dr. Christian Mate of surgery is consulted.  Admission status and Level of care: Stepdown:   as inpt       Dispo: The patient is from: Home              Anticipated d/c is to: Home              Anticipated d/c date is: 2 days              Patient currently is not medically stable to d/c.  Severity of Illness:  The appropriate patient status for this patient is INPATIENT. Inpatient status is judged to be reasonable and necessary in order to provide the required intensity of service to ensure the patient's safety. The patient's presenting symptoms, physical exam findings, and initial radiographic and laboratory data in the context of their chronic comorbidities is felt to place them at high risk for further clinical deterioration. Furthermore, it is not anticipated that the patient will be medically stable for discharge from the hospital within 2 midnights of admission.   * I certify that at the point of admission it is my clinical judgment that the patient will require inpatient hospital care spanning  beyond 2 midnights from the point of admission due to high intensity of service, high risk for further deterioration and high frequency of surveillance required.*       Date of Service 11/19/2022    Ivor Costa [facility]   If 7PM-7AM, please contact night-coverage [email or web address] 11/19/2022, 6:09 PM

## 2022-11-19 NOTE — ED Provider Triage Note (Signed)
Emergency Medicine Provider Triage Evaluation Note  Steve Buck. , a 67 y.o. male  was evaluated in triage.  Pt complains of a history of epigastric abdominal pain, nausea and vomiting.  Review of Systems  Positive: Abdominal pain, dry heaves, nausea/vomiting Negative: Chest pain, shortness of breath  Physical Exam  BP (!) 196/138 (BP Location: Right Arm)   Pulse (!) 123   Temp 98 F (36.7 C)   Resp 19   Ht '6\' 1"'$  (1.854 m)   Wt 124.7 kg   SpO2 95%   BMI 36.28 kg/m  Gen:   Awake, moderate distress   Resp:  Increased effort  MSK:   Moves extremities without difficulty  Other:  Moderately tender to palpation epigastrium.  Diaphoretic.  Medical Decision Making  Medically screening exam initiated at 3:27 AM.  Appropriate orders placed.  Steve Buck. was informed that the remainder of the evaluation will be completed by another provider, this initial triage assessment does not replace that evaluation, and the importance of remaining in the ED until their evaluation is complete.  67 year old male presenting with epigastric abdominal pain, nausea and vomiting.  Will obtain cardiac panel including LFTs/lipase, CT abdomen/pelvis.  Initiate IV fluid resuscitation, low-dose IV Dilaudid for pain paired with IV Zofran for nausea while patient awaits assigned provider.   Paulette Blanch, MD 11/19/22 [phone]

## 2022-11-19 NOTE — Progress Notes (Signed)
*  PRELIMINARY RESULTS* Echocardiogram 2D Echocardiogram has been performed.  Steve Buck 11/19/2022, 2:24 PM

## 2022-11-19 NOTE — Consult Note (Signed)
[facility] SURGICAL CONSULTATION NOTE (initial) - [id number]   HISTORY OF PRESENT ILLNESS (HPI):  67 y.o. male presented to [facility] ED overnight for evaluation of emesis. Patient reports the acute onset of nausea and emesis around 0300 on Tuesday morning. He is unsure the trigger of this and denied any inciting exposures. He did note diaphoresis and chills with these episodes. Noted that he eventually developed epigastric to left sided abdominal discomfort which he felt was secondary to episodes of vomiting. At the time of my evaluation, he reported the pain resolved. I directly asked about RUQ, which he denied. He denied any history of similar. No previous abdominal surgery. Does have a relatively significant cardiac and comorbid history including Atrial fibrillation, HTN, HFpEF, DM, CKD. Work up in the ED revealed a mild leukocytosis to 15.4K, renal function to baseline with sCr - 1.60, hypokalemia to 2.9,  high sensitivity troponin elevation to 75 (peaked at 107 currently), and mild lactic acidosis to 2.1 (now 2.5). He did have CTA which was reassuring. He did get RUQ [place] which showed gallbladder sludge but no changes to the gallbladder to suggest cholecystitis. He was admitted to medicine service. Of note, also found to be in hypertensive emergency.   Surgery is consulted by hospitalist physician Dr. Ivor Costa, MD in this context for evaluation and management of possible cholecystitis.  PAST MEDICAL HISTORY (PMH):  Past Medical History:  Diagnosis Date   (HFpEF) heart failure with preserved ejection fraction ([place])    a. 12/2017 Echo: EF 60-65%, no rwma, Gr1DD, mild MR, mildly dil LA, nl RV fxn.   Acute respiratory failure with hypoxia ([place]) 07/13/2022   Allergy    Seasonal   Chest pain    a. 12/2017 Lexiscan MV: EF 37% (nl by echo). Small mild, fixed region of apical thinning, likely attenuation artifact. No ischemia. Low risk.   CKD (chronic kidney disease) stage 3, GFR 30-59  ml/min (HCC) 03/30/2018   Diabetic nephropathy associated with type 2 diabetes mellitus ([place]) 03/25/2018   DKA (diabetic ketoacidoses) 12/01/2017   Heart murmur    a. 12/2017 Echo: Mild MR.   History of Bell's palsy 03/30/2018   Hypertension    Morbid obesity ([place])    Neuropathy 01/31/2014   NSTEMI (non-ST elevated myocardial infarction) ([place]) 11/19/2022   Palpitations    PVC's (premature ventricular contractions)    a. 05/2018 Holter: RSR, avg rate of 60. Freq PVC's (6.4% burden). Some ventricular bigeminy/trigeminy.   Sepsis ([place]) 07/13/2022   Type 2 diabetes mellitus, uncontrolled, with neuropathy      PAST SURGICAL HISTORY ([place]):  Past Surgical History:  Procedure Laterality Date   colonoscvopy  2015   FOOT SURGERY  around [place] N/A 07/08/2018   Procedure: LOOP RECORDER INSERTION;  Surgeon: Deboraha Sprang, MD;  Location: [facility];  Service: Cardiovascular;  Laterality: N/A;     MEDICATIONS:  Prior to Admission medications   Medication Sig Start Date End Date Taking? Authorizing Provider  acetaminophen (TYLENOL) 325 MG tablet Take 2 tablets (650 mg total) by mouth every 6 (six) hours as needed for mild pain, fever or headache. 07/17/22  Yes Nicole Kindred A, DO  allopurinol (ZYLOPRIM) 300 MG tablet Take 0.5 tablets (150 mg total) by mouth daily. 03/17/19  Yes Tukov-Yual, Magdalene S, NP  ascorbic acid (VITAMIN C) 500 MG tablet Take 2,000 mg by mouth daily.   Yes [provider]  atorvastatin (LIPITOR) 40 MG tablet Take 40  mg by mouth every evening.   Yes [provider]  b complex vitamins capsule Take 1 capsule by mouth daily.   Yes [provider]  busPIRone (BUSPAR) 15 MG tablet Take 15 mg by mouth 2 (two) times daily.   Yes [provider]  cloNIDine (CATAPRES) 0.3 MG tablet Take 1 tablet (0.3 mg total) by mouth 2 (two) times daily. 02/03/19  Yes Tukov-Yual, Magdalene S, NP  DULoxetine (CYMBALTA) 20 MG  capsule Take 40 mg by mouth daily.   Yes [provider]  fluticasone (FLONASE) 50 MCG/ACT nasal spray Place 2 sprays into both nostrils daily. 07/18/22  Yes Nicole Kindred A, DO  gabapentin (NEURONTIN) 300 MG capsule Take 1 capsule (300 mg total) by mouth 3 (three) times daily. Home med. Patient taking differently: Take 600 mg by mouth 2 (two) times daily. 06/16/21  Yes Enzo Bi, MD  hydrALAZINE (APRESOLINE) 50 MG tablet Take 50 mg by mouth 2 (two) times daily.   Yes [provider]  loratadine (CLARITIN) 10 MG tablet Take 1 tablet (10 mg total) by mouth daily. 07/18/22  Yes Nicole Kindred A, DO  losartan (COZAAR) 100 MG tablet Take 1 tablet (100 mg total) by mouth daily. 07/17/22 07/17/23 Yes Nicole Kindred A, DO  metoprolol tartrate (LOPRESSOR) 25 MG tablet TAKE 1 TABLET BY MOUTH TWICE A DAY 06/08/20  Yes Deboraha Sprang, MD  amLODipine (NORVASC) 10 MG tablet Take 1 tablet (10 mg total) by mouth daily. Patient not taking: Reported on 11/19/2022 07/18/22   Nicole Kindred A, DO  chlorpheniramine-HYDROcodone (TUSSIONEX) 10-8 MG/5ML Take 5 mLs by mouth every 12 (twelve) hours as needed for cough. Patient not taking: Reported on 11/19/2022 07/17/22   Nicole Kindred A, DO  cholecalciferol (VITAMIN D3) 25 MCG (1000 UNIT) tablet Take 1,000 Units by mouth daily. Patient not taking: Reported on 11/19/2022    [provider]  glucose blood test strip 1 each by Other route 3 (three) times daily. Use as instructed 02/03/19   Tukov-Yual, Arlyss Gandy, NP  spironolactone (ALDACTONE) 25 MG tablet Take 25 mg by mouth daily. Patient not taking: Reported on 11/19/2022 09/30/22 09/30/23  [provider]  traMADol (ULTRAM) 50 MG tablet Take 50 mg by mouth every 6 (six) hours as needed for moderate pain. Patient not taking: Reported on 11/19/2022 09/30/22   [provider]     ALLERGIES:  Allergies  Allergen Reactions   Lisinopril Cough and Rash   Metformin Nausea Only     SOCIAL  HISTORY:  Social History   Socioeconomic History   Marital status: Married    Spouse name: Not on file   Number of children: 3   Years of education: Not on file   Highest education level: Not on file  Occupational History   Occupation: retired    Comment: Delivery driver-produce  Tobacco Use   Smoking status: Never   Smokeless tobacco: Never  Vaping Use   Vaping Use: Never used  Substance and Sexual Activity   Alcohol use: Not Currently   Drug use: Yes    Types: Marijuana    Comment: occasionally   Sexual activity: Not on file  Other Topics Concern   Not on file  Social History Narrative   Pt said that everything was going well.    Said the main stress for him is paying for medical care, but things are going well here.   No alcohol use noted.  He does use marijuana regularly per the medical  record.   Social Determinants of Health   Financial Resource Strain: Medium Risk (03/25/2018)   Overall Financial Resource Strain (CARDIA)    Difficulty of Paying Living Expenses: Somewhat hard  Food Insecurity: No Food Insecurity (03/25/2018)   Hunger Vital Sign    Worried About Running Out of Food in the Last Year: Never true    Ran Out of Food in the Last Year: Never true  Transportation Needs: No Transportation Needs (03/25/2018)   PRAPARE - Hydrologist (Medical): No    Lack of Transportation (Non-Medical): No  Physical Activity: Insufficiently Active (03/25/2018)   Exercise Vital Sign    Days of Exercise per Week: 5 days    Minutes of Exercise per Session: 20 min  Stress: Stress Concern Present (03/25/2018)   [place]    Feeling of Stress : To some extent  Social Connections: Moderately Integrated (03/25/2018)   Social Connection and Isolation Panel [NHANES]    Frequency of Communication with Friends and Family: Three times a week    Frequency of Social Gatherings with Friends and  Family: Twice a week    Attends Religious Services: More than 4 times per year    Active Member of [employer] or Organizations: No    Attends Archivist Meetings: Never    Marital Status: Married  Human resources officer Violence: Not At Risk (03/25/2018)   Humiliation, Afraid, Rape, and Kick questionnaire    Fear of Current or Ex-Partner: No    Emotionally Abused: No    Physically Abused: No    Sexually Abused: No     FAMILY HISTORY:  Family History  Problem Relation Age of Onset   Cancer Mother        Skin   CAD Father    Arthritis Father       REVIEW OF SYSTEMS:  Review of Systems  Constitutional:  Positive for chills and diaphoresis. Negative for fever.  Respiratory:  Negative for cough and shortness of breath.   Cardiovascular:  Negative for chest pain and palpitations.  Gastrointestinal:  Positive for nausea (Now resolved) and vomiting (Now resolved). Negative for abdominal pain.  Genitourinary:  Negative for dysuria and urgency.  All other systems reviewed and are negative.   VITAL SIGNS:  Temp:  [98 F (36.7 C)-98.4 F (36.9 C)] 98.4 F (36.9 C) (03/06 1000) Pulse Rate:  [57-188] 57 (03/06 1145) Resp:  [10-30] 20 (03/06 1145) BP: (117-218)/(71-141) 123/71 (03/06 1145) SpO2:  [88 %-98 %] 93 % (03/06 1145) Weight:  [124.7 kg] 124.7 kg (03/06 0220)     Height: '6\' 1"'$  (185.4 cm) Weight: 124.7 kg BMI (Calculated): 36.29   INTAKE/OUTPUT:  No intake/output data recorded.  PHYSICAL EXAM:  Physical Exam Vitals and nursing note reviewed. Exam conducted with a chaperone present.  Constitutional:      Appearance: He is well-developed. He is obese.     Comments: Patient resting in bed; appears comfortable; NAD  HENT:     Head: Normocephalic and atraumatic.  Eyes:     General: No scleral icterus.    Extraocular Movements: Extraocular movements intact.  Cardiovascular:     Rate and Rhythm: Normal rate.     Heart sounds: Normal heart sounds.  Pulmonary:     Effort:  Pulmonary effort is normal. No respiratory distress.  Abdominal:     General: Abdomen is protuberant. There is no distension.     Palpations: Abdomen is soft.  Tenderness: There is no abdominal tenderness. There is no guarding or rebound. Negative signs include Murphy's sign.     Hernia: No hernia is present.     Comments: Abdomen is obese, soft, non-tender, non-distended, no rebound/guarding. Murphy's Sign is grossly negative; absolutely no tenderness in RUQ  Genitourinary:    Comments: Deferred Skin:    General: Skin is warm and dry.     Coloration: Skin is not jaundiced or pale.  Neurological:     General: No focal deficit present.     Mental Status: He is alert and oriented to person, place, and time.  Psychiatric:        Mood and Affect: Mood normal.        Behavior: Behavior normal.      Labs:     Latest Ref Rng & Units 11/19/2022    2:53 AM 07/17/2022    5:44 AM 07/16/2022    7:10 AM  CBC  WBC 4.0 - 10.5 K/uL 4.0 - 10.5 K/uL 15.4    15.4  12.4  11.1   Hemoglobin 13.0 - 17.0 g/dL 13.0 - 17.0 g/dL 16.9    16.8  13.6  12.3   Hematocrit 39.0 - 52.0 % 39.0 - 52.0 % 49.8    50.0  40.6  36.5   Platelets 150 - 400 K/uL 150 - 400 K/uL 377    396  337  298       Latest Ref Rng & Units 11/19/2022    4:10 AM 07/17/2022    5:44 AM 07/16/2022    7:10 AM  CMP  Glucose 70 - 99 mg/dL 188  108  113   BUN 8 - 23 mg/dL '24  29  31   '$ Creatinine 0.61 - 1.24 mg/dL 1.63  1.60  1.55   Sodium 135 - 145 mmol/L 140  139  140   Potassium 3.5 - 5.1 mmol/L 2.9  3.4  3.0   Chloride 98 - 111 mmol/L 103  98  100   CO2 22 - 32 mmol/L '24  31  31   '$ Calcium 8.9 - 10.3 mg/dL 9.5  9.2  8.9   Total Protein 6.5 - 8.1 g/dL 7.9     Total Bilirubin 0.3 - 1.2 mg/dL 1.4     Alkaline Phos 38 - 126 U/L 95     AST 15 - 41 U/L 31     ALT 0 - 44 U/L 25        Imaging studies:   CTA Chest/Abdomen/Pelvis (11/19/2022) personally reviewed without any changes to the gallbladder to suggest cholecystitis, no  other intra-abdominal findings, and radiologist report reviewed below: IMPRESSION: 1. Negative for aortic aneurysm or dissection. Positive for coronary artery disease, Aortic Atherosclerosis (ICD10-I70.0). 2. No acute or inflammatory process identified in the chest, abdomen, or pelvis. Resolved right lung Pneumonia since last year. 3. 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   RUQ [place] (11/19/2022) personally reviewed with small amount of sludge, no stones, no changes to suggest cholecystitis, and radiologist report reviewed below:  IMPRESSION: 1. Study is positive for biliary sludge in the gallbladder including small sludge balls, but no findings to suggest an acute cholecystitis at this time.   Assessment/Plan: (ICD-10's: K31.8) 67 y.o. male presenting with (now resolved) nausea, emesis, and epigastric/LUQ abdominal pain incidentally found to have gallbladder sludge without any other findings to suggest cholecystitis complicated by significant cardiac history and HTN emergency   -  From a gallbladder perspective, I am not convince the gallbladder is the culprit of his presentation and there certainly is no evidence to suggest cholecystitis at this time. His symptoms have resolved and he has absolutely no RUQ tenderness nor pain. He does have a leukocytosis and lactic acidosis, which could be explained by vomiting potentially. Question whether or not he may have some component of gastroenteritis. One potential option would be to obtain HIDA scan to definitively rule input cystic duct obstruction, although my suspicion is low. If his pain returns, would consider obtaining HIDA. He does have very mild hyperbilirubinemia; however, I am not sure about his ability to undergo MRI given implanted loop recorder. Trend this. Okay for diet. Will follow peripherally.   All of the above findings and recommendations were  discussed with the patient, and all of patient's questions were answered to his expressed satisfaction.  Thank you for the opportunity to participate in this patient's care.   -- Edison Simon, PA-C [facility] 11/19/2022, 12:10 PM M-F: 7am - 4pm

## 2022-11-19 NOTE — Progress Notes (Signed)
Pharmacy Antibiotic Note  Steve Reth. is a 67 y.o. male admitted on 11/19/2022 with  possible cholecystitis .  Pharmacy has been consulted for Zosyn (piperacillin/tazobactam) dosing.  Plan: Zosyn (piperacillin/tazobactam) 3.375 grams every 8 hours (4 hour infusion)  Height: '6\' 1"'$  (185.4 cm) Weight: 124.7 kg (275 lb) IBW/kg (Calculated) : 79.9  Temp (24hrs), Avg:98.2 F (36.8 C), Min:98 F (36.7 C), Max:98.4 F (36.9 C)  Recent Labs  Lab 11/19/22 0253 11/19/22 0410 11/19/22 1005 11/19/22 1147  WBC 15.4*  15.4*  --   --   --   CREATININE  --  1.63*  --   --   LATICACIDVEN  --   --  2.1* 2.5*    Estimated Creatinine Clearance: 60 mL/min (A) (by C-G formula based on SCr of 1.63 mg/dL (H)).    Allergies  Allergen Reactions   Lisinopril Cough and Rash   Metformin Nausea Only    Antimicrobials this admission: Zosyn 3/6 >>   Dose adjustments this admission: N/a  Microbiology results: 3/6 BCx: ordered  Thank you for allowing pharmacy to be a part of this patient's care.  Glean Salvo, PharmD, BCPS Clinical Pharmacist  11/19/2022 1:20 PM

## 2022-11-20 ENCOUNTER — Encounter: Payer: Self-pay | Admitting: Internal Medicine

## 2022-11-20 DIAGNOSIS — D649 Anemia, unspecified: Secondary | ICD-10-CM

## 2022-11-20 DIAGNOSIS — R112 Nausea with vomiting, unspecified: Secondary | ICD-10-CM

## 2022-11-20 DIAGNOSIS — E1121 Type 2 diabetes mellitus with diabetic nephropathy: Secondary | ICD-10-CM

## 2022-11-20 DIAGNOSIS — N1832 Chronic kidney disease, stage 3b: Secondary | ICD-10-CM

## 2022-11-20 DIAGNOSIS — I2510 Atherosclerotic heart disease of native coronary artery without angina pectoris: Secondary | ICD-10-CM

## 2022-11-20 DIAGNOSIS — K828 Other specified diseases of gallbladder: Secondary | ICD-10-CM | POA: Diagnosis not present

## 2022-11-20 DIAGNOSIS — R1013 Epigastric pain: Secondary | ICD-10-CM | POA: Diagnosis not present

## 2022-11-20 DIAGNOSIS — I161 Hypertensive emergency: Secondary | ICD-10-CM | POA: Diagnosis not present

## 2022-11-20 DIAGNOSIS — E1122 Type 2 diabetes mellitus with diabetic chronic kidney disease: Secondary | ICD-10-CM

## 2022-11-20 DIAGNOSIS — R7989 Other specified abnormal findings of blood chemistry: Secondary | ICD-10-CM

## 2022-11-20 DIAGNOSIS — E876 Hypokalemia: Secondary | ICD-10-CM

## 2022-11-20 DIAGNOSIS — I4891 Unspecified atrial fibrillation: Secondary | ICD-10-CM

## 2022-11-20 DIAGNOSIS — I2489 Other forms of acute ischemic heart disease: Secondary | ICD-10-CM

## 2022-11-20 DIAGNOSIS — E872 Acidosis, unspecified: Secondary | ICD-10-CM | POA: Insufficient documentation

## 2022-11-20 DIAGNOSIS — I214 Non-ST elevation (NSTEMI) myocardial infarction: Secondary | ICD-10-CM | POA: Diagnosis not present

## 2022-11-20 HISTORY — DX: Anemia, unspecified: D64.9

## 2022-11-20 LAB — BASIC METABOLIC PANEL
Anion gap: 8 (ref 5–15)
BUN: 33 mg/dL — ABNORMAL HIGH (ref 8–23)
CO2: 23 mmol/L (ref 22–32)
Calcium: 8.5 mg/dL — ABNORMAL LOW (ref 8.9–10.3)
Chloride: 108 mmol/L (ref 98–111)
Creatinine, Ser: 1.88 mg/dL — ABNORMAL HIGH (ref 0.61–1.24)
GFR, Estimated: 38 mL/min — ABNORMAL LOW (ref >60)
Glucose, Bld: 101 mg/dL — ABNORMAL HIGH (ref 70–99)
Potassium: 3.7 mmol/L (ref 3.5–5.1)
Sodium: 139 mmol/L (ref 135–145)

## 2022-11-20 LAB — LIPID PANEL
Cholesterol: 127 mg/dL (ref 0–200)
HDL: 28 mg/dL — ABNORMAL LOW (ref >40)
LDL Cholesterol: 71 mg/dL (ref 0–99)
Total CHOL/HDL Ratio: 4.5 RATIO
Triglycerides: 140 mg/dL (ref <150)
VLDL: 28 mg/dL (ref 0–40)

## 2022-11-20 LAB — TROPONIN I (HIGH SENSITIVITY)
Troponin I (High Sensitivity): 118 ng/L (ref <18)
Troponin I (High Sensitivity): 106 ng/L (ref <18)
Troponin I (High Sensitivity): 91 ng/L — ABNORMAL HIGH (ref <18)
Troponin I (High Sensitivity): 81 ng/L — ABNORMAL HIGH (ref <18)

## 2022-11-20 LAB — CBG MONITORING, ED: Glucose-Capillary: 124 mg/dL — ABNORMAL HIGH (ref 70–99)

## 2022-11-20 LAB — CBC
HCT: 37.6 % — ABNORMAL LOW (ref 39.0–52.0)
Hemoglobin: 12.3 g/dL — ABNORMAL LOW (ref 13.0–17.0)
MCH: 30.8 pg (ref 26.0–34.0)
MCHC: 32.7 g/dL (ref 30.0–36.0)
MCV: 94.2 fL (ref 80.0–100.0)
Platelets: 256 10*3/uL (ref 150–400)
RBC: 3.99 MIL/uL — ABNORMAL LOW (ref 4.22–5.81)
RDW: 16 % — ABNORMAL HIGH (ref 11.5–15.5)
WBC: 10.6 10*3/uL — ABNORMAL HIGH (ref 4.0–10.5)
nRBC: 0 % (ref 0.0–0.2)

## 2022-11-20 LAB — HEMOGLOBIN A1C
Hgb A1c MFr Bld: 6.3 % — ABNORMAL HIGH (ref 4.8–5.6)
Mean Plasma Glucose: 134 mg/dL

## 2022-11-20 LAB — LACTIC ACID, PLASMA: Lactic Acid, Venous: 1.6 mmol/L (ref 0.5–1.9)

## 2022-11-20 LAB — HIV ANTIBODY (ROUTINE TESTING W REFLEX): HIV Screen 4th Generation wRfx: NONREACTIVE

## 2022-11-20 MED ORDER — ASPIRIN 81 MG PO TBEC: 81.0000 mg | DELAYED_RELEASE_TABLET | Freq: Every day | ORAL | 12 refills | Status: AC

## 2022-11-20 MED ORDER — LOSARTAN POTASSIUM 100 MG PO TABS: 100.0000 mg | ORAL_TABLET | Freq: Every day | ORAL | 2 refills | Status: AC

## 2022-11-20 MED ORDER — ISOSORBIDE MONONITRATE ER 60 MG PO TB24: 60.0000 mg | ORAL_TABLET | Freq: Every day | ORAL | 0 refills | Status: AC

## 2022-11-20 MED ORDER — PANTOPRAZOLE SODIUM 40 MG PO TBEC: 40.0000 mg | DELAYED_RELEASE_TABLET | Freq: Every day | ORAL | 0 refills | Status: AC

## 2022-11-20 MED ORDER — AMOXICILLIN-POT CLAVULANATE 875-125 MG PO TABS: 1.0000 | ORAL_TABLET | Freq: Two times a day (BID) | ORAL | 0 refills | Status: AC

## 2022-11-20 MED ORDER — HYDRALAZINE HCL 50 MG PO TABS: 50.0000 mg | ORAL_TABLET | Freq: Three times a day (TID) | ORAL | 0 refills | Status: DC

## 2022-11-20 MED ORDER — AMOXICILLIN-POT CLAVULANATE 875-125 MG PO TABS: 1.0000 | ORAL_TABLET | Freq: Two times a day (BID) | ORAL | Status: DC

## 2022-11-20 MED ORDER — CARVEDILOL 12.5 MG PO TABS: 12.5000 mg | ORAL_TABLET | Freq: Two times a day (BID) | ORAL | 0 refills | Status: AC

## 2022-11-20 MED ORDER — ONDANSETRON HCL 4 MG PO TABS: 4.0000 mg | ORAL_TABLET | Freq: Four times a day (QID) | ORAL | 0 refills | Status: AC | PRN

## 2022-11-20 NOTE — Discharge Summary (Signed)
Physician Discharge Summary  Steve Buck. [id number] DOB: 05-07-1956 DOA: 11/19/2022  PCP: Kirk Ruths, MD  Admit date: 11/19/2022 Discharge date: 11/20/2022 Admitted From: Home Disposition: Home Recommendations for Outpatient Follow-up:  Follow up with PCP in 1 to 2 weeks. Cardiology to arrange outpatient follow-up Check BMP, CBC and CMP at follow-up Please follow up on the following pending results: None  Home Health: Not indicated. Equipment/Devices: Not indicated  Discharge Condition: Stable CODE STATUS: Full code  Follow-up Information     Kirk Ruths, MD. Schedule an appointment as soon as possible for a visit in 1 week(s).   Specialty: Internal Medicine Contact information: [place] [address] [facility] 68 year old M with PMH of diastolic CHF, [id number], DM-2, HTN, HLD, gout, depression and morbid obesity presenting with epigastric abdominal pain with associated nausea, dry heaving and shortness of breath for 2 days, and admitted for hypertensive crisis with BP elevated to 211/114.  K2.9.  Lactic acid 2.1 >> 2.5.  CT angio chest/abdomen/pelvis negative for dissection or significant finding.  RUQ [place] with sludge but no signs of cholecystitis.  Troponin elevated to 61 and trended to 107.  Patient was started on nitro drip and IV Zosyn.  Cardiology and general surgery consulted.   On further interview, patient reports eating from restaurant preceding his symptoms.   On the day of discharge, patient's blood pressure normalized.  Weaned off nitro drip.  Symptoms, hypokalemia and lactic acidosis resolved.  Exam without significant finding.  Cardiology adjusted cardiac medication by adding Imdur and changing metoprolol to Coreg. He is cleared for discharge by cardiology and general surgery.  He is discharged on p.o. Augmentin for possible infectious  process/gastroenteritis.    With him movement in his BP and introduction of Coreg and Imdur, advised to hold losartan for about a week until he has renal function rechecked.  Note that patient's hemoglobin dropped about 4 g which is dilutional from IV fluid.  Patient was dehydrated due to nausea and dry heaving on presentation.  Recommend repeat CBC at follow-up.   See individual problem list below for more.   Problems addressed during this hospitalization Principal Problem:   Hypertensive emergency Active Problems:   Abdominal pain   Gallbladder sludge   Type II diabetes mellitus with renal manifestations (HCC)   Gout   Elevated lactic acid level   Obesity (BMI 30-39.9)   Chronic kidney disease, stage 3b ([place])   Diabetic nephropathy associated with type 2 diabetes mellitus (HCC)   Atrial fibrillation (HCC)   Morbid obesity (HCC)   Elevated troponin   Coronary artery disease involving native coronary artery of native heart   Nausea and vomiting   Demand ischemia   Normocytic anemia   Lactic acidosis              Vital signs Vitals:   11/20/22 0557 11/20/22 0800 11/20/22 0900 11/20/22 1100  BP:  127/86 128/78 108/67  Pulse: 83 62 80 62  Temp: 98.2 F (36.8 C)     Resp: 14 (!) 21 20 (!) 21  Height:      Weight:      SpO2: 97% 96% 97% 98%  TempSrc: Oral     BMI (Calculated):         Discharge exam  GENERAL: No apparent distress.  Nontoxic. HEENT: MMM.  Vision  and hearing grossly intact.  NECK: Supple.  No apparent JVD.  RESP:  No IWOB.  Fair aeration bilaterally. CVS:  RRR. Heart sounds normal.  ABD/GI/GU: BS+. Abd soft, NTND.  MSK/EXT:  Moves extremities. No apparent deformity. No edema.  SKIN: no apparent skin lesion or wound NEURO: Awake and alert. Oriented appropriately.  No apparent focal neuro deficit. PSYCH: Calm. Normal affect.   Discharge Instructions Discharge Instructions     Call MD for:  difficulty breathing, headache or visual  disturbances   Complete by: As directed    Call MD for:  persistant dizziness or light-headedness   Complete by: As directed    Call MD for:  persistant nausea and vomiting   Complete by: As directed    Diet - low sodium heart healthy   Complete by: As directed    Discharge instructions   Complete by: As directed    It has been a pleasure taking care of you!  You were hospitalized due to abdominal pain, nausea, dry heaving, shortness of breath and markedly elevated blood pressure.  Unclear what caused his symptoms.  Could be related to the restaurant food you ate.  We have made changes to your blood pressure medication during this hospitalization.  We have also started you on antibiotics for possible infection.  Please review your new medication list and the directions on your medications before you take them.  Follow-up with your primary care doctor in 1 to 2 weeks or sooner if needed.  Avoid any over-the-counter pain medication other than plain Tylenol.   We recommend you avoid marijuana since it can contribute to cyclic nausea and vomiting.   Take care,   Increase activity slowly   Complete by: As directed       Allergies as of 11/20/2022       Reactions   Lisinopril Cough, Rash   Metformin Nausea Only        Medication List     STOP taking these medications    amLODipine 10 MG tablet Commonly known as: NORVASC   chlorpheniramine-HYDROcodone 10-8 MG/5ML Commonly known as: TUSSIONEX   metoprolol tartrate 25 MG tablet Commonly known as: LOPRESSOR   spironolactone 25 MG tablet Commonly known as: ALDACTONE       TAKE these medications    acetaminophen 325 MG tablet Commonly known as: TYLENOL Take 2 tablets (650 mg total) by mouth every 6 (six) hours as needed for mild pain, fever or headache.   allopurinol 300 MG tablet Commonly known as: ZYLOPRIM Take 0.5 tablets (150 mg total) by mouth daily.   amoxicillin-clavulanate 875-125 MG tablet Commonly known as:  AUGMENTIN Take 1 tablet by mouth every 12 (twelve) hours for 5 days.   ascorbic acid 500 MG tablet Commonly known as: VITAMIN C Take 2,000 mg by mouth daily.   aspirin EC 81 MG tablet Take 1 tablet (81 mg total) by mouth daily. Swallow whole. Start taking on: November 21, 2022   atorvastatin 40 MG tablet Commonly known as: LIPITOR Take 40 mg by mouth every evening.   b complex vitamins capsule Take 1 capsule by mouth daily.   busPIRone 15 MG tablet Commonly known as: BUSPAR Take 15 mg by mouth 2 (two) times daily.   carvedilol 12.5 MG tablet Commonly known as: COREG Take 1 tablet (12.5 mg total) by mouth 2 (two) times daily with a meal.   cholecalciferol 25 MCG (1000 UNIT) tablet Commonly known as: VITAMIN D3 Take 1,000 Units by mouth daily.  cloNIDine 0.3 MG tablet Commonly known as: CATAPRES Take 1 tablet (0.3 mg total) by mouth 2 (two) times daily.   DULoxetine 20 MG capsule Commonly known as: CYMBALTA Take 40 mg by mouth daily.   fluticasone 50 MCG/ACT nasal spray Commonly known as: FLONASE Place 2 sprays into both nostrils daily.   gabapentin 300 MG capsule Commonly known as: NEURONTIN Take 1 capsule (300 mg total) by mouth 3 (three) times daily. Home med. What changed:  how much to take when to take this additional instructions   glucose blood test strip 1 each by Other route 3 (three) times daily. Use as instructed   hydrALAZINE 50 MG tablet Commonly known as: APRESOLINE Take 1 tablet (50 mg total) by mouth 3 (three) times daily. What changed: when to take this   isosorbide mononitrate 60 MG 24 hr tablet Commonly known as: IMDUR Take 1 tablet (60 mg total) by mouth daily. Start taking on: November 21, 2022   loratadine 10 MG tablet Commonly known as: CLARITIN Take 1 tablet (10 mg total) by mouth daily.   losartan 100 MG tablet Commonly known as: Cozaar Take 1 tablet (100 mg total) by mouth daily. Start taking on: November 27, 2022 What changed:  These instructions start on November 27, 2022. If you are unsure what to do until then, ask your doctor or other care provider.   ondansetron 4 MG tablet Commonly known as: Zofran Take 1 tablet (4 mg total) by mouth every 6 (six) hours as needed for up to 5 days for nausea or vomiting.   pantoprazole 40 MG tablet Commonly known as: Protonix Take 1 tablet (40 mg total) by mouth daily.   traMADol 50 MG tablet Commonly known as: ULTRAM Take 50 mg by mouth every 6 (six) hours as needed for moderate pain.        Consultations: Cardiology General surgery  Procedures/Studies:   ECHOCARDIOGRAM COMPLETE  Result Date: 11/19/2022    ECHOCARDIOGRAM REPORT   Patient Name:   Steve Buck. Date of Exam: 11/19/2022 Medical Rec #:  [id number]             Height:       73.0 in Accession #:    [id number]            Weight:       275.0 lb Date of Birth:  1955/12/25             BSA:          2.463 m Patient Age:    7 years              BP:           123/71 mmHg Patient Gender: M                     HR:           57 bpm. Exam Location:  [facility] Procedure: 2D Echo, Cardiac Doppler and Color Doppler Indications:     Chest pain R07.9  History:         Patient has prior history of Echocardiogram examinations, most                  recent 06/16/2021. Signs/Symptoms:Murmur; Risk                  Factors:Hypertension and Diabetes. NSTEMI.  Sonographer:     Sherrie Sport Referring Phys:  [id number] Kate Sable Diagnosing Phys: Kate Sable MD  IMPRESSIONS  1. Left ventricular ejection fraction, by estimation, is 55 to 60%. The left ventricle has normal function. The left ventricle has no regional wall motion abnormalities. There is mild left ventricular hypertrophy. Left ventricular diastolic parameters are consistent with Grade I diastolic dysfunction (impaired relaxation).  2. Right ventricular systolic function is normal. The right ventricular size is normal.  3. Left atrial size was mildly dilated.  4. The mitral  valve is normal in structure. No evidence of mitral valve regurgitation.  5. The aortic valve was not well visualized. Aortic valve regurgitation is not visualized. Aortic valve sclerosis/calcification is present, without any evidence of aortic stenosis. FINDINGS  Left Ventricle: Left ventricular ejection fraction, by estimation, is 55 to 60%. The left ventricle has normal function. The left ventricle has no regional wall motion abnormalities. The left ventricular internal cavity size was normal in size. There is  mild left ventricular hypertrophy. Left ventricular diastolic parameters are consistent with Grade I diastolic dysfunction (impaired relaxation). Right Ventricle: The right ventricular size is normal. No increase in right ventricular wall thickness. Right ventricular systolic function is normal. Left Atrium: Left atrial size was mildly dilated. Right Atrium: Right atrial size was normal in size. Pericardium: There is no evidence of pericardial effusion. Mitral Valve: The mitral valve is normal in structure. No evidence of mitral valve regurgitation. Tricuspid Valve: The tricuspid valve is not well visualized. Tricuspid valve regurgitation is not demonstrated. Aortic Valve: The aortic valve was not well visualized. Aortic valve regurgitation is not visualized. Aortic valve sclerosis/calcification is present, without any evidence of aortic stenosis. Aortic valve mean gradient measures 3.5 mmHg. Aortic valve peak gradient measures 5.2 mmHg. Aortic valve area, by VTI measures 4.26 cm. Pulmonic Valve: The pulmonic valve was not well visualized. Pulmonic valve regurgitation is not visualized. Aorta: The aortic root is normal in size and structure. Venous: The inferior vena cava was not well visualized. IAS/Shunts: No atrial level shunt detected by color flow Doppler.  LEFT VENTRICLE PLAX 2D LVOT diam:     2.30 cm   Diastology LV SV:         88        LV e' medial:    4.24 cm/s LV SV Index:   36        LV E/e'  medial:  13.7 LVOT Area:     4.15 cm  LV e' lateral:   4.90 cm/s                          LV E/e' lateral: 11.9  RIGHT VENTRICLE RV Basal diam:  2.90 cm RV Mid diam:    2.90 cm RV S prime:     11.90 cm/s TAPSE (M-mode): 2.4 cm LEFT ATRIUM             Index        RIGHT ATRIUM           Index LA Vol (A2C):   61.5 ml 24.96 ml/m  RA Area:     10.70 cm LA Vol (A4C):   93.6 ml 38.00 ml/m  RA Volume:   14.10 ml  5.72 ml/m LA Biplane Vol: 79.3 ml 32.19 ml/m  AORTIC VALVE AV Area (Vmax):    3.99 cm AV Area (Vmean):   3.77 cm AV Area (VTI):     4.26 cm AV Vmax:           114.50 cm/s AV Vmean:  82.000 cm/s AV VTI:            0.207 m AV Peak Grad:      5.2 mmHg AV Mean Grad:      3.5 mmHg LVOT Vmax:         110.00 cm/s LVOT Vmean:        74.400 cm/s LVOT VTI:          0.212 m LVOT/AV VTI ratio: 1.02 MITRAL VALVE               TRICUSPID VALVE MV Area (PHT): 2.95 cm    TR Peak grad:   18.1 mmHg MV Decel Time: 257 msec    TR Vmax:        213.00 cm/s MV E velocity: 58.20 cm/s MV A velocity: 88.00 cm/s  SHUNTS MV E/A ratio:  0.66        Systemic VTI:  0.21 m                            Systemic Diam: 2.30 cm Kate Sable MD Electronically signed by Kate Sable MD Signature Date/Time: 11/19/2022/4:23:51 PM    Final    US ABDOMEN LIMITED RUQ (LIVER/GB)  Result Date: 11/19/2022 CLINICAL DATA:  67 year old male with history of upper abdominal pain. EXAM: ULTRASOUND ABDOMEN LIMITED RIGHT UPPER QUADRANT COMPARISON:  No priors. FINDINGS: Gallbladder: There is some amorphous echogenic but nonshadowing material lying dependently in the gallbladder, compatible with biliary sludge. Some well organized sludge balls are noted measuring up to 3 mm. Gallbladder is only moderately distended with normal wall thickness of 3 mm. No pericholecystic fluid. Per report from the sonographer, there was no sonographic Murphy's sign on examination. Common bile duct: Diameter: 6.2 mm. Liver: No focal lesion identified. Within  normal limits in parenchymal echogenicity. Portal vein is patent on color Doppler imaging with normal direction of blood flow towards the liver. Other: None. IMPRESSION: 1. Study is positive for biliary sludge in the gallbladder including small sludge balls, but no findings to suggest an acute cholecystitis at this time. Electronically Signed   By: Vinnie Langton M.D.   On: 11/19/2022 07:48   CT Angio Chest/Abd/Pel for Dissection W and/or Wo Contrast  Result Date: 11/19/2022 CLINICAL DATA:  67 year old male with severe chest and epigastric abdominal pain with vomiting. EXAM: CT ANGIOGRAPHY CHEST, ABDOMEN AND PELVIS TECHNIQUE: Non-contrast CT of the chest was initially obtained. Multidetector CT imaging through the chest, abdomen and pelvis was performed using the standard protocol during bolus administration of intravenous contrast. Multiplanar reconstructed images and MIPs were obtained and reviewed to evaluate the vascular anatomy. RADIATION DOSE REDUCTION: This exam was performed according to the departmental dose-optimization program which includes automated exposure control, adjustment of the mA and/or kV according to patient size and/or use of iterative reconstruction technique. CONTRAST:  158m OMNIPAQUE IOHEXOL 350 MG/ML SOLN COMPARISON:  Portable chest 0230 hours today. CTA chest 07/13/2022. FINDINGS: CTA CHEST FINDINGS Cardiovascular: Calcified coronary artery plaque or stents (series 3, image 31). Only trace thoracic aortic calcified plaque. Cardiac size within normal limits. No pericardial effusion. Following contrast both the thoracic aorta and main pulmonary arteries are opacified. No evidence of central or lobar pulmonary embolus. No thoracic aortic aneurysm or dissection. Unremarkable proximal great vessels. Mediastinum/Nodes: Negative. No mediastinal mass or lymphadenopathy. Lungs/Pleura: Major airways are patent. Stable to improved lung volumes compared to last year. Resolved multifocal right  lung airspace disease since the prior CTA. Minor  dependent atelectasis and/or scarring. No pleural effusion or acute lung finding. Musculoskeletal: Widespread thoracic interbody ankylosis from flowing endplate osteophytes. Stable mildly exaggerated thoracic kyphosis. No acute or suspicious osseous lesion identified. Chronic left AC joint degeneration. Review of the MIP images confirms the above findings. CTA ABDOMEN AND PELVIS FINDINGS VASCULAR Mildly tortuous abdominal aorta and iliac arteries with Aortoiliac calcified atherosclerosis. But negative for abdominal aortic aneurysm or dissection. Major aortic branches remain patent with no high-grade stenosis. Early contrast timing in the pelvis, grossly patent major iliac arteries. Review of the MIP images confirms the above findings. NON-VASCULAR Hepatobiliary: Negative liver and gallbladder. Pancreas: Negative. Spleen: Negative. Adrenals/Urinary Tract: Negative adrenal glands. Exophytic but simple fluid density left renal midpole cyst (no follow-up imaging recommended). Nonobstructed kidneys with symmetric renal enhancement. Smaller exophytic right renal cysts also appear simple (no follow-up imaging recommended). Decompressed ureters. Decompressed bladder. Mild pelvic phleboliths. Stomach/Bowel: Redundant large bowel but only mild retained stool. Descending and transverse diverticulosis is mild to moderate. No active inflammation. Normal retrocecal appendix on series 6, image 216. No dilated small bowel. Mostly decompressed stomach and duodenum. No free air or free fluid identified. Lymphatic: No lymphadenopathy. Reproductive: Negative. Other: No pelvis free fluid. Musculoskeletal: L4-L5 mild spondylolisthesis with lumbar disc and severe facet degeneration with evidence of gas containing synovial cysts larger on the right (series 6, image 220). Associated spinal stenosis, neural impingement at that level. No acute osseous abnormality identified. Review of the MIP  images confirms the above findings. IMPRESSION: 1. Negative for aortic aneurysm or dissection. Positive for coronary artery disease, Aortic Atherosclerosis (ICD10-I70.0). 2. No acute or inflammatory process identified in the chest, abdomen, or pelvis. Resolved right lung Pneumonia since last year. 3. 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 Electronically Signed   By: Genevie Ann M.D.   On: 11/19/2022 05:49   DG Chest 1 View  Result Date: 11/19/2022 CLINICAL DATA:  Chest and abdominal pain EXAM: PORTABLE CHEST 1 VIEW COMPARISON:  07/13/2022 FINDINGS: Cardiac shadow is stable. Loop recorder is again noted. Aortic calcifications are seen. Lungs are clear. No bony abnormality is noted. IMPRESSION: No active disease. Electronically Signed   By: Inez Catalina M.D.   On: 11/19/2022 02:36       The results of significant diagnostics from this hospitalization (including imaging, microbiology, ancillary and laboratory) are listed below for reference.     Microbiology: Recent Results (from the past 240 hour(s))  Culture, blood (Routine X 2) w Reflex to ID Panel     Status: None (Preliminary result)   Collection Time: 11/19/22  1:55 PM   Specimen: BLOOD  Result Value Ref Range Status   Specimen Description BLOOD BLOOD LEFT ARM  Final   Special Requests   Final    BOTTLES DRAWN AEROBIC AND ANAEROBIC Blood Culture adequate volume   Culture   Final    NO GROWTH < 24 HOURS Performed at [facility], [address], [place], [place] [address]    Report Status PENDING  Incomplete  Culture, blood (Routine X 2) w Reflex to ID Panel     Status: None (Preliminary result)   Collection Time: 11/19/22  1:55 PM   Specimen: BLOOD  Result Value Ref Range Status   Specimen Description BLOOD BLOOD RIGHT ARM  Final   Special Requests   Final    BOTTLES DRAWN AEROBIC AND ANAEROBIC Blood Culture results may not be optimal due to  an inadequate volume  of blood received in culture bottles   Culture   Final    NO GROWTH < 24 HOURS Performed at [facility], [address] [place], [place] [address]    Report Status PENDING  Incomplete     Labs:  CBC: Recent Labs  Lab 11/19/22 0253 11/20/22 0540  WBC 15.4*  15.4* 10.6*  NEUTROABS 9.2*  --   HGB 16.8  16.9 12.3*  HCT 50.0  49.8 37.6*  MCV 91.6  91.7 94.2  PLT 396  377 256   BMP &GFR Recent Labs  Lab 11/19/22 0402 11/19/22 0410 11/19/22 0845 11/20/22 0540  NA  --  140  --  139  K  --  2.9*  --  3.7  CL  --  103  --  108  CO2  --  24  --  23  GLUCOSE  --  188*  --  101*  BUN  --  24*  --  33*  CREATININE  --  1.63*  --  1.88*  CALCIUM  --  9.5  --  8.5*  MG 1.8  --   --   --   PHOS  --   --  2.9  --    Estimated Creatinine Clearance: 52 mL/min (A) (by C-G formula based on SCr of 1.88 mg/dL (H)). Liver & Pancreas: Recent Labs  Lab 11/19/22 0410  AST 31  ALT 25  ALKPHOS 95  BILITOT 1.4*  PROT 7.9  ALBUMIN 4.2   Recent Labs  Lab 11/19/22 0410  LIPASE 42   No results for input(s): "AMMONIA" in the last 168 hours. Diabetic: Recent Labs    11/19/22 0845  HGBA1C 6.3*   Recent Labs  Lab 11/20/22 0725  GLUCAP 124*   Cardiac Enzymes: No results for input(s): "CKTOTAL", "CKMB", "CKMBINDEX", "TROPONINI" in the last 168 hours. No results for input(s): "PROBNP" in the last 8760 hours. Coagulation Profile: Recent Labs  Lab 11/19/22 1005  INR 1.1   Thyroid Function Tests: No results for input(s): "TSH", "T4TOTAL", "FREET4", "T3FREE", "THYROIDAB" in the last 72 hours. Lipid Profile: Recent Labs    11/20/22 0540  CHOL 127  HDL 28*  LDLCALC 71  TRIG 140  CHOLHDL 4.5   Anemia Panel: No results for input(s): "VITAMINB12", "FOLATE", "FERRITIN", "TIBC", "IRON", "RETICCTPCT" in the last 72 hours. Urine analysis:    Component Value Date/Time   COLORURINE YELLOW (A) 11/19/2022 0534   APPEARANCEUR HAZY (A)  11/19/2022 0534   APPEARANCEUR Clear 03/30/2018 1856   LABSPEC 1.028 11/19/2022 0534   PHURINE 5.0 11/19/2022 0534   GLUCOSEU NEGATIVE 11/19/2022 0534   HGBUR NEGATIVE 11/19/2022 0534   BILIRUBINUR NEGATIVE 11/19/2022 0534   BILIRUBINUR Negative 03/30/2018 1856   KETONESUR NEGATIVE 11/19/2022 0534   PROTEINUR >=300 (A) 11/19/2022 0534   NITRITE NEGATIVE 11/19/2022 [place] NEGATIVE 11/19/2022 0534   Sepsis Labs: Invalid input(s): "PROCALCITONIN", "LACTICIDVEN"   SIGNED:  Mercy Riding, MD  [facility] 11/20/2022, 4:28 PM

## 2022-11-20 NOTE — ED Notes (Signed)
Cyndia Skeeters, MD request to remove patient's nitro drip.

## 2022-11-20 NOTE — Care Management CC44 (Signed)
Condition Code 44 Documentation Completed  Patient Details  Name: Steve Buck. MRN: [id number] Date of Birth: 1956/07/03   Condition Code 44 given:  Yes Patient signature on Condition Code 44 notice:  Yes Documentation of 2 MD's agreement:  Yes Code 44 added to claim:  Yes    Tiburcio Bash, LCSW 11/20/2022, 11:21 AM

## 2022-11-20 NOTE — Care Management Obs Status (Signed)
[facility] NOTIFICATION   Patient Details  Name: Steve Buck. MRN: [id number] Date of Birth: May 03, 1956   Medicare Observation Status Notification Given:  Yes    Tiburcio Bash, LCSW 11/20/2022, 11:21 AM

## 2022-11-20 NOTE — Progress Notes (Signed)
Patient Name: Steve Buck. Date of Encounter: 11/20/2022 [place] Cardiologist: Kathlyn Sacramento, MD   Interval Summary   Long discussion with him concerning recent events Reports eating Chinese food Monday, March 4 3 AM next morning Tuesday a.m. with vomiting, dry heaves, bilious Continued dry heaves for the next 2 days with abdominal discomfort Presenting to the ER March 6 with abdominal pain from dry heaving, diaphoretic Minimally elevated nontrending troponin of 100 Markedly hypertensive on arrival systolic pressures [id number] Placed back on many of his oral medications yesterday including carvedilol 12.5 twice daily in place of metoprolol Losartan 100 daily, isosorbide 60, hydralazine 50 3 times daily Amlodipine held Nitro infusion being weaned off this morning Reports he has eaten several meals, no nausea or vomiting, no abdominal pain Denies chest pain concerning for angina  CT scan chest reviewed showing coronary calcification  Vital Signs   Vitals:   11/20/22 0204 11/20/22 0300 11/20/22 0500 11/20/22 0557  BP: 127/76 123/70 136/77   Pulse: 78 78 89 83  Resp: (!) 21 (!) '21 17 14  '$ Temp:    98.2 F (36.8 C)  TempSrc:    Oral  SpO2: 97% 94% 94% 97%  Weight:      Height:        Intake/Output Summary (Last 24 hours) at 11/20/2022 0804 Last data filed at 11/20/2022 0622 Gross per 24 hour  Intake 1749.77 ml  Output 300 ml  Net 1449.77 ml      11/19/2022    2:20 AM 07/13/2022    5:13 PM 06/15/2021    5:56 PM  Last 3 Weights  Weight (lbs) 275 lb 279 lb 12.2 oz 240 lb 4.8 oz  Weight (kg) 124.739 kg 126.9 kg 109 kg      Telemetry/ECG    Normal sinus rhythm- Personally Reviewed  Physical Exam  Constitutional:  oriented to person, place, and time. No distress.  HENT:  Head: Grossly normal Eyes:  no discharge. No scleral icterus.  Neck: No JVD, no carotid bruits  Cardiovascular: Regular rate and rhythm, no murmurs appreciated Pulmonary/Chest: Clear  to auscultation bilaterally, no wheezes or rails Abdominal: Soft.  no distension.  no tenderness.  Musculoskeletal: Normal range of motion Neurological:  normal muscle tone. Coordination normal. No atrophy Skin: Skin warm and dry Psychiatric: normal affect, pleasant   Assessment & Plan    Gastroenteritis Ate Chinese food last Monday with nausea vomiting dry heaves starting 3 AM next morning, continuing for 2 additional days Symptoms now resolved, eating meals without symptoms  Demand ischemia /supply/demand mismatch In the setting of nausea vomiting/dry heaving, marked hypertension, LVH, underlying coronary disease on CT scan Peak troponin 102. Echo with no focal wall motion abnormality -Continue aspirin, Lipitor.    Avoiding left heart cath/contrast if possible due to CKD 3. Consider outpatient Myoview   3.  Hypertension Continue hydralazine 50 mg 3 times daily, Imdur 60 mg daily., carvedilol 12.5 mg bid.   losartan 100 mg daily, Off nitro infusion, amlodipine on hold   3.  History of lone A-fib. -S/p ILR, no evidence for A-fib recurrence -Xarelto previously stopped by EP. -Remote monitoring device at home not working.  Will need inpatient download -Outpatient follow-up with EP/A-fib clinic clinic.     Total encounter time more than 50 minutes  Greater than 50% was spent in counseling and coordination of care with the patient   For questions or updates, please contact [place] Please consult [email or web address] for contact info under  Signed, Esmond Plants, MD, Ph.D [facility]

## 2022-11-20 NOTE — ED Notes (Signed)
Nurse in room to introduce herself. Patient AO x4, patient denies any pain at this time, states, "I feel good today", patient denies any needs at this time.

## 2022-11-24 LAB — CULTURE, BLOOD (ROUTINE X 2)
Culture: NO GROWTH
Culture: NO GROWTH
Special Requests: ADEQUATE

## 2023-06-30 ENCOUNTER — Telehealth: Payer: Self-pay | Admitting: Internal Medicine

## 2023-06-30 NOTE — Telephone Encounter (Signed)
Patient stated he wants to change from Dr. Graciela Husbands due to loop recorder issue to Dr. Lalla Brothers. Marland Kitchen

## 2023-09-29 DIAGNOSIS — M79604 Pain in right leg: Secondary | ICD-10-CM | POA: Diagnosis not present

## 2023-09-29 DIAGNOSIS — R2689 Other abnormalities of gait and mobility: Secondary | ICD-10-CM | POA: Diagnosis not present

## 2023-09-29 DIAGNOSIS — E1142 Type 2 diabetes mellitus with diabetic polyneuropathy: Secondary | ICD-10-CM | POA: Diagnosis not present

## 2023-09-29 DIAGNOSIS — R200 Anesthesia of skin: Secondary | ICD-10-CM | POA: Diagnosis not present

## 2023-09-29 DIAGNOSIS — R202 Paresthesia of skin: Secondary | ICD-10-CM | POA: Diagnosis not present

## 2023-10-10 ENCOUNTER — Emergency Department: Payer: HMO

## 2023-10-10 ENCOUNTER — Emergency Department
Admission: EM | Admit: 2023-10-10 | Discharge: 2023-10-10 | Disposition: A | Discharge status: Home / Self Care | Payer: HMO | Attending: Emergency Medicine | Admitting: Emergency Medicine

## 2023-10-10 ENCOUNTER — Other Ambulatory Visit: Payer: Self-pay

## 2023-10-10 DIAGNOSIS — I130 Hypertensive heart and chronic kidney disease with heart failure and stage 1 through stage 4 chronic kidney disease, or unspecified chronic kidney disease: Secondary | ICD-10-CM | POA: Insufficient documentation

## 2023-10-10 DIAGNOSIS — I509 Heart failure, unspecified: Secondary | ICD-10-CM | POA: Insufficient documentation

## 2023-10-10 DIAGNOSIS — N189 Chronic kidney disease, unspecified: Secondary | ICD-10-CM | POA: Insufficient documentation

## 2023-10-10 DIAGNOSIS — M25551 Pain in right hip: Secondary | ICD-10-CM | POA: Diagnosis not present

## 2023-10-10 MED ORDER — KETOROLAC TROMETHAMINE 15 MG/ML IJ SOLN
15.0000 mg | Freq: Once | INTRAMUSCULAR | Status: AC
  Administered 2023-10-10: 15 mg via INTRAMUSCULAR
  Filled 2023-10-10: qty 1

## 2023-10-10 MED ORDER — HYDROCODONE-ACETAMINOPHEN 5-325 MG PO TABS
1.0000 | ORAL_TABLET | Freq: Once | ORAL | Status: AC
  Administered 2023-10-10: 1 via ORAL
  Filled 2023-10-10: qty 1

## 2023-10-10 MED ORDER — DEXAMETHASONE SODIUM PHOSPHATE 10 MG/ML IJ SOLN
10.0000 mg | Freq: Once | INTRAMUSCULAR | Status: AC
  Administered 2023-10-10: 10 mg via INTRAMUSCULAR
  Filled 2023-10-10: qty 1

## 2023-10-10 MED ORDER — HYDROCODONE-ACETAMINOPHEN 5-325 MG PO TABS: 1.0000 | ORAL_TABLET | Freq: Four times a day (QID) | ORAL | 0 refills | Status: AC | PRN

## 2023-10-10 NOTE — ED Provider Triage Note (Signed)
Emergency Medicine Provider Triage Evaluation Note  Steve Buck. , a 68 y.o. male  was evaluated in triage.  Pt complains of right sided low back pain that radiates down the back of his leg. No weakness. No urinary/fecal incontinence or retention. No saddle anesthesia. No abd pain. No n/v/d, fever.  Review of Systems  Positive: Hip pain/back pain Negative: Weakness, urinary/fecal incontinence, retention, saddle anesthesia, no abd pain, no n/v/d, fever  Physical Exam  There were no vitals taken for this visit. Gen:   Awake, no distress   Resp:  Normal effort  MSK:   Moves extremities without difficulty  Other:  ambulatory  Medical Decision Making  Medically screening exam initiated at 3:39 PM.  Appropriate orders placed.  Favor Steve Buck. was informed that the remainder of the evaluation will be completed by another provider, this initial triage assessment does not replace that evaluation, and the importance of remaining in the ED until their evaluation is complete.     Jackelyn Hoehn, PA-C 10/10/23 1541

## 2023-10-10 NOTE — Discharge Instructions (Addendum)
You can take the Norco every 6 hours as needed for severe pain.  For mild to moderate pain you can have 650 mg of Tylenol every 6 hours.  Please continue to apply muscle creams as well as ice and heat.  Please follow-up with the orthopedic provider whose information is attached.  Return to the ED if you have any new or worsening symptoms like fever with hip pain, leg numbness, leg weakness so severe you cannot walk.

## 2023-10-10 NOTE — ED Notes (Signed)
Patient prefers to stand due to discomfort while sitting.

## 2023-10-10 NOTE — ED Triage Notes (Signed)
Pt reports right hip pain that radiates to right lower leg. Pt denies any injuries pain started today. Reports not able to sit, standing and walking helps. Pt talks in complete sentences no respiratory distress noted

## 2023-10-10 NOTE — ED Provider Notes (Signed)
[facility] Provider Note    Event Date/Time   First MD Initiated Contact with Patient 10/10/23 1810     (approximate)   History   Hip Pain   HPI  Steve Jewel. is a 68 y.o. male with PMH of HTN, diabetes, heart failure, CKD presents for evaluation of right hip pain.  Patient states that the pain began this morning.  He tried taking some Tylenol and thought he would be able to wait till Monday to get an appointment but states that the pain is too severe.  Pain starts in the right hip and radiates all the way down his leg to his ankle.  He has some pain relief when standing and laying down but cannot sit as this makes the pain worse.  No specific trauma or injury.  No changes in bladder or bowel function, no saddle anesthesia.      Physical Exam   Triage Vital Signs: ED Triage Vitals  Encounter Vitals Group     BP 10/10/23 1543 (!) 180/115     Systolic BP Percentile --      Diastolic BP Percentile --      Pulse Rate 10/10/23 1543 80     Resp 10/10/23 1543 16     Temp 10/10/23 1543 98.1 F (36.7 C)     Temp Source 10/10/23 1543 Oral     SpO2 10/10/23 1543 94 %     Weight 10/10/23 1544 275 lb (124.7 kg)     Height 10/10/23 1544 6\' 1"  (1.854 m)     Head Circumference --      Peak Flow --      Pain Score 10/10/23 1544 7     Pain Loc --      Pain Education --      Exclude from Growth Chart --     Most recent vital signs: Vitals:   10/10/23 1543  BP: (!) 180/115  Pulse: 80  Resp: 16  Temp: 98.1 F (36.7 C)  SpO2: 94%    General: Awake, no distress.  CV:  Good peripheral perfusion. RRR. Resp:  Normal effort. CTAB. Abd:  No distention.  Other:  No tenderness to palpation over the spine or hip joint, hip ROM maintained, hip flexion does elicit some pain, walks without difficulty, sensation intact across all dermatomes.   ED Results / Procedures / Treatments   Labs (all labs ordered are listed, but only abnormal results are  displayed) Labs Reviewed - No data to display  RADIOLOGY  Right hip x-ray and lumbar spine x-rays obtained, interpreted the images as well as reviewed the radiologist report which did not show any acute bony abnormalities.  PROCEDURES:  Critical Care performed: No  Procedures   MEDICATIONS ORDERED IN ED: Medications  ketorolac (TORADOL) 15 MG/ML injection 15 mg (has no administration in time range)  dexamethasone (DECADRON) injection 10 mg (has no administration in time range)  HYDROcodone-acetaminophen (NORCO/VICODIN) 5-325 MG per tablet 1 tablet (has no administration in time range)     IMPRESSION / MDM / ASSESSMENT AND PLAN / ED COURSE  I reviewed the triage vital signs and the nursing notes.                             68 year old male presents for evaluation of right hip pain.  Patient was hypertensive in triage otherwise vital signs are stable.  He does have a history of hypertension  and is in moderate amount of pain on exam.  He took his blood pressure meds as prescribed this morning and feels his blood pressure is elevated due to his pain level.  Differential diagnosis includes, but is not limited to, hip fracture, hip dislocation, osteoarthritis, trochanteric bursitis, sciatica, cauda equina.  Patient's presentation is most consistent with acute complicated illness / injury requiring diagnostic workup.  X-rays of the right hip and lumbar spine were both negative.  Physical exam is reassuring.  I am unsure of patient's exact cause of his pain.  I feel there may be a component of arthritis and sciatica.  Patient was treated with anti-inflammatories in the ED.  We discussed taking Tylenol, using muscle creams, ice and heat at home.  I will also send him a short course of some Norco.  He was advised to follow-up with orthopedics next week.  He voiced understanding, all questions were answered and he was stable at discharge.     FINAL CLINICAL IMPRESSION(S) / ED DIAGNOSES    Final diagnoses:  Right hip pain     Rx / DC Orders   ED Discharge Orders          Ordered    HYDROcodone-acetaminophen (NORCO/VICODIN) 5-325 MG tablet  Every 6 hours PRN        10/10/23 1841             Note:  This document was prepared using Dragon voice recognition software and may include unintentional dictation errors.   Cameron Ali, PA-C 10/10/23 1843    Trinna Post, MD 10/10/23 2020

## 2023-10-14 DIAGNOSIS — M5441 Lumbago with sciatica, right side: Secondary | ICD-10-CM | POA: Diagnosis not present

## 2023-10-14 DIAGNOSIS — E1122 Type 2 diabetes mellitus with diabetic chronic kidney disease: Secondary | ICD-10-CM | POA: Diagnosis not present

## 2023-10-14 DIAGNOSIS — M4807 Spinal stenosis, lumbosacral region: Secondary | ICD-10-CM | POA: Diagnosis not present

## 2023-10-14 DIAGNOSIS — N183 Chronic kidney disease, stage 3 unspecified: Secondary | ICD-10-CM | POA: Diagnosis not present

## 2023-10-14 DIAGNOSIS — M5416 Radiculopathy, lumbar region: Secondary | ICD-10-CM | POA: Diagnosis not present

## 2023-10-14 DIAGNOSIS — E669 Obesity, unspecified: Secondary | ICD-10-CM | POA: Diagnosis not present

## 2023-10-14 DIAGNOSIS — M51362 Other intervertebral disc degeneration, lumbar region with discogenic back pain and lower extremity pain: Secondary | ICD-10-CM | POA: Diagnosis not present

## 2023-10-19 ENCOUNTER — Other Ambulatory Visit: Payer: Self-pay | Admitting: Orthopedic Surgery

## 2023-10-19 DIAGNOSIS — M5416 Radiculopathy, lumbar region: Secondary | ICD-10-CM

## 2023-10-19 DIAGNOSIS — M51362 Other intervertebral disc degeneration, lumbar region with discogenic back pain and lower extremity pain: Secondary | ICD-10-CM

## 2023-10-19 DIAGNOSIS — M5441 Lumbago with sciatica, right side: Secondary | ICD-10-CM

## 2023-10-19 DIAGNOSIS — M4807 Spinal stenosis, lumbosacral region: Secondary | ICD-10-CM

## 2023-10-27 ENCOUNTER — Ambulatory Visit
Admission: RE | Admit: 2023-10-27 | Discharge: 2023-10-27 | Disposition: A | Discharge status: Home / Self Care | Payer: HMO | Source: Ambulatory Visit | Attending: Orthopedic Surgery | Admitting: Orthopedic Surgery

## 2023-10-27 DIAGNOSIS — M4807 Spinal stenosis, lumbosacral region: Secondary | ICD-10-CM | POA: Diagnosis not present

## 2023-10-27 DIAGNOSIS — M51362 Other intervertebral disc degeneration, lumbar region with discogenic back pain and lower extremity pain: Secondary | ICD-10-CM | POA: Diagnosis not present

## 2023-10-27 DIAGNOSIS — M5441 Lumbago with sciatica, right side: Secondary | ICD-10-CM | POA: Diagnosis not present

## 2023-10-27 DIAGNOSIS — M5416 Radiculopathy, lumbar region: Secondary | ICD-10-CM

## 2023-11-26 DIAGNOSIS — E1122 Type 2 diabetes mellitus with diabetic chronic kidney disease: Secondary | ICD-10-CM | POA: Diagnosis not present

## 2023-11-26 DIAGNOSIS — N183 Chronic kidney disease, stage 3 unspecified: Secondary | ICD-10-CM | POA: Diagnosis not present

## 2023-11-26 DIAGNOSIS — M5416 Radiculopathy, lumbar region: Secondary | ICD-10-CM | POA: Diagnosis not present

## 2023-12-09 DIAGNOSIS — M5416 Radiculopathy, lumbar region: Secondary | ICD-10-CM | POA: Diagnosis not present

## 2023-12-09 DIAGNOSIS — G8929 Other chronic pain: Secondary | ICD-10-CM | POA: Diagnosis not present

## 2023-12-09 DIAGNOSIS — M5441 Lumbago with sciatica, right side: Secondary | ICD-10-CM | POA: Diagnosis not present

## 2023-12-09 DIAGNOSIS — M25562 Pain in left knee: Secondary | ICD-10-CM | POA: Diagnosis not present

## 2023-12-09 DIAGNOSIS — M25561 Pain in right knee: Secondary | ICD-10-CM | POA: Diagnosis not present

## 2023-12-23 DIAGNOSIS — E1122 Type 2 diabetes mellitus with diabetic chronic kidney disease: Secondary | ICD-10-CM | POA: Diagnosis not present

## 2023-12-23 DIAGNOSIS — E1142 Type 2 diabetes mellitus with diabetic polyneuropathy: Secondary | ICD-10-CM | POA: Diagnosis not present

## 2023-12-23 DIAGNOSIS — N183 Chronic kidney disease, stage 3 unspecified: Secondary | ICD-10-CM | POA: Diagnosis not present

## 2023-12-23 DIAGNOSIS — I5032 Chronic diastolic (congestive) heart failure: Secondary | ICD-10-CM | POA: Diagnosis not present

## 2023-12-30 DIAGNOSIS — R110 Nausea: Secondary | ICD-10-CM | POA: Diagnosis not present

## 2023-12-30 DIAGNOSIS — N1832 Chronic kidney disease, stage 3b: Secondary | ICD-10-CM | POA: Diagnosis not present

## 2023-12-30 DIAGNOSIS — E1122 Type 2 diabetes mellitus with diabetic chronic kidney disease: Secondary | ICD-10-CM | POA: Diagnosis not present

## 2023-12-30 DIAGNOSIS — N183 Chronic kidney disease, stage 3 unspecified: Secondary | ICD-10-CM | POA: Diagnosis not present

## 2023-12-30 DIAGNOSIS — I5032 Chronic diastolic (congestive) heart failure: Secondary | ICD-10-CM | POA: Diagnosis not present

## 2023-12-30 DIAGNOSIS — I480 Paroxysmal atrial fibrillation: Secondary | ICD-10-CM | POA: Diagnosis not present

## 2024-01-04 DIAGNOSIS — M25561 Pain in right knee: Secondary | ICD-10-CM | POA: Diagnosis not present

## 2024-01-04 DIAGNOSIS — M170 Bilateral primary osteoarthritis of knee: Secondary | ICD-10-CM | POA: Diagnosis not present

## 2024-01-04 DIAGNOSIS — I480 Paroxysmal atrial fibrillation: Secondary | ICD-10-CM | POA: Diagnosis not present

## 2024-01-04 DIAGNOSIS — M25562 Pain in left knee: Secondary | ICD-10-CM | POA: Diagnosis not present

## 2024-01-05 ENCOUNTER — Emergency Department: Admission: EM | Admit: 2024-01-05 | Discharge: 2024-01-05 | Disposition: A | Discharge status: Home / Self Care

## 2024-01-05 ENCOUNTER — Other Ambulatory Visit: Payer: Self-pay

## 2024-01-05 ENCOUNTER — Encounter: Payer: Self-pay | Admitting: Emergency Medicine

## 2024-01-05 DIAGNOSIS — R519 Headache, unspecified: Secondary | ICD-10-CM | POA: Diagnosis present

## 2024-01-05 DIAGNOSIS — R110 Nausea: Secondary | ICD-10-CM

## 2024-01-05 DIAGNOSIS — N189 Chronic kidney disease, unspecified: Secondary | ICD-10-CM | POA: Insufficient documentation

## 2024-01-05 DIAGNOSIS — E1122 Type 2 diabetes mellitus with diabetic chronic kidney disease: Secondary | ICD-10-CM | POA: Insufficient documentation

## 2024-01-05 DIAGNOSIS — I130 Hypertensive heart and chronic kidney disease with heart failure and stage 1 through stage 4 chronic kidney disease, or unspecified chronic kidney disease: Secondary | ICD-10-CM | POA: Insufficient documentation

## 2024-01-05 DIAGNOSIS — I509 Heart failure, unspecified: Secondary | ICD-10-CM | POA: Insufficient documentation

## 2024-01-05 DIAGNOSIS — I10 Essential (primary) hypertension: Secondary | ICD-10-CM | POA: Diagnosis not present

## 2024-01-05 LAB — CBC WITH DIFFERENTIAL/PLATELET
Abs Immature Granulocytes: 0.04 10*3/uL (ref 0.00–0.07)
Basophils Absolute: 0 10*3/uL (ref 0.0–0.1)
Basophils Relative: 0 %
Eosinophils Absolute: 0.3 10*3/uL (ref 0.0–0.5)
Eosinophils Relative: 3 %
HCT: 40.7 % (ref 39.0–52.0)
Hemoglobin: 14 g/dL (ref 13.0–17.0)
Immature Granulocytes: 0 %
Lymphocytes Relative: 31 %
Lymphs Abs: 3.1 10*3/uL (ref 0.7–4.0)
MCH: 33 pg (ref 26.0–34.0)
MCHC: 34.4 g/dL (ref 30.0–36.0)
MCV: 96 fL (ref 80.0–100.0)
Monocytes Absolute: 0.7 10*3/uL (ref 0.1–1.0)
Monocytes Relative: 7 %
Neutro Abs: 5.8 10*3/uL (ref 1.7–7.7)
Neutrophils Relative %: 59 %
Platelets: 211 10*3/uL (ref 150–400)
RBC: 4.24 MIL/uL (ref 4.22–5.81)
RDW: 14.2 % (ref 11.5–15.5)
WBC: 10 10*3/uL (ref 4.0–10.5)
nRBC: 0 % (ref 0.0–0.2)

## 2024-01-05 LAB — TROPONIN I (HIGH SENSITIVITY)
Troponin I (High Sensitivity): 39 ng/L — ABNORMAL HIGH (ref <18)
Troponin I (High Sensitivity): 47 ng/L — ABNORMAL HIGH (ref <18)

## 2024-01-05 LAB — BASIC METABOLIC PANEL WITH GFR
Anion gap: 12 (ref 5–15)
BUN: 34 mg/dL — ABNORMAL HIGH (ref 8–23)
CO2: 24 mmol/L (ref 22–32)
Calcium: 9.3 mg/dL (ref 8.9–10.3)
Chloride: 106 mmol/L (ref 98–111)
Creatinine, Ser: 1.7 mg/dL — ABNORMAL HIGH (ref 0.61–1.24)
GFR, Estimated: 43 mL/min — ABNORMAL LOW (ref >60)
Glucose, Bld: 93 mg/dL (ref 70–99)
Potassium: 3.6 mmol/L (ref 3.5–5.1)
Sodium: 142 mmol/L (ref 135–145)

## 2024-01-05 MED ORDER — ONDANSETRON HCL 4 MG/2ML IJ SOLN
4.0000 mg | Freq: Once | INTRAMUSCULAR | Status: AC
  Administered 2024-01-05: 4 mg via INTRAVENOUS
  Filled 2024-01-05: qty 2

## 2024-01-05 MED ORDER — ONDANSETRON 4 MG PO TBDP: 4.0000 mg | ORAL_TABLET | Freq: Three times a day (TID) | ORAL | 0 refills | Status: AC | PRN

## 2024-01-05 MED ORDER — HYDRALAZINE HCL 50 MG PO TABS
50.0000 mg | ORAL_TABLET | Freq: Once | ORAL | Status: DC
  Filled 2024-01-05: qty 1

## 2024-01-05 MED ORDER — HYDRALAZINE HCL 50 MG PO TABS: 50.0000 mg | ORAL_TABLET | Freq: Two times a day (BID) | ORAL | 0 refills | Status: AC

## 2024-01-05 MED ORDER — ONDANSETRON HCL 4 MG/2ML IJ SOLN
4.0000 mg | Freq: Once | INTRAMUSCULAR | Status: DC
  Filled 2024-01-05: qty 2

## 2024-01-05 NOTE — ED Notes (Signed)
 Pt used call light to call out for assistance. This tech answered call light. Pt up to bathroom and was placed back on cardiac monitor. Call light within reach. Pt has no further needs at this time.

## 2024-01-05 NOTE — ED Provider Notes (Signed)
 [facility] Provider Note    Event Date/Time   First MD Initiated Contact with Patient 01/05/24 1807     (approximate)   History   Hypertension  Patient to ED via POV for hypertension. States he was checking it at home today due to being high yesterday at the doctors office. States in the 180's at home today. Took BP meds as prescribed. States having headache and blurred vision.     HPI Steve Buck. is a 68 y.o. male image hypertension, CKD, diabetes, HFpEF, obesity, T2DM presents for evaluation of hypertension -Patient was in clinic the other day, noted to have an elevated blood pressure at that time.  Has been checking at home since then and had 1 reading in the 190s so decided to come to emergency department for eval. -Asymptomatic.  No chest pain, shortness of breath.  Occasionally gets mild intermittent headaches, no headache currently, not worst headache of life.  No focal weakness.  No vision changes or confusion. - Patient does note that he has had a few episodes of dry heaving today.  No vomiting.  No frank abdominal pain.  He is recently being treated for H. pylori.  No black or bloody stools.  Per chart review, patient is on multiple antihypertensives --hydralazine  50 mg 3 times daily, isosorbide  mononitrate 60 mg ER daily, Toprol -XL 25 mg daily, spironolactone 25 mg daily     Physical Exam   Triage Vital Signs: ED Triage Vitals  Encounter Vitals Group     BP 01/05/24 1633 (!) 199/115     Systolic BP Percentile --      Diastolic BP Percentile --      Pulse Rate 01/05/24 1633 75     Resp 01/05/24 1633 17     Temp 01/05/24 1633 98.3 F (36.8 C)     Temp src --      SpO2 01/05/24 1633 95 %     Weight 01/05/24 1632 272 lb (123.4 kg)     Height 01/05/24 1632 6\' 1"  (1.854 m)     Head Circumference --      Peak Flow --      Pain Score 01/05/24 1631 7     Pain Loc --      Pain Education --      Exclude from Growth Chart --      Most recent vital signs: Vitals:   01/05/24 2038 01/05/24 2300  BP: (!) 183/111 (!) 183/111  Pulse: 72 74  Resp: 16 12  Temp:    SpO2: 98% 100%     General: Awake, no distress.  CV:  Good peripheral perfusion. RRR, RP 2+ Resp:  Normal effort. CTAB Abd:  No distention. Nontender to deep palpation throughout Neuro:  Moving all extremities spontaneously, no facial asymmetry, no obvious motor deficit   ED Results / Procedures / Treatments   Labs (all labs ordered are listed, but only abnormal results are displayed) Labs Reviewed  BASIC METABOLIC PANEL WITH GFR - Abnormal; Notable for the following components:      Result Value   BUN 34 (*)    Creatinine, Ser 1.70 (*)    GFR, Estimated 43 (*)    All other components within normal limits  TROPONIN I (HIGH SENSITIVITY) - Abnormal; Notable for the following components:   Troponin I (High Sensitivity) 39 (*)    All other components within normal limits  TROPONIN I (HIGH SENSITIVITY) - Abnormal; Notable for the following components:  Troponin I (High Sensitivity) 47 (*)    All other components within normal limits  CBC WITH DIFFERENTIAL/PLATELET     EKG  See ED course   RADIOLOGY N/a    PROCEDURES:  Critical Care performed: No  Procedures   MEDICATIONS ORDERED IN ED: Medications  hydrALAZINE  (APRESOLINE ) tablet 50 mg (50 mg Oral Patient Refused/Not Given 01/05/24 2340)  ondansetron  (ZOFRAN ) injection 4 mg (4 mg Intravenous Patient Refused/Not Given 01/05/24 2341)  ondansetron  (ZOFRAN ) injection 4 mg (4 mg Intravenous Given 01/05/24 1945)     IMPRESSION / MDM / ASSESSMENT AND PLAN / ED COURSE  I reviewed the triage vital signs and the nursing notes.                              DDX/MDM/AP: Differential diagnosis includes, but is not limited to, asymptomatic hypertension, do not clinically suspect hypertensive emergency at this time.  Screening labs collected at triage, will follow.  Plan: -  Labs EKG - consider increasing blood pressure medication regimen versus close PMD follow-up  Patient's presentation is most consistent with exacerbation of chronic illness.  The patient is on the cardiac monitor to evaluate for evidence of arrhythmia and/or significant heart rate changes.  ED course below.  Workup unremarkable, mild troponin elevation though downtrending from prior and overall stable on repeat.  Nonischemic EKG.  No anginal symptoms here in emergency department.  Presentation overall most consistent with asymptomatic hypertension.  On reeval, patient was complaining more of his nausea, feeling much better after Zofran .    Serial abdominal exams benign, given his recurrent complaint of nausea I did offer to check hepatobiliary labs which patient declined and I believe is very reasonable. Appears he had previously been on hydralazine  but had not taken this for about the past year, restarted him on 50 mg twice daily (was previously 3 times daily) with plan for close PMD follow-up.  Rx Zofran  for use as needed.  Clinical Course as of 01/05/24 2352  Tue Jan 05, 2024  1610 Troponin mildly elevated, significantly downtrending from prior [MM]  1948 BMP reviewed, at baseline, no evidence of renal damage [MM]  1952 Ecg = sinus rhythm, first-degree AV block present, rate 67, no ST elevation or depression, no significant repolarization abnormality.  Left axis deviation present.  No evidence of ischemia nor arrhythmia on my read. [MM]  2207 Rpt troponin change <10, overall stable. Pt remains asx and troponin levels are notably below prior. [MM]  2248 Patient states he has not responded well to high doses of metoprolol  in the past, did have issues with bradycardia.  Tells me he has been prescribed hydralazine  but stopped taking it about a year ago because his blood pressure was well-controlled.  Was previously on 2 mg 3 times daily.  Will start patient back on hydralazine , would like a dose  this evening as well.  Given he has not been on it for some time, we will start him at 50 mg twice daily.  Plan to hold medications if his systolic blood pressure is ever less than 100.  Plan for close PMD follow-up.  ED return precautions in place.  Patient agrees with plan. [MM]    Clinical Course User Index [MM] Collis Deaner, MD     FINAL CLINICAL IMPRESSION(S) / ED DIAGNOSES   Final diagnoses:  Hypertension, unspecified type  Nausea     Rx / DC Orders   ED Discharge Orders  Ordered    hydrALAZINE  (APRESOLINE ) 50 MG tablet  2 times daily        01/05/24 2247    ondansetron  (ZOFRAN -ODT) 4 MG disintegrating tablet  Every 8 hours PRN        01/05/24 2258             Note:  This document was prepared using Dragon voice recognition software and may include unintentional dictation errors.   Collis Deaner, MD 01/05/24 2352

## 2024-01-05 NOTE — ED Triage Notes (Signed)
 Patient to ED via POV for hypertension. States he was checking it at home today due to being high yesterday at the doctors office. States in the 180's at home today. Took BP meds as prescribed. States having headache and blurred vision.

## 2024-01-05 NOTE — Discharge Instructions (Addendum)
 Your evaluation in the emergency department was notable for an elevated blood pressure, but your workup was overall reassuring.  I do recommend we restart you on the hydralazine , I recommend you take it twice daily instead of 3 times daily.  Do not take if your blood pressure top number is less than 100.  I have also prescribed you a nausea pill to help with your intermittent dry heaving.  Please do follow-up closely with your primary care doctor for reevaluation, and return to the emergency department with any new or worsening symptoms.

## 2024-01-07 ENCOUNTER — Other Ambulatory Visit
Admission: RE | Admit: 2024-01-07 | Discharge: 2024-01-07 | Disposition: A | Discharge status: Home / Self Care | Source: Ambulatory Visit | Attending: Internal Medicine | Admitting: Internal Medicine

## 2024-01-07 DIAGNOSIS — R7989 Other specified abnormal findings of blood chemistry: Secondary | ICD-10-CM | POA: Insufficient documentation

## 2024-01-07 LAB — TROPONIN I (HIGH SENSITIVITY): Troponin I (High Sensitivity): 41 ng/L — ABNORMAL HIGH (ref <18)

## 2024-01-08 ENCOUNTER — Other Ambulatory Visit: Payer: Self-pay | Admitting: Internal Medicine

## 2024-01-08 DIAGNOSIS — I10 Essential (primary) hypertension: Secondary | ICD-10-CM | POA: Diagnosis not present

## 2024-01-08 DIAGNOSIS — I480 Paroxysmal atrial fibrillation: Secondary | ICD-10-CM | POA: Diagnosis not present

## 2024-01-08 DIAGNOSIS — I5032 Chronic diastolic (congestive) heart failure: Secondary | ICD-10-CM | POA: Diagnosis not present

## 2024-01-08 DIAGNOSIS — R0602 Shortness of breath: Secondary | ICD-10-CM

## 2024-01-08 DIAGNOSIS — I25118 Atherosclerotic heart disease of native coronary artery with other forms of angina pectoris: Secondary | ICD-10-CM | POA: Diagnosis not present

## 2024-01-08 DIAGNOSIS — I493 Ventricular premature depolarization: Secondary | ICD-10-CM | POA: Diagnosis not present

## 2024-01-08 DIAGNOSIS — Z6835 Body mass index (BMI) 35.0-35.9, adult: Secondary | ICD-10-CM | POA: Diagnosis not present

## 2024-01-08 DIAGNOSIS — E66812 Obesity, class 2: Secondary | ICD-10-CM | POA: Diagnosis not present

## 2024-01-08 DIAGNOSIS — E782 Mixed hyperlipidemia: Secondary | ICD-10-CM | POA: Diagnosis not present

## 2024-01-08 DIAGNOSIS — E1122 Type 2 diabetes mellitus with diabetic chronic kidney disease: Secondary | ICD-10-CM | POA: Diagnosis not present

## 2024-01-08 DIAGNOSIS — N183 Chronic kidney disease, stage 3 unspecified: Secondary | ICD-10-CM | POA: Diagnosis not present

## 2024-01-28 DIAGNOSIS — R0602 Shortness of breath: Secondary | ICD-10-CM | POA: Diagnosis not present

## 2024-02-03 ENCOUNTER — Ambulatory Visit
Admission: RE | Admit: 2024-02-03 | Discharge: 2024-02-03 | Disposition: A | Discharge status: Home / Self Care | Payer: Self-pay | Source: Ambulatory Visit | Attending: Internal Medicine | Admitting: Internal Medicine

## 2024-02-03 DIAGNOSIS — R0602 Shortness of breath: Secondary | ICD-10-CM | POA: Insufficient documentation

## 2024-02-15 ENCOUNTER — Other Ambulatory Visit: Payer: Self-pay | Admitting: Internal Medicine

## 2024-02-15 DIAGNOSIS — R0602 Shortness of breath: Secondary | ICD-10-CM | POA: Diagnosis not present

## 2024-02-15 DIAGNOSIS — E1122 Type 2 diabetes mellitus with diabetic chronic kidney disease: Secondary | ICD-10-CM | POA: Diagnosis not present

## 2024-02-15 DIAGNOSIS — I25118 Atherosclerotic heart disease of native coronary artery with other forms of angina pectoris: Secondary | ICD-10-CM | POA: Diagnosis not present

## 2024-02-15 DIAGNOSIS — I480 Paroxysmal atrial fibrillation: Secondary | ICD-10-CM | POA: Diagnosis not present

## 2024-02-15 DIAGNOSIS — E782 Mixed hyperlipidemia: Secondary | ICD-10-CM | POA: Diagnosis not present

## 2024-02-15 DIAGNOSIS — I2089 Other forms of angina pectoris: Secondary | ICD-10-CM | POA: Diagnosis not present

## 2024-02-15 DIAGNOSIS — I493 Ventricular premature depolarization: Secondary | ICD-10-CM | POA: Diagnosis not present

## 2024-02-15 DIAGNOSIS — N183 Chronic kidney disease, stage 3 unspecified: Secondary | ICD-10-CM | POA: Diagnosis not present

## 2024-02-15 DIAGNOSIS — I10 Essential (primary) hypertension: Secondary | ICD-10-CM | POA: Diagnosis not present

## 2024-02-15 DIAGNOSIS — E66812 Obesity, class 2: Secondary | ICD-10-CM | POA: Diagnosis not present

## 2024-02-15 DIAGNOSIS — I5032 Chronic diastolic (congestive) heart failure: Secondary | ICD-10-CM | POA: Diagnosis not present

## 2024-03-08 ENCOUNTER — Ambulatory Visit: Admission: RE | Admit: 2024-03-08 | Source: Ambulatory Visit

## 2024-03-09 ENCOUNTER — Other Ambulatory Visit: Payer: Self-pay | Admitting: Internal Medicine

## 2024-03-09 DIAGNOSIS — I2089 Other forms of angina pectoris: Secondary | ICD-10-CM

## 2024-03-15 ENCOUNTER — Other Ambulatory Visit: Payer: Self-pay | Admitting: Internal Medicine

## 2024-03-15 DIAGNOSIS — R931 Abnormal findings on diagnostic imaging of heart and coronary circulation: Secondary | ICD-10-CM

## 2024-03-28 ENCOUNTER — Encounter (HOSPITAL_COMMUNITY): Payer: Self-pay

## 2024-04-01 ENCOUNTER — Other Ambulatory Visit

## 2024-06-09 ENCOUNTER — Other Ambulatory Visit: Payer: Self-pay | Admitting: Internal Medicine

## 2024-06-09 DIAGNOSIS — R931 Abnormal findings on diagnostic imaging of heart and coronary circulation: Secondary | ICD-10-CM

## 2024-06-24 ENCOUNTER — Telehealth (HOSPITAL_COMMUNITY): Payer: Self-pay | Admitting: Emergency Medicine

## 2024-06-24 ENCOUNTER — Encounter (HOSPITAL_COMMUNITY): Payer: Self-pay

## 2024-06-24 NOTE — Telephone Encounter (Signed)
 Attempted to call patient regarding upcoming cardiac CT appointment. Left message on voicemail with name and callback number Rockwell Alexandria RN Navigator Cardiac Imaging [facility] [phone] Office [phone] Cell

## 2024-06-27 ENCOUNTER — Ambulatory Visit

## 2024-06-28 DIAGNOSIS — I480 Paroxysmal atrial fibrillation: Secondary | ICD-10-CM | POA: Diagnosis not present

## 2024-06-28 DIAGNOSIS — Z125 Encounter for screening for malignant neoplasm of prostate: Secondary | ICD-10-CM | POA: Diagnosis not present

## 2024-06-28 DIAGNOSIS — I5032 Chronic diastolic (congestive) heart failure: Secondary | ICD-10-CM | POA: Diagnosis not present

## 2024-06-28 DIAGNOSIS — E1122 Type 2 diabetes mellitus with diabetic chronic kidney disease: Secondary | ICD-10-CM | POA: Diagnosis not present

## 2024-06-28 DIAGNOSIS — N183 Chronic kidney disease, stage 3 unspecified: Secondary | ICD-10-CM | POA: Diagnosis not present

## 2024-07-05 DIAGNOSIS — E1142 Type 2 diabetes mellitus with diabetic polyneuropathy: Secondary | ICD-10-CM | POA: Diagnosis not present

## 2024-07-05 DIAGNOSIS — Z23 Encounter for immunization: Secondary | ICD-10-CM | POA: Diagnosis not present

## 2024-07-05 DIAGNOSIS — I10 Essential (primary) hypertension: Secondary | ICD-10-CM | POA: Diagnosis not present

## 2024-07-05 DIAGNOSIS — E1122 Type 2 diabetes mellitus with diabetic chronic kidney disease: Secondary | ICD-10-CM | POA: Diagnosis not present

## 2024-07-05 DIAGNOSIS — I25118 Atherosclerotic heart disease of native coronary artery with other forms of angina pectoris: Secondary | ICD-10-CM | POA: Diagnosis not present

## 2024-07-05 DIAGNOSIS — E119 Type 2 diabetes mellitus without complications: Secondary | ICD-10-CM | POA: Diagnosis not present

## 2024-07-05 DIAGNOSIS — N183 Chronic kidney disease, stage 3 unspecified: Secondary | ICD-10-CM | POA: Diagnosis not present

## 2024-07-05 DIAGNOSIS — Z1331 Encounter for screening for depression: Secondary | ICD-10-CM | POA: Diagnosis not present

## 2024-07-05 DIAGNOSIS — Z0000 Encounter for general adult medical examination without abnormal findings: Secondary | ICD-10-CM | POA: Diagnosis not present

## 2024-07-05 DIAGNOSIS — I480 Paroxysmal atrial fibrillation: Secondary | ICD-10-CM | POA: Diagnosis not present

## 2024-07-05 DIAGNOSIS — I5032 Chronic diastolic (congestive) heart failure: Secondary | ICD-10-CM | POA: Diagnosis not present
